# Patient Record
Sex: Female | Born: 1965 | Race: Black or African American | Hispanic: No | Smoking: Never smoker
Health system: Southern US, Community
[De-identification: ages and names within clinical notes are randomized; demographics above are authoritative.]

## PROBLEM LIST (undated history)

## (undated) DIAGNOSIS — I639 Cerebral infarction, unspecified: Secondary | ICD-10-CM

## (undated) DIAGNOSIS — M5136 Other intervertebral disc degeneration, lumbar region: Secondary | ICD-10-CM

## (undated) DIAGNOSIS — I341 Nonrheumatic mitral (valve) prolapse: Secondary | ICD-10-CM

## (undated) DIAGNOSIS — Q211 Atrial septal defect: Secondary | ICD-10-CM

## (undated) DIAGNOSIS — R51 Headache: Secondary | ICD-10-CM

## (undated) DIAGNOSIS — R42 Dizziness and giddiness: Secondary | ICD-10-CM

## (undated) DIAGNOSIS — K458 Other specified abdominal hernia without obstruction or gangrene: Secondary | ICD-10-CM

## (undated) DIAGNOSIS — M5030 Other cervical disc degeneration, unspecified cervical region: Secondary | ICD-10-CM

## (undated) DIAGNOSIS — M722 Plantar fascial fibromatosis: Secondary | ICD-10-CM

## (undated) DIAGNOSIS — R519 Headache, unspecified: Secondary | ICD-10-CM

## (undated) DIAGNOSIS — I219 Acute myocardial infarction, unspecified: Secondary | ICD-10-CM

## (undated) DIAGNOSIS — D573 Sickle-cell trait: Secondary | ICD-10-CM

## (undated) DIAGNOSIS — T753XXA Motion sickness, initial encounter: Secondary | ICD-10-CM

## (undated) DIAGNOSIS — R0789 Other chest pain: Secondary | ICD-10-CM

## (undated) DIAGNOSIS — K562 Volvulus: Secondary | ICD-10-CM

## (undated) DIAGNOSIS — M1990 Unspecified osteoarthritis, unspecified site: Secondary | ICD-10-CM

## (undated) DIAGNOSIS — R109 Unspecified abdominal pain: Secondary | ICD-10-CM

## (undated) DIAGNOSIS — D649 Anemia, unspecified: Secondary | ICD-10-CM

## (undated) DIAGNOSIS — N83209 Unspecified ovarian cyst, unspecified side: Secondary | ICD-10-CM

## (undated) HISTORY — DX: Other cervical disc degeneration, unspecified cervical region: M50.30

## (undated) HISTORY — DX: Dizziness and giddiness: R42

## (undated) HISTORY — DX: Volvulus: K56.2

## (undated) HISTORY — DX: Other chest pain: R07.89

## (undated) HISTORY — DX: Other intervertebral disc degeneration, lumbar region: M51.36

## (undated) HISTORY — PX: OTHER SURGICAL HISTORY: SHX169

## (undated) HISTORY — DX: Unspecified ovarian cyst, unspecified side: N83.209

## (undated) HISTORY — DX: Atrial septal defect: Q21.1

## (undated) HISTORY — PX: CARDIAC CATHETERIZATION: SHX172

## (undated) HISTORY — DX: Other specified abdominal hernia without obstruction or gangrene: K45.8

## (undated) HISTORY — DX: Unspecified abdominal pain: R10.9

---

## 2011-06-17 ENCOUNTER — Emergency Department: Payer: Self-pay | Admitting: Emergency Medicine

## 2011-06-17 LAB — PREGNANCY, URINE: Pregnancy Test, Urine: NEGATIVE m[IU]/mL

## 2011-11-18 ENCOUNTER — Emergency Department: Payer: Self-pay | Admitting: Emergency Medicine

## 2012-06-08 HISTORY — PX: ENDOMETRIAL ABLATION: SHX621

## 2012-09-28 ENCOUNTER — Emergency Department: Payer: Self-pay | Admitting: Emergency Medicine

## 2012-11-23 ENCOUNTER — Ambulatory Visit: Payer: Self-pay | Admitting: Family Medicine

## 2013-02-28 ENCOUNTER — Ambulatory Visit: Payer: Self-pay | Admitting: Hematology and Oncology

## 2013-02-28 LAB — CBC CANCER CENTER
Basophil %: 0.5 %
Eosinophil #: 0.1 x10 3/mm (ref 0.0–0.7)
Eosinophil %: 2.3 %
Lymphocyte %: 31.5 %
MCV: 71 fL — ABNORMAL LOW (ref 80–100)
Monocyte #: 0.3 x10 3/mm (ref 0.2–0.9)
Monocyte %: 8.4 %
Neutrophil #: 2.2 x10 3/mm (ref 1.4–6.5)
Platelet: 245 x10 3/mm (ref 150–440)
RDW: 19.2 % — ABNORMAL HIGH (ref 11.5–14.5)
WBC: 3.9 x10 3/mm (ref 3.6–11.0)

## 2013-02-28 LAB — IRON AND TIBC
Iron Bind.Cap.(Total): 448 ug/dL (ref 250–450)
Iron Saturation: 6 %
Unbound Iron-Bind.Cap.: 423 ug/dL

## 2013-02-28 LAB — BASIC METABOLIC PANEL
Anion Gap: 2 — ABNORMAL LOW (ref 7–16)
Calcium, Total: 8.6 mg/dL (ref 8.5–10.1)
Co2: 29 mmol/L (ref 21–32)
EGFR (African American): >60
EGFR (Non-African Amer.): >60
Glucose: 82 mg/dL (ref 65–99)
Potassium: 4 mmol/L (ref 3.5–5.1)
Sodium: 137 mmol/L (ref 136–145)

## 2013-03-08 ENCOUNTER — Ambulatory Visit: Payer: Self-pay | Admitting: Hematology and Oncology

## 2013-03-14 LAB — CBC CANCER CENTER
Basophil #: 0 x10 3/mm (ref 0.0–0.1)
Eosinophil #: 0.1 x10 3/mm (ref 0.0–0.7)
Eosinophil %: 1.1 %
HCT: 30.4 % — ABNORMAL LOW (ref 35.0–47.0)
HGB: 9.9 g/dL — ABNORMAL LOW (ref 12.0–16.0)
Lymphocyte #: 1.3 x10 3/mm (ref 1.0–3.6)
MCH: 23.3 pg — ABNORMAL LOW (ref 26.0–34.0)
MCHC: 32.4 g/dL (ref 32.0–36.0)
MCV: 72 fL — ABNORMAL LOW (ref 80–100)
Monocyte #: 0.4 x10 3/mm (ref 0.2–0.9)
Monocyte %: 8.5 %
Neutrophil #: 2.8 x10 3/mm (ref 1.4–6.5)
Platelet: 215 x10 3/mm (ref 150–440)
RBC: 4.22 10*6/uL (ref 3.80–5.20)
RDW: 18.6 % — ABNORMAL HIGH (ref 11.5–14.5)
WBC: 4.6 x10 3/mm (ref 3.6–11.0)

## 2013-04-08 ENCOUNTER — Ambulatory Visit: Payer: Self-pay | Admitting: Hematology and Oncology

## 2013-04-12 LAB — CBC CANCER CENTER
Basophil #: 0 x10 3/mm (ref 0.0–0.1)
Basophil %: 0.4 %
Eosinophil #: 0 x10 3/mm (ref 0.0–0.7)
Eosinophil %: 1 %
HGB: 11.4 g/dL — ABNORMAL LOW (ref 12.0–16.0)
Lymphocyte #: 1 x10 3/mm (ref 1.0–3.6)
MCH: 23.7 pg — ABNORMAL LOW (ref 26.0–34.0)
MCHC: 31.6 g/dL — ABNORMAL LOW (ref 32.0–36.0)
MCV: 75 fL — ABNORMAL LOW (ref 80–100)
Monocyte %: 7.5 %
Neutrophil #: 2.9 x10 3/mm (ref 1.4–6.5)
Neutrophil %: 68.9 %
Platelet: 166 x10 3/mm (ref 150–440)
RDW: 20.1 % — ABNORMAL HIGH (ref 11.5–14.5)
WBC: 4.3 x10 3/mm (ref 3.6–11.0)

## 2013-04-12 LAB — IRON AND TIBC
Iron Saturation: 37 %
Iron: 106 ug/dL (ref 50–170)

## 2013-04-12 LAB — FERRITIN: Ferritin (ARMC): 194 ng/mL (ref 8–388)

## 2013-05-08 ENCOUNTER — Ambulatory Visit: Payer: Self-pay | Admitting: Hematology and Oncology

## 2013-05-10 ENCOUNTER — Ambulatory Visit: Payer: Self-pay | Admitting: Hematology and Oncology

## 2013-05-10 LAB — CBC CANCER CENTER
Eosinophil %: 1 %
HGB: 12.5 g/dL (ref 12.0–16.0)
Lymphocyte %: 26.8 %
MCH: 24.3 pg — ABNORMAL LOW (ref 26.0–34.0)
MCHC: 31.8 g/dL — ABNORMAL LOW (ref 32.0–36.0)
Monocyte %: 8.8 %
Neutrophil #: 2.9 x10 3/mm (ref 1.4–6.5)
Neutrophil %: 62.9 %
Platelet: 210 x10 3/mm (ref 150–440)
RBC: 5.15 10*6/uL (ref 3.80–5.20)
RDW: 18.1 % — ABNORMAL HIGH (ref 11.5–14.5)
WBC: 4.7 x10 3/mm (ref 3.6–11.0)

## 2013-06-01 ENCOUNTER — Emergency Department: Payer: Self-pay | Admitting: Internal Medicine

## 2013-06-01 LAB — COMPREHENSIVE METABOLIC PANEL
Albumin: 3.5 g/dL (ref 3.4–5.0)
Alkaline Phosphatase: 84 U/L
Calcium, Total: 8.6 mg/dL (ref 8.5–10.1)
Chloride: 105 mmol/L (ref 98–107)
Co2: 30 mmol/L (ref 21–32)
EGFR (African American): >60
Glucose: 94 mg/dL (ref 65–99)
Osmolality: 273 (ref 275–301)
SGOT(AST): 24 U/L (ref 15–37)
SGPT (ALT): 23 U/L (ref 12–78)
Total Protein: 7.5 g/dL (ref 6.4–8.2)

## 2013-06-01 LAB — URINALYSIS, COMPLETE
Bacteria: NONE SEEN
Bilirubin,UR: NEGATIVE
Glucose,UR: NEGATIVE mg/dL (ref 0–75)
Ph: 6 (ref 4.5–8.0)
RBC,UR: 3 /HPF (ref 0–5)
Squamous Epithelial: 7
WBC UR: 32 /HPF (ref 0–5)

## 2013-06-01 LAB — CBC
HCT: 38.6 % (ref 35.0–47.0)
HGB: 12.4 g/dL (ref 12.0–16.0)
MCH: 24.3 pg — ABNORMAL LOW (ref 26.0–34.0)
Platelet: 228 10*3/uL (ref 150–440)
RBC: 5.11 10*6/uL (ref 3.80–5.20)
RDW: 16.3 % — ABNORMAL HIGH (ref 11.5–14.5)

## 2013-06-01 LAB — PROTIME-INR: INR: 0.9

## 2013-06-08 ENCOUNTER — Ambulatory Visit: Payer: Self-pay | Admitting: Hematology and Oncology

## 2013-06-21 ENCOUNTER — Emergency Department: Payer: Self-pay | Admitting: Internal Medicine

## 2013-06-21 LAB — LIPASE, BLOOD: Lipase: 1201 U/L — ABNORMAL HIGH (ref 73–393)

## 2013-06-21 LAB — CBC WITH DIFFERENTIAL/PLATELET
Basophil #: 0 10*3/uL (ref 0.0–0.1)
Basophil %: 0.2 %
Eosinophil #: 0 10*3/uL (ref 0.0–0.7)
Eosinophil %: 0.1 %
HCT: 41.1 % (ref 35.0–47.0)
HGB: 13.2 g/dL (ref 12.0–16.0)
Lymphocyte #: 0.6 10*3/uL — ABNORMAL LOW (ref 1.0–3.6)
Lymphocyte %: 3.9 %
MCH: 24.3 pg — ABNORMAL LOW (ref 26.0–34.0)
MCHC: 32.1 g/dL (ref 32.0–36.0)
MCV: 76 fL — ABNORMAL LOW (ref 80–100)
MONOS PCT: 5.1 %
Monocyte #: 0.8 x10 3/mm (ref 0.2–0.9)
NEUTROS ABS: 14.1 10*3/uL — AB (ref 1.4–6.5)
NEUTROS PCT: 90.7 %
PLATELETS: 175 10*3/uL (ref 150–440)
RBC: 5.43 10*6/uL — AB (ref 3.80–5.20)
RDW: 14.9 % — AB (ref 11.5–14.5)
WBC: 15.5 10*3/uL — ABNORMAL HIGH (ref 3.6–11.0)

## 2013-06-21 LAB — URINALYSIS, COMPLETE
BACTERIA: NONE SEEN
BILIRUBIN, UR: NEGATIVE
Blood: NEGATIVE
GLUCOSE, UR: NEGATIVE mg/dL (ref 0–75)
Ketone: NEGATIVE
LEUKOCYTE ESTERASE: NEGATIVE
NITRITE: NEGATIVE
PH: 5 (ref 4.5–8.0)
Protein: NEGATIVE
Specific Gravity: 1.021 (ref 1.003–1.030)
Squamous Epithelial: <1
WBC UR: 1 /HPF (ref 0–5)

## 2013-06-21 LAB — COMPREHENSIVE METABOLIC PANEL
ANION GAP: 6 — AB (ref 7–16)
AST: 32 U/L (ref 15–37)
Albumin: 3.8 g/dL (ref 3.4–5.0)
Alkaline Phosphatase: 84 U/L
BILIRUBIN TOTAL: 0.3 mg/dL (ref 0.2–1.0)
BUN: 17 mg/dL (ref 7–18)
CALCIUM: 9 mg/dL (ref 8.5–10.1)
CO2: 22 mmol/L (ref 21–32)
Chloride: 107 mmol/L (ref 98–107)
Creatinine: 0.7 mg/dL (ref 0.60–1.30)
EGFR (African American): >60
Glucose: 132 mg/dL — ABNORMAL HIGH (ref 65–99)
Osmolality: 274 (ref 275–301)
Potassium: 4.3 mmol/L (ref 3.5–5.1)
SGPT (ALT): 21 U/L (ref 12–78)
Sodium: 135 mmol/L — ABNORMAL LOW (ref 136–145)
Total Protein: 7.9 g/dL (ref 6.4–8.2)

## 2013-06-21 LAB — RAPID INFLUENZA A&B ANTIGENS

## 2013-08-19 ENCOUNTER — Emergency Department: Payer: Self-pay | Admitting: Emergency Medicine

## 2014-01-01 ENCOUNTER — Emergency Department: Payer: Self-pay | Admitting: Emergency Medicine

## 2014-01-01 LAB — CBC
HCT: 38.2 % (ref 35.0–47.0)
HGB: 12.1 g/dL (ref 12.0–16.0)
MCH: 24 pg — ABNORMAL LOW (ref 26.0–34.0)
MCHC: 31.6 g/dL — ABNORMAL LOW (ref 32.0–36.0)
MCV: 76 fL — ABNORMAL LOW (ref 80–100)
PLATELETS: 198 10*3/uL (ref 150–440)
RBC: 5.04 10*6/uL (ref 3.80–5.20)
RDW: 14.7 % — ABNORMAL HIGH (ref 11.5–14.5)
WBC: 4 10*3/uL (ref 3.6–11.0)

## 2014-01-01 LAB — COMPREHENSIVE METABOLIC PANEL
ANION GAP: 5 — AB (ref 7–16)
Albumin: 3.4 g/dL (ref 3.4–5.0)
Alkaline Phosphatase: 70 U/L
BUN: 9 mg/dL (ref 7–18)
Bilirubin,Total: 0.3 mg/dL (ref 0.2–1.0)
CALCIUM: 8.5 mg/dL (ref 8.5–10.1)
CO2: 28 mmol/L (ref 21–32)
Chloride: 105 mmol/L (ref 98–107)
Creatinine: 0.82 mg/dL (ref 0.60–1.30)
EGFR (African American): >60
EGFR (Non-African Amer.): >60
GLUCOSE: 96 mg/dL (ref 65–99)
OSMOLALITY: 274 (ref 275–301)
POTASSIUM: 3.7 mmol/L (ref 3.5–5.1)
SGOT(AST): 22 U/L (ref 15–37)
SGPT (ALT): 23 U/L
Sodium: 138 mmol/L (ref 136–145)
Total Protein: 7.3 g/dL (ref 6.4–8.2)

## 2014-01-01 LAB — URINALYSIS, COMPLETE
BILIRUBIN, UR: NEGATIVE
Bacteria: NONE SEEN
Blood: NEGATIVE
Glucose,UR: NEGATIVE mg/dL (ref 0–75)
Ketone: NEGATIVE
Nitrite: NEGATIVE
PH: 5 (ref 4.5–8.0)
Protein: NEGATIVE
Specific Gravity: 1.017 (ref 1.003–1.030)
Squamous Epithelial: 6
WBC UR: 5 /HPF (ref 0–5)

## 2014-01-01 LAB — HCG, QUANTITATIVE, PREGNANCY: Beta Hcg, Quant.: <1 m[IU]/mL — ABNORMAL LOW

## 2014-08-11 ENCOUNTER — Emergency Department: Payer: Self-pay | Admitting: Emergency Medicine

## 2014-08-29 ENCOUNTER — Emergency Department: Payer: Self-pay | Admitting: Emergency Medicine

## 2015-04-11 ENCOUNTER — Ambulatory Visit: Payer: Self-pay | Admitting: Podiatry

## 2016-01-28 DIAGNOSIS — Q2111 Secundum atrial septal defect: Secondary | ICD-10-CM

## 2016-01-28 DIAGNOSIS — Q211 Atrial septal defect: Secondary | ICD-10-CM

## 2016-01-28 DIAGNOSIS — R0789 Other chest pain: Secondary | ICD-10-CM

## 2016-01-28 HISTORY — DX: Secundum atrial septal defect: Q21.11

## 2016-01-28 HISTORY — DX: Atrial septal defect: Q21.1

## 2016-01-28 HISTORY — DX: Other chest pain: R07.89

## 2016-06-26 ENCOUNTER — Encounter: Payer: Self-pay | Admitting: Emergency Medicine

## 2016-06-26 DIAGNOSIS — R112 Nausea with vomiting, unspecified: Secondary | ICD-10-CM | POA: Diagnosis present

## 2016-06-26 DIAGNOSIS — K859 Acute pancreatitis without necrosis or infection, unspecified: Secondary | ICD-10-CM | POA: Diagnosis not present

## 2016-06-26 LAB — CBC
HCT: 39.2 % (ref 35.0–47.0)
HEMOGLOBIN: 12.5 g/dL (ref 12.0–16.0)
MCH: 23.6 pg — AB (ref 26.0–34.0)
MCHC: 31.9 g/dL — AB (ref 32.0–36.0)
MCV: 74 fL — ABNORMAL LOW (ref 80.0–100.0)
Platelets: 236 10*3/uL (ref 150–440)
RBC: 5.3 MIL/uL — ABNORMAL HIGH (ref 3.80–5.20)
RDW: 15.3 % — ABNORMAL HIGH (ref 11.5–14.5)
WBC: 11.4 10*3/uL — ABNORMAL HIGH (ref 3.6–11.0)

## 2016-06-26 LAB — COMPREHENSIVE METABOLIC PANEL
ALBUMIN: 4.1 g/dL (ref 3.5–5.0)
ALK PHOS: 87 U/L (ref 38–126)
ALT: 16 U/L (ref 14–54)
ANION GAP: 6 (ref 5–15)
AST: 19 U/L (ref 15–41)
BUN: 15 mg/dL (ref 6–20)
CALCIUM: 9 mg/dL (ref 8.9–10.3)
CHLORIDE: 108 mmol/L (ref 101–111)
CO2: 24 mmol/L (ref 22–32)
Creatinine, Ser: 0.6 mg/dL (ref 0.44–1.00)
GFR calc Af Amer: >60 mL/min (ref >60)
GFR calc non Af Amer: >60 mL/min (ref >60)
GLUCOSE: 133 mg/dL — AB (ref 65–99)
Potassium: 3.8 mmol/L (ref 3.5–5.1)
SODIUM: 138 mmol/L (ref 135–145)
Total Bilirubin: 0.4 mg/dL (ref 0.3–1.2)
Total Protein: 7.7 g/dL (ref 6.5–8.1)

## 2016-06-26 LAB — URINALYSIS, COMPLETE (UACMP) WITH MICROSCOPIC
Bilirubin Urine: NEGATIVE
GLUCOSE, UA: NEGATIVE mg/dL
KETONES UR: NEGATIVE mg/dL
Leukocytes, UA: NEGATIVE
Nitrite: NEGATIVE
PROTEIN: NEGATIVE mg/dL
Specific Gravity, Urine: 1.029 (ref 1.005–1.030)
pH: 5 (ref 5.0–8.0)

## 2016-06-26 LAB — LIPASE, BLOOD: Lipase: 132 U/L — ABNORMAL HIGH (ref 11–51)

## 2016-06-26 MED ORDER — ONDANSETRON 4 MG PO TBDP
4.0000 mg | ORAL_TABLET | Freq: Once | ORAL | Status: AC | PRN
  Administered 2016-06-26: 4 mg via ORAL

## 2016-06-26 MED ORDER — ONDANSETRON 4 MG PO TBDP
ORAL_TABLET | ORAL | Status: AC
  Filled 2016-06-26: qty 1

## 2016-06-26 NOTE — ED Triage Notes (Signed)
Patient reports eating dinner around 18:00. Patient states that about 15 minutes after eating she developed generalized abdominal pain, vomiting and diarrhea.

## 2016-06-27 ENCOUNTER — Emergency Department
Admission: EM | Admit: 2016-06-27 | Discharge: 2016-06-27 | Disposition: A | Discharge status: Home / Self Care | Payer: Medicaid Other | Attending: Emergency Medicine | Admitting: Emergency Medicine

## 2016-06-27 ENCOUNTER — Emergency Department: Payer: Medicaid Other

## 2016-06-27 DIAGNOSIS — R112 Nausea with vomiting, unspecified: Secondary | ICD-10-CM

## 2016-06-27 DIAGNOSIS — K859 Acute pancreatitis without necrosis or infection, unspecified: Secondary | ICD-10-CM

## 2016-06-27 DIAGNOSIS — R1013 Epigastric pain: Secondary | ICD-10-CM

## 2016-06-27 DIAGNOSIS — R197 Diarrhea, unspecified: Secondary | ICD-10-CM

## 2016-06-27 HISTORY — DX: Nonrheumatic mitral (valve) prolapse: I34.1

## 2016-06-27 HISTORY — DX: Unspecified osteoarthritis, unspecified site: M19.90

## 2016-06-27 HISTORY — DX: Anemia, unspecified: D64.9

## 2016-06-27 HISTORY — DX: Sickle-cell trait: D57.3

## 2016-06-27 MED ORDER — ONDANSETRON HCL 4 MG/2ML IJ SOLN
4.0000 mg | Freq: Once | INTRAMUSCULAR | Status: AC
  Administered 2016-06-27: 4 mg via INTRAVENOUS
  Filled 2016-06-27: qty 2

## 2016-06-27 MED ORDER — ONDANSETRON 4 MG PO TBDP: 4.0000 mg | ORAL_TABLET | Freq: Three times a day (TID) | ORAL | 0 refills | Status: DC | PRN

## 2016-06-27 MED ORDER — MORPHINE SULFATE (PF) 4 MG/ML IV SOLN
4.0000 mg | Freq: Once | INTRAVENOUS | Status: AC
  Administered 2016-06-27: 4 mg via INTRAVENOUS
  Filled 2016-06-27: qty 1

## 2016-06-27 MED ORDER — SODIUM CHLORIDE 0.9 % IV BOLUS (SEPSIS)
1000.0000 mL | Freq: Once | INTRAVENOUS | Status: AC
  Administered 2016-06-27: 1000 mL via INTRAVENOUS

## 2016-06-27 MED ORDER — OXYCODONE-ACETAMINOPHEN 5-325 MG PO TABS: 1.0000 | ORAL_TABLET | ORAL | 0 refills | Status: DC | PRN

## 2016-06-27 NOTE — ED Notes (Signed)
Patient states she has a hx of 2 cardiac stents and a stroke, EKG obtained and MD notified.

## 2016-06-27 NOTE — ED Notes (Signed)
ED Provider at bedside. 

## 2016-06-27 NOTE — Discharge Instructions (Signed)
1. You may take pain and nausea medicines as needed (Percocet/Zofran #20). 2. Clear liquids 12 hours, then bland diet 5 days, then slowly advance diet as tolerated. Avoid fatty, greasy, spicy foods and alcohol. 3. Return to the ER for worsening symptoms, persistent vomiting, difficulty breathing or other concerns.

## 2016-06-27 NOTE — ED Notes (Signed)
Patient transported to Ultrasound 

## 2016-06-27 NOTE — ED Provider Notes (Signed)
[facility] Emergency Department Provider Note   ____________________________________________   First MD Initiated Contact with Patient 06/27/16 0221     (approximate)  I have reviewed the triage vital signs and the nursing notes.   HISTORY  Chief Complaint Abdominal Pain; Emesis; and Diarrhea    HPI Pam Snyder is a 51 y.o. female who presents to the ED from home with a chief complaint of abdominal pain, nausea/vomiting/diarrhea. Patient reports eating [employer] at approximately 6 PM; about 15 minutes after eating she developed generalized abdominal pain, vomiting and diarrhea.Denies associated fever, chills, chest pain, shortness of breath, dysuria. Denies recent travel or trauma. Nothing makes her symptoms better or worse.   Past Medical History:  Diagnosis Date  . Anemia   . Arthritis   . Mitral valve prolapse   . Sickle cell trait ([place])     There are no active problems to display for this patient.   Past Surgical History:  Procedure Laterality Date  . fibroid cyst      Prior to Admission medications   Not on File    Allergies Patient has no known allergies.  No family history on file.  Social History Social History  Substance Use Topics  . Smoking status: Never Smoker  . Smokeless tobacco: Never Used  . Alcohol use Not on file    Review of Systems  Constitutional: No fever/chills. Eyes: No visual changes. ENT: No sore throat. Cardiovascular: Denies chest pain. Respiratory: Denies shortness of breath. Gastrointestinal: Positive for abdominal pain, nausea, vomiting and diarrhea.  No constipation. Genitourinary: Negative for dysuria. Musculoskeletal: Negative for back pain. Skin: Negative for rash. Neurological: Negative for headaches, focal weakness or numbness.  10-point ROS otherwise negative.  ____________________________________________   PHYSICAL EXAM:  VITAL SIGNS: ED Triage Vitals  Enc Vitals  Group     BP 06/26/16 2318 122/62     Pulse Rate 06/26/16 2318 97     Resp 06/26/16 2318 18     Temp 06/26/16 2318 98.2 F (36.8 C)     Temp Source 06/26/16 2318 Oral     SpO2 06/26/16 2318 98 %     Weight 06/26/16 2318 182 lb (82.6 kg)     Height 06/26/16 2318 5\' 8"  (1.727 m)     Head Circumference --      Peak Flow --      Pain Score 06/26/16 2319 8     Pain Loc --      Pain Edu? --      Excl. in [place]? --     Constitutional: Asleep, awakened for exam. Alert and oriented. Well appearing and in no acute distress. Eyes: Conjunctivae are normal. PERRL. EOMI. Head: Atraumatic. Nose: No congestion/rhinnorhea. Mouth/Throat: Mucous membranes are moist.  Oropharynx non-erythematous. Neck: No stridor.   Cardiovascular: Normal rate, regular rhythm. Grossly normal heart sounds.  Good peripheral circulation. Respiratory: Normal respiratory effort.  No retractions. Lungs CTAB. Gastrointestinal: Soft and mildly tender to palpation epigastrium without rebound or guarding. No distention. No abdominal bruits. No CVA tenderness. Musculoskeletal: No lower extremity tenderness nor edema.  No joint effusions. Neurologic:  Normal speech and language. No gross focal neurologic deficits are appreciated. No gait instability. Skin:  Skin is warm, dry and intact. No rash noted. Psychiatric: Mood and affect are normal. Speech and behavior are normal.  ____________________________________________   LABS (all labs ordered are listed, but only abnormal results are displayed)  Labs Reviewed  LIPASE, BLOOD - Abnormal; Notable for the following:  Result Value   Lipase 132 (*)    All other components within normal limits  COMPREHENSIVE METABOLIC PANEL - Abnormal; Notable for the following:    Glucose, Bld 133 (*)    All other components within normal limits  CBC - Abnormal; Notable for the following:    WBC 11.4 (*)    RBC 5.30 (*)    MCV 74.0 (*)    MCH 23.6 (*)    MCHC 31.9 (*)    RDW 15.3 (*)     All other components within normal limits  URINALYSIS, COMPLETE (UACMP) WITH MICROSCOPIC - Abnormal; Notable for the following:    Color, Urine YELLOW (*)    APPearance CLEAR (*)    Hgb urine dipstick SMALL (*)    Bacteria, UA RARE (*)    Squamous Epithelial / LPF 0-5 (*)    All other components within normal limits   ____________________________________________  EKG  ED ECG REPORT I, Jeffey Janssen J, the attending physician, personally viewed and interpreted this ECG.   Date: 06/27/2016  EKG Time: 0159  Rate: 73  Rhythm: normal EKG, normal sinus rhythm  Axis: Normal  Intervals:none  ST&T Change: T-wave inversion inferior laterally No significant change from 01/01/2014 ____________________________________________  RADIOLOGY  Ultrasound interpreted per Dr. Francoise Ceo: Negative right upper quadrant abdominal ultrasound ____________________________________________   PROCEDURES  Procedure(s) performed: None  Procedures  Critical Care performed: No  ____________________________________________   INITIAL IMPRESSION / ASSESSMENT AND PLAN / ED COURSE  Pertinent labs & imaging results that were available during my care of the patient were reviewed by me and considered in my medical decision making (see chart for details).  51 year old female who presents with epigastric pain, vomiting and diarrhea after eating [employer]. Prior history of pancreatitis. Laboratory urinalysis results remarkable for elevated lipase with normal LFTs. Will administer IV fluids, analgesia and proceed with ultrasound to evaluate for cholecystitis.  Clinical Course as of Jun 27 422  Sat Jun 27, 2016  [id number] Patient asleep. Awakened to update her of negative ultrasound results. She has tolerated  PO without emesis. No diarrhea while in the emergency department. PRN Percocet and Zofran as needed for pancreatitis, and encourage patient to follow-up with her PCP early next week. Strict return precautions  given. Patient verbalizes understanding and agrees with plan of care.  [JS]    Clinical Course User Index [JS] Paulette Blanch, MD     ____________________________________________   FINAL CLINICAL IMPRESSION(S) / ED DIAGNOSES  Final diagnoses:  Nausea vomiting and diarrhea  Epigastric pain  Acute pancreatitis, unspecified complication status, unspecified pancreatitis type      NEW MEDICATIONS STARTED DURING THIS VISIT:  New Prescriptions   No medications on file     Note:  This document was prepared using Dragon voice recognition software and may include unintentional dictation errors.    Paulette Blanch, MD 06/27/16 [phone]

## 2016-08-01 ENCOUNTER — Encounter: Payer: Self-pay | Admitting: Emergency Medicine

## 2016-08-01 ENCOUNTER — Emergency Department: Payer: Medicaid Other

## 2016-08-01 ENCOUNTER — Emergency Department
Admission: EM | Admit: 2016-08-01 | Discharge: 2016-08-01 | Disposition: A | Discharge status: Home / Self Care | Payer: Medicaid Other | Attending: Emergency Medicine | Admitting: Emergency Medicine

## 2016-08-01 DIAGNOSIS — H9201 Otalgia, right ear: Secondary | ICD-10-CM | POA: Diagnosis present

## 2016-08-01 DIAGNOSIS — H6501 Acute serous otitis media, right ear: Secondary | ICD-10-CM | POA: Insufficient documentation

## 2016-08-01 DIAGNOSIS — R51 Headache: Secondary | ICD-10-CM | POA: Diagnosis not present

## 2016-08-01 DIAGNOSIS — R791 Abnormal coagulation profile: Secondary | ICD-10-CM | POA: Diagnosis not present

## 2016-08-01 HISTORY — DX: Cerebral infarction, unspecified: I63.9

## 2016-08-01 LAB — COMPREHENSIVE METABOLIC PANEL
ALK PHOS: 85 U/L (ref 38–126)
ALT: 15 U/L (ref 14–54)
ANION GAP: 7 (ref 5–15)
AST: 18 U/L (ref 15–41)
Albumin: 4.1 g/dL (ref 3.5–5.0)
BUN: 11 mg/dL (ref 6–20)
CALCIUM: 8.8 mg/dL — AB (ref 8.9–10.3)
CO2: 26 mmol/L (ref 22–32)
Chloride: 105 mmol/L (ref 101–111)
Creatinine, Ser: 0.84 mg/dL (ref 0.44–1.00)
Glucose, Bld: 125 mg/dL — ABNORMAL HIGH (ref 65–99)
Potassium: 3.7 mmol/L (ref 3.5–5.1)
Sodium: 138 mmol/L (ref 135–145)
Total Bilirubin: 0.2 mg/dL — ABNORMAL LOW (ref 0.3–1.2)
Total Protein: 7.6 g/dL (ref 6.5–8.1)

## 2016-08-01 LAB — DIFFERENTIAL
Basophils Absolute: 0 10*3/uL (ref 0–0.1)
Basophils Relative: 0 %
EOS PCT: 2 %
Eosinophils Absolute: 0.1 10*3/uL (ref 0–0.7)
LYMPHS ABS: 1.4 10*3/uL (ref 1.0–3.6)
LYMPHS PCT: 35 %
MONO ABS: 0.4 10*3/uL (ref 0.2–0.9)
MONOS PCT: 9 %
Neutro Abs: 2.1 10*3/uL (ref 1.4–6.5)
Neutrophils Relative %: 54 %

## 2016-08-01 LAB — TROPONIN I: Troponin I: <0.03 ng/mL (ref <0.03)

## 2016-08-01 LAB — CBC
HCT: 37.6 % (ref 35.0–47.0)
HEMOGLOBIN: 12.1 g/dL (ref 12.0–16.0)
MCH: 23.9 pg — AB (ref 26.0–34.0)
MCHC: 32.2 g/dL (ref 32.0–36.0)
MCV: 74.1 fL — AB (ref 80.0–100.0)
PLATELETS: 240 10*3/uL (ref 150–440)
RBC: 5.07 MIL/uL (ref 3.80–5.20)
RDW: 15.5 % — ABNORMAL HIGH (ref 11.5–14.5)
WBC: 3.9 10*3/uL (ref 3.6–11.0)

## 2016-08-01 LAB — PROTIME-INR
INR: 0.93
PROTHROMBIN TIME: 12.5 s (ref 11.4–15.2)

## 2016-08-01 LAB — APTT: aPTT: 28 seconds (ref 24–36)

## 2016-08-01 LAB — GLUCOSE, CAPILLARY: Glucose-Capillary: 117 mg/dL — ABNORMAL HIGH (ref 65–99)

## 2016-08-01 MED ORDER — LIDOCAINE HCL (PF) 1 % IJ SOLN
5.0000 mL | Freq: Once | INTRAMUSCULAR | Status: AC
  Administered 2016-08-01: 5 mL

## 2016-08-01 MED ORDER — AMOXICILLIN 500 MG PO CAPS
1000.0000 mg | ORAL_CAPSULE | Freq: Once | ORAL | Status: AC
  Administered 2016-08-01: 1000 mg via ORAL
  Filled 2016-08-01: qty 2

## 2016-08-01 MED ORDER — AMOXICILLIN 500 MG PO TABS: 1000.0000 mg | ORAL_TABLET | Freq: Two times a day (BID) | ORAL | 0 refills | Status: AC

## 2016-08-01 MED ORDER — IBUPROFEN 600 MG PO TABS
600.0000 mg | ORAL_TABLET | Freq: Once | ORAL | Status: AC
  Administered 2016-08-01: 600 mg via ORAL
  Filled 2016-08-01: qty 1

## 2016-08-01 MED ORDER — LIDOCAINE HCL (PF) 1 % IJ SOLN
INTRAMUSCULAR | Status: AC
  Filled 2016-08-01: qty 5

## 2016-08-01 MED ORDER — LIDOCAINE HCL (PF) 4 % IJ SOLN
4.0000 mL | Freq: Once | INTRAMUSCULAR | Status: DC
  Filled 2016-08-01: qty 5

## 2016-08-01 MED ORDER — ACETAMINOPHEN 500 MG PO TABS
1000.0000 mg | ORAL_TABLET | Freq: Once | ORAL | Status: AC
  Administered 2016-08-01: 1000 mg via ORAL
  Filled 2016-08-01: qty 2

## 2016-08-01 NOTE — ED Triage Notes (Signed)
Pt states she has had a headache on the right side of her head for 3 days, states it is worsening. Lips numb as well that started around 2pm. Pt has hx of migraines, but states it doesn't feel like one. Pt also had CVA in 2003 when pregnant. No deficits noted. +light sensitivity, no nausea. Hx of mitral valve prolapse.

## 2016-08-01 NOTE — Discharge Instructions (Signed)
Please take all of your antibiotics as prescribed. Return to the emergency department sooner for any new or worsening symptoms such as worsening pain, fevers, chills, or for any other concerns.  Otherwise follow-up with your primary care physician as needed.

## 2016-08-01 NOTE — ED Notes (Signed)
D/w Dr. Jimmye Norman patient's complaint. With her hx of CVA and presenting sxs, run Stroke order sets, but do not call code stroke since symptoms began several days ago.

## 2016-08-01 NOTE — ED Provider Notes (Signed)
[facility] Emergency Department Provider Note  ____________________________________________   First MD Initiated Contact with Patient 08/01/16 1755     (approximate)  I have reviewed the triage vital signs and the nursing notes.   HISTORY  Chief Complaint Headache and Numbness (lips)    HPI Pam Snyder is a 51 y.o. female who comes to the emergency department with 3 days of aching in her right ear and gradual onset not maximal onset right temporal headache unlike any headache she's ever had before. She does have a history of migraines but this feels different. 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 She denies discharge in her ear.   Past Medical History:  Diagnosis Date  . Anemia   . Arthritis   . Mitral valve prolapse   . Sickle cell trait ([place])   . Stroke [facility])     There are no active problems to display for this patient.   Past Surgical History:  Procedure Laterality Date  . CARDIAC CATHETERIZATION     x3 last in 2014  . fibroid cyst      Prior to Admission medications   Medication Sig Start Date End Date Taking? Authorizing Provider  amoxicillin (AMOXIL) 500 MG tablet Take 2 tablets (1,000 mg total) by mouth 2 (two) times daily. 08/01/16 08/08/16  Darel Hong, MD  ondansetron (ZOFRAN ODT) 4 MG disintegrating tablet Take 1 tablet (4 mg total) by mouth every 8 (eight) hours as needed for nausea or vomiting. Patient not taking: Reported on 08/01/2016 06/27/16   Paulette Blanch, MD  oxyCODONE-acetaminophen (ROXICET) 5-325 MG tablet Take 1 tablet by mouth every 4 (four) hours as needed for severe pain. Patient not taking: Reported on 08/01/2016 06/27/16   Paulette Blanch, MD    Allergies Patient has no known allergies.  History reviewed. No pertinent family history.  Social History Social History  Substance Use Topics  . Smoking status: Never  Smoker  . Smokeless tobacco: Never Used  . Alcohol use No    Review of Systems Constitutional: No fever/chills Eyes: No visual changes. ENT: Positive for otalgia Cardiovascular: Denies chest pain. Respiratory: Denies shortness of breath. Gastrointestinal: No abdominal pain.  No nausea, no vomiting.  No diarrhea.  No constipation. Genitourinary: Negative for dysuria. Musculoskeletal: Negative for back pain. Skin: Negative for rash. Neurological: Positive for headache, negative for focal weakness or numbness.  10-point ROS otherwise negative.  ____________________________________________   PHYSICAL EXAM:  VITAL SIGNS: ED Triage Vitals  Enc Vitals Group     BP 08/01/16 1541 (!) 145/81     Pulse Rate 08/01/16 1541 74     Resp 08/01/16 1541 18     Temp 08/01/16 1541 98.1 F (36.7 C)     Temp Source 08/01/16 1541 Oral     SpO2 08/01/16 1541 94 %     Weight 08/01/16 1542 198 lb (89.8 kg)     Height 08/01/16 1542 5\' 8"  (1.727 m)     Head Circumference --      Peak Flow --      Pain Score 08/01/16 1542 10     Pain Loc --      Pain Edu? --      Excl. in [place]? --     Constitutional: Alert and oriented 4 appropriate cooperative speaks in full clear sentences Eyes: Conjunctivae are normal. PERRL. EOMI. Head: Atraumatic. Nose:  No congestion/rhinnorhea. Mouth/Throat: Right tympanic membrane erythematous and bulging left tympanic membrane normal no mastoid tenderness no meningismus Neck: No stridor.   Cardiovascular: Normal rate, regular rhythm.  Good peripheral circulation. Respiratory: Normal respiratory effort.  No retractions. Lungs CTAB. Gastrointestinal: Soft and nontender. No distention. No abdominal bruits. No CVA tenderness. Musculoskeletal: No lower extremity tenderness nor edema.  No joint effusions. Neurologic: Cranial nerves II through XII intact no pronator drift 5 out of 5 grips biceps triceps plantar flexion dorsiflexion and really steady gait 2+ DTRs no ankle  clonus sensation intact to light touch throughout pupils midrange and briskly bilaterally equal Skin:  Skin is warm, dry and intact. No rash noted. Psychiatric: Mood and affect are normal. Speech and behavior are normal.  ____________________________________________   LABS (all labs ordered are listed, but only abnormal results are displayed)  Labs Reviewed  CBC - Abnormal; Notable for the following:       Result Value   MCV 74.1 (*)    MCH 23.9 (*)    RDW 15.5 (*)    All other components within normal limits  COMPREHENSIVE METABOLIC PANEL - Abnormal; Notable for the following:    Glucose, Bld 125 (*)    Calcium 8.8 (*)    Total Bilirubin 0.2 (*)    All other components within normal limits  GLUCOSE, CAPILLARY - Abnormal; Notable for the following:    Glucose-Capillary 117 (*)    All other components within normal limits  PROTIME-INR  APTT  DIFFERENTIAL  TROPONIN I  CBG MONITORING, ED   ____________________________________________  EKG   ____________________________________________  RADIOLOGY  CT scan with no acute disease ____________________________________________   PROCEDURES  Procedure(s) performed: no  Procedures  Critical Care performed: no  ____________________________________________   INITIAL IMPRESSION / ASSESSMENT AND PLAN / ED COURSE  Pertinent labs & imaging results that were available during my care of the patient were reviewed by me and considered in my medical decision making (see chart for details).  On arrival the patient is very well-appearing and neurologically intact. Her symptoms are not consistent with stroke or TIA but are consistent with acute otitis media and she has an acutely bulging and erythematous right eardrum. I will treat her symptomatically and reevaluate.     ----------------------------------------- 7:19 PM on 08/01/2016 -----------------------------------------  After intra-auricular lidocaine the patient's pain  is resolved. I will discharge her home with a short course of amoxicillin. ____________________________________________   FINAL CLINICAL IMPRESSION(S) / ED DIAGNOSES  Final diagnoses:  Right acute serous otitis media, recurrence not specified      NEW MEDICATIONS STARTED DURING THIS VISIT:  Discharge Medication List as of 08/01/2016  7:18 PM    START taking these medications   Details  amoxicillin (AMOXIL) 500 MG tablet Take 2 tablets (1,000 mg total) by mouth 2 (two) times daily., Starting Sat 08/01/2016, Until Sat 08/08/2016, Print         Note:  This document was prepared using Dragon voice recognition software and may include unintentional dictation errors.     Darel Hong, MD 08/02/16 1447

## 2016-09-16 ENCOUNTER — Emergency Department
Admission: EM | Admit: 2016-09-16 | Discharge: 2016-09-16 | Disposition: A | Discharge status: Home / Self Care | Payer: Medicaid Other | Attending: Emergency Medicine | Admitting: Emergency Medicine

## 2016-09-16 DIAGNOSIS — M069 Rheumatoid arthritis, unspecified: Secondary | ICD-10-CM | POA: Diagnosis not present

## 2016-09-16 DIAGNOSIS — M25551 Pain in right hip: Secondary | ICD-10-CM | POA: Diagnosis present

## 2016-09-16 DIAGNOSIS — M7061 Trochanteric bursitis, right hip: Secondary | ICD-10-CM | POA: Diagnosis not present

## 2016-09-16 DIAGNOSIS — Y939 Activity, unspecified: Secondary | ICD-10-CM | POA: Insufficient documentation

## 2016-09-16 MED ORDER — PREDNISONE 10 MG PO TABS: 10.0000 mg | ORAL_TABLET | Freq: Every day | ORAL | 0 refills | Status: DC

## 2016-09-16 MED ORDER — LIDOCAINE 5 % EX PTCH: 1.0000 | MEDICATED_PATCH | Freq: Two times a day (BID) | CUTANEOUS | 0 refills | Status: DC

## 2016-09-16 MED ORDER — METHOCARBAMOL 500 MG PO TABS: 500.0000 mg | ORAL_TABLET | Freq: Four times a day (QID) | ORAL | 0 refills | Status: DC

## 2016-09-16 MED ORDER — METHYLPREDNISOLONE SODIUM SUCC 125 MG IJ SOLR
125.0000 mg | Freq: Once | INTRAMUSCULAR | Status: AC
  Administered 2016-09-16: 125 mg via INTRAMUSCULAR
  Filled 2016-09-16: qty 2

## 2016-09-16 MED ORDER — ORPHENADRINE CITRATE 30 MG/ML IJ SOLN
60.0000 mg | Freq: Once | INTRAMUSCULAR | Status: AC
  Administered 2016-09-16: 60 mg via INTRAMUSCULAR
  Filled 2016-09-16: qty 2

## 2016-09-16 NOTE — ED Notes (Signed)
Pt. Verbalizes understanding of d/c instructions, prescriptions, and follow-up. VS stable and pain stable per pt report.  Pt. In NAD at time of d/c and denies further concerns regarding this visit. Pt. Stable at the time of departure from the unit, departing unit by the safest and most appropriate manner per that pt condition and limitations. Pt advised to return to the ED at any time for emergent concerns, or for new/worsening symptoms.

## 2016-09-16 NOTE — ED Provider Notes (Signed)
[facility] Emergency Department Provider Note  ____________________________________________  Time seen: Approximately 9:39 PM  I have reviewed the triage vital signs and the nursing notes.   HISTORY  Chief Complaint Hip Pain and Back Pain    HPI Pam Snyder is a 51 y.o. female who presents emergency department complaining of back and right hip pain. Patient states that she has a history of rheumatoid arthritis. She states that typically with her knees give her issues. Patient reports that today she awoke with stiff back and pain in the right hip area. She reports that while it was much worse today she has had some intermittent pain to her hip region. Patient has a appointment with her PCP in 1 week was unable to control the pain with Tylenol at home. Patient reports that she is to be on methotrexate for her rheumatoid arthritis was taken off due to severe anemia and heart condition. Patient typically does not take NSAIDs for her complaints. No trauma to the region. No bowel or bladder symptoms, saddle anesthesia, paresthesias. No complaints at this time.   Past Medical History:  Diagnosis Date  . Anemia   . Arthritis   . Mitral valve prolapse   . Sickle cell trait ([place])   . Stroke [facility])     There are no active problems to display for this patient.   Past Surgical History:  Procedure Laterality Date  . CARDIAC CATHETERIZATION     x3 last in 2014  . fibroid cyst      Prior to Admission medications   Medication Sig Start Date End Date Taking? Authorizing Provider  lidocaine (LIDODERM) 5 % Place 1 patch onto the skin every 12 (twelve) hours. Remove & Discard patch within 12 hours or as directed by MD 09/16/16   Charline Bills Britney Captain, PA-C  methocarbamol (ROBAXIN) 500 MG tablet Take 1 tablet (500 mg total) by mouth 4 (four) times daily. 09/16/16   Charline Bills Arielle Eber, PA-C  ondansetron (ZOFRAN ODT) 4 MG disintegrating tablet Take 1 tablet (4 mg total)  by mouth every 8 (eight) hours as needed for nausea or vomiting. Patient not taking: Reported on 08/01/2016 06/27/16   Paulette Blanch, MD  oxyCODONE-acetaminophen (ROXICET) 5-325 MG tablet Take 1 tablet by mouth every 4 (four) hours as needed for severe pain. Patient not taking: Reported on 08/01/2016 06/27/16   Paulette Blanch, MD  predniSONE (DELTASONE) 10 MG tablet Take 1 tablet (10 mg total) by mouth daily. 09/16/16   Charline Bills Lallie Strahm, PA-C    Allergies Patient has no known allergies.  No family history on file.  Social History Social History  Substance Use Topics  . Smoking status: Never Smoker  . Smokeless tobacco: Never Used  . Alcohol use No     Review of Systems  Constitutional: No fever/chills Cardiovascular: no chest pain. Respiratory: no cough. No SOB. Gastrointestinal: No abdominal pain.  No nausea, no vomiting.  No diarrhea.  No constipation. Genitourinary: Negative for dysuria. No hematuria Musculoskeletal: Positive for lower back pain and right hip pain Skin: Negative for rash, abrasions, lacerations, ecchymosis. Neurological: Negative for headaches, focal weakness or numbness. 10-point ROS otherwise negative.  ____________________________________________   PHYSICAL EXAM:  VITAL SIGNS: ED Triage Vitals [09/16/16 2048]  Enc Vitals Group     BP      Pulse      Resp      Temp      Temp src      SpO2  Weight 198 lb (89.8 kg)     Height 5\' 8"  (1.727 m)     Head Circumference      Peak Flow      Pain Score      Pain Loc      Pain Edu?      Excl. in [place]?      Constitutional: Alert and oriented. Well appearing and in no acute distress. Eyes: Conjunctivae are normal. PERRL. EOMI. Head: Atraumatic. Neck: No stridor.    Cardiovascular: Normal rate, regular rhythm. Normal S1 and S2.  Good peripheral circulation. Respiratory: Normal respiratory effort without tachypnea or retractions. Lungs CTAB. Good air entry to the bases with no decreased or absent  breath sounds. Gastrointestinal: Bowel sounds 4 quadrants. Soft and nontender to palpation. No guarding or rigidity. No palpable masses. No distention. No CVA tenderness. Musculoskeletal: Full range of motion to all extremities. No gross deformities appreciated.No deformity to spine upon inspection. No tenderness midline. Patient is mildly tender to palpation SI joint. Patient is also tender to palpation over the greater trochanter of the hip. No operable abnormality to the hip. No visible deformity or edema to the hip. Dorsalis pedis pulse intact bilateral lower extremities. Sensation intact and equal bilateral lower extremities. Neurologic:  Normal speech and language. No gross focal neurologic deficits are appreciated.  Skin:  Skin is warm, dry and intact. No rash noted. Psychiatric: Mood and affect are normal. Speech and behavior are normal. Patient exhibits appropriate insight and judgement.   ____________________________________________   LABS (all labs ordered are listed, but only abnormal results are displayed)  Labs Reviewed - No data to display ____________________________________________  EKG   ____________________________________________  RADIOLOGY   No results found.  ____________________________________________    PROCEDURES  Procedure(s) performed:    Procedures    Medications  methylPREDNISolone sodium succinate (SOLU-MEDROL) 125 mg/2 mL injection 125 mg (not administered)  orphenadrine (NORFLEX) injection 60 mg (not administered)     ____________________________________________   INITIAL IMPRESSION / ASSESSMENT AND PLAN / ED COURSE  Pertinent labs & imaging results that were available during my care of the patient were reviewed by me and considered in my medical decision making (see chart for details).  Review of the [place] CSRS was performed in accordance of the [place] prior to dispensing any controlled drugs.     Patient's diagnosis is consistent  with Rheumatoid arthritis flare was trochanteric bursitis of the right hip. Patient will be treated with injectable steroids and muscle relaxer emergency department. Patient will be discharged home with prescriptions for prednisone taper with muscle relaxer and Lidoderm patch. Patient is to follow up with primary care as needed or otherwise directed. Patient is given ED precautions to return to the ED for any worsening or new symptoms.     ____________________________________________  FINAL CLINICAL IMPRESSION(S) / ED DIAGNOSES  Final diagnoses:  Rheumatoid arthritis flare (HCC)  Trochanteric bursitis of right hip      NEW MEDICATIONS STARTED DURING THIS VISIT:  New Prescriptions   LIDOCAINE (LIDODERM) 5 %    Place 1 patch onto the skin every 12 (twelve) hours. Remove & Discard patch within 12 hours or as directed by MD   METHOCARBAMOL (ROBAXIN) 500 MG TABLET    Take 1 tablet (500 mg total) by mouth 4 (four) times daily.   PREDNISONE (DELTASONE) 10 MG TABLET    Take 1 tablet (10 mg total) by mouth daily.        This chart was dictated using voice recognition  software/Dragon. Despite best efforts to proofread, errors can occur which can change the meaning. Any change was purely unintentional.    Darletta Moll, PA-C 09/16/16 2149    Rudene Re, MD 09/21/16 1740

## 2016-09-16 NOTE — ED Triage Notes (Signed)
Pt with very slow, shuffle gait to triage, refused wheelchair due to worsening pain when sitting. Pt reports she has rheumatoid arthritis, woke this AM with worsening stiffness and pain in the right hip and lower back area. Pt has appointment with PCP nect week but here to ED for unrelieved pain control at home. Pt takes tylenol for pain, last taken at 1900.

## 2016-09-16 NOTE — ED Notes (Signed)
See triage note. Pt states cannot bear pain until next month appointment at PCP for cortisone injection and imaging. Pt states OTC tylenol w/o relief.

## 2016-10-26 ENCOUNTER — Emergency Department: Payer: Medicaid Other

## 2016-10-26 DIAGNOSIS — D259 Leiomyoma of uterus, unspecified: Secondary | ICD-10-CM | POA: Diagnosis not present

## 2016-10-26 DIAGNOSIS — R102 Pelvic and perineal pain: Secondary | ICD-10-CM | POA: Diagnosis present

## 2016-10-26 LAB — CBC WITH DIFFERENTIAL/PLATELET
BASOS ABS: 0 10*3/uL (ref 0–0.1)
Basophils Relative: 0 %
EOS ABS: 0.1 10*3/uL (ref 0–0.7)
Eosinophils Relative: 2 %
HCT: 34.8 % — ABNORMAL LOW (ref 35.0–47.0)
HEMOGLOBIN: 11.4 g/dL — AB (ref 12.0–16.0)
LYMPHS ABS: 1.6 10*3/uL (ref 1.0–3.6)
LYMPHS PCT: 32 %
MCH: 23.8 pg — ABNORMAL LOW (ref 26.0–34.0)
MCHC: 32.9 g/dL (ref 32.0–36.0)
MCV: 72.3 fL — ABNORMAL LOW (ref 80.0–100.0)
Monocytes Absolute: 0.5 10*3/uL (ref 0.2–0.9)
Monocytes Relative: 10 %
NEUTROS PCT: 56 %
Neutro Abs: 2.8 10*3/uL (ref 1.4–6.5)
Platelets: 227 10*3/uL (ref 150–440)
RBC: 4.8 MIL/uL (ref 3.80–5.20)
RDW: 15.1 % — ABNORMAL HIGH (ref 11.5–14.5)
WBC: 4.9 10*3/uL (ref 3.6–11.0)

## 2016-10-26 LAB — URINALYSIS, ROUTINE W REFLEX MICROSCOPIC
BACTERIA UA: NONE SEEN
BILIRUBIN URINE: NEGATIVE
Glucose, UA: NEGATIVE mg/dL
Ketones, ur: NEGATIVE mg/dL
Leukocytes, UA: NEGATIVE
NITRITE: NEGATIVE
PH: 5 (ref 5.0–8.0)
Protein, ur: NEGATIVE mg/dL
SPECIFIC GRAVITY, URINE: 1.025 (ref 1.005–1.030)
SQUAMOUS EPITHELIAL / LPF: NONE SEEN
WBC UA: NONE SEEN WBC/hpf (ref 0–5)

## 2016-10-26 LAB — COMPREHENSIVE METABOLIC PANEL
ALK PHOS: 82 U/L (ref 38–126)
ALT: 14 U/L (ref 14–54)
AST: 18 U/L (ref 15–41)
Albumin: 3.9 g/dL (ref 3.5–5.0)
Anion gap: 7 (ref 5–15)
BUN: 10 mg/dL (ref 6–20)
CALCIUM: 9 mg/dL (ref 8.9–10.3)
CO2: 27 mmol/L (ref 22–32)
CREATININE: 0.76 mg/dL (ref 0.44–1.00)
Chloride: 106 mmol/L (ref 101–111)
GFR calc non Af Amer: >60 mL/min (ref >60)
GLUCOSE: 97 mg/dL (ref 65–99)
Potassium: 3.6 mmol/L (ref 3.5–5.1)
SODIUM: 140 mmol/L (ref 135–145)
Total Bilirubin: 0.2 mg/dL — ABNORMAL LOW (ref 0.3–1.2)
Total Protein: 7.2 g/dL (ref 6.5–8.1)

## 2016-10-26 NOTE — ED Triage Notes (Signed)
Pt presents to ED c/o pelvic pain r/t fibroid cysts. Pain radiates to lower abdomen and back.

## 2016-10-27 ENCOUNTER — Emergency Department
Admission: EM | Admit: 2016-10-27 | Discharge: 2016-10-27 | Disposition: A | Discharge status: Home / Self Care | Payer: Medicaid Other | Attending: Emergency Medicine | Admitting: Emergency Medicine

## 2016-10-27 DIAGNOSIS — R102 Pelvic and perineal pain: Secondary | ICD-10-CM

## 2016-10-27 DIAGNOSIS — D259 Leiomyoma of uterus, unspecified: Secondary | ICD-10-CM

## 2016-10-27 LAB — PREGNANCY, URINE: Preg Test, Ur: NEGATIVE

## 2016-10-27 MED ORDER — IBUPROFEN 800 MG PO TABS
800.0000 mg | ORAL_TABLET | Freq: Once | ORAL | Status: AC
  Administered 2016-10-27: 800 mg via ORAL
  Filled 2016-10-27: qty 1

## 2016-10-27 MED ORDER — IBUPROFEN 800 MG PO TABS: 800.0000 mg | ORAL_TABLET | Freq: Three times a day (TID) | ORAL | 0 refills | Status: DC | PRN

## 2016-10-27 MED ORDER — HYDROCODONE-ACETAMINOPHEN 5-325 MG PO TABS
1.0000 | ORAL_TABLET | Freq: Once | ORAL | Status: AC
  Administered 2016-10-27: 1 via ORAL
  Filled 2016-10-27: qty 1

## 2016-10-27 MED ORDER — HYDROCODONE-ACETAMINOPHEN 5-325 MG PO TABS: 1.0000 | ORAL_TABLET | Freq: Four times a day (QID) | ORAL | 0 refills | Status: DC | PRN

## 2016-10-27 NOTE — ED Provider Notes (Signed)
[facility] Emergency Department Provider Note   ____________________________________________   First MD Initiated Contact with Patient 10/27/16 0103     (approximate)  I have reviewed the triage vital signs and the nursing notes.   HISTORY  Chief Complaint Pelvic Pain (radiates to lower abdomen and back) and Fibroids    HPI Pam Snyder is a 51 y.o. female who presents to the ED from home with a chief complaint of pelvic pain. Patient has a history of uterine fibroids, told to have a hysterectomy years ago, scheduled surgery twice but did not show up secondary to fear. Last scheduled surgery was 2 years ago. Started her period 2 days ago, heavy, and notes pelvic pain x 1 day. Denies associated fever, chills, chest pain, shortness of breath, abdominal pain, nausea, vomiting, dysuria, or diarrhea. Denies recent travel or trauma. Nothing makes her symptoms better or worse.  Past Medical History:  Diagnosis Date  . Anemia   . Arthritis   . Mitral valve prolapse   . Sickle cell trait ([place])   . Stroke [facility])     There are no active problems to display for this patient.   Past Surgical History:  Procedure Laterality Date  . CARDIAC CATHETERIZATION     x3 last in 2014  . fibroid cyst      Prior to Admission medications   Medication Sig Start Date End Date Taking? Authorizing Provider  HYDROcodone-acetaminophen (NORCO) 5-325 MG tablet Take 1 tablet by mouth every 6 (six) hours as needed for moderate pain. 10/27/16   Paulette Blanch, MD  ibuprofen (ADVIL,MOTRIN) 800 MG tablet Take 1 tablet (800 mg total) by mouth every 8 (eight) hours as needed for moderate pain. 10/27/16   Paulette Blanch, MD  lidocaine (LIDODERM) 5 % Place 1 patch onto the skin every 12 (twelve) hours. Remove & Discard patch within 12 hours or as directed by MD 09/16/16   Cuthriell, Charline Bills, PA-C  methocarbamol (ROBAXIN) 500 MG tablet Take 1 tablet (500 mg total) by mouth 4 (four) times  daily. 09/16/16   Cuthriell, Charline Bills, PA-C  ondansetron (ZOFRAN ODT) 4 MG disintegrating tablet Take 1 tablet (4 mg total) by mouth every 8 (eight) hours as needed for nausea or vomiting. Patient not taking: Reported on 08/01/2016 06/27/16   Paulette Blanch, MD  oxyCODONE-acetaminophen (ROXICET) 5-325 MG tablet Take 1 tablet by mouth every 4 (four) hours as needed for severe pain. Patient not taking: Reported on 08/01/2016 06/27/16   Paulette Blanch, MD  predniSONE (DELTASONE) 10 MG tablet Take 1 tablet (10 mg total) by mouth daily. 09/16/16   Cuthriell, Charline Bills, PA-C    Allergies Patient has no known allergies.  No family history on file.  Social History Social History  Substance Use Topics  . Smoking status: Never Smoker  . Smokeless tobacco: Never Used  . Alcohol use No    Review of Systems  Constitutional: No fever/chills. Eyes: No visual changes. ENT: No sore throat. Cardiovascular: Denies chest pain. Respiratory: Denies shortness of breath. Gastrointestinal: Positive for pelvic pain. No abdominal pain.  No nausea, no vomiting.  No diarrhea.  No constipation. Genitourinary: Negative for dysuria. Musculoskeletal: Negative for back pain. Skin: Negative for rash. Neurological: Negative for headaches, focal weakness or numbness.   ____________________________________________   PHYSICAL EXAM:  VITAL SIGNS: ED Triage Vitals [10/26/16 2124]  Enc Vitals Group     BP (!) 154/78     Pulse Rate 64  Resp 20     Temp 98.2 F (36.8 C)     Temp Source Oral     SpO2 100 %     Weight 210 lb (95.3 kg)     Height 5' 8.5" (1.74 m)     Head Circumference      Peak Flow      Pain Score 10     Pain Loc      Pain Edu?      Excl. in [place]?     Constitutional: Alert and oriented. Well appearing and in no acute distress. Eyes: Conjunctivae are normal. PERRL. EOMI. Head: Atraumatic. Nose: No congestion/rhinnorhea. Mouth/Throat: Mucous membranes are moist.  Oropharynx  non-erythematous. Neck: No stridor. Cardiovascular: Normal rate, regular rhythm. Grossly normal heart sounds.  Good peripheral circulation. Respiratory: Normal respiratory effort.  No retractions. Lungs CTAB. Gastrointestinal: Soft and mildly tender to palpation low midline pelvis without rebound or guarding. No distention. No abdominal bruits. No CVA tenderness. Musculoskeletal: No lower extremity tenderness nor edema.  No joint effusions. Neurologic:  Normal speech and language. No gross focal neurologic deficits are appreciated. No gait instability. Skin:  Skin is warm, dry and intact. No rash noted. Psychiatric: Mood and affect are normal. Speech and behavior are normal.  ____________________________________________   LABS (all labs ordered are listed, but only abnormal results are displayed)  Labs Reviewed  CBC WITH DIFFERENTIAL/PLATELET - Abnormal; Notable for the following:       Result Value   Hemoglobin 11.4 (*)    HCT 34.8 (*)    MCV 72.3 (*)    MCH 23.8 (*)    RDW 15.1 (*)    All other components within normal limits  COMPREHENSIVE METABOLIC PANEL - Abnormal; Notable for the following:    Total Bilirubin 0.2 (*)    All other components within normal limits  URINALYSIS, ROUTINE W REFLEX MICROSCOPIC - Abnormal; Notable for the following:    Color, Urine YELLOW (*)    APPearance CLEAR (*)    Hgb urine dipstick MODERATE (*)    All other components within normal limits  PREGNANCY, URINE   ____________________________________________  EKG  None ____________________________________________  RADIOLOGY  Pelvic US interpreted per Dr. Jeannine Boga: 1. Enlarged fibroid uterus as above.  2. No other acute abnormality within the pelvis.  3. Nonvisualization of the ovaries. No adnexal mass   ____________________________________________   PROCEDURES  Procedure(s) performed:   Pelvic exam: External exam WNL without rashes, lesions or vesicles. Speculum exam vaginal  bleeding. Cervical os closed. Bimanual exam WNL.  Procedures  Critical Care performed: No  ____________________________________________   INITIAL IMPRESSION / ASSESSMENT AND PLAN / ED COURSE  Pertinent labs & imaging results that were available during my care of the patient were reviewed by me and considered in my medical decision making (see chart for details).  51 year old female who present with pelvic discomfort secondary to uterine fibroids. Laboratory and urinalysis remarkable for mild anemia. Will discharge home with prescriptions for Motrin and Norco; referred to GYN for follow-up. Strict return precautions given. Patient verbalizes understanding and agrees with plan of care.      ____________________________________________   FINAL CLINICAL IMPRESSION(S) / ED DIAGNOSES  Final diagnoses:  Pelvic pain in female  Uterine leiomyoma, unspecified location      NEW MEDICATIONS STARTED DURING THIS VISIT:  New Prescriptions   HYDROCODONE-ACETAMINOPHEN (NORCO) 5-325 MG TABLET    Take 1 tablet by mouth every 6 (six) hours as needed for moderate pain.  IBUPROFEN (ADVIL,MOTRIN) 800 MG TABLET    Take 1 tablet (800 mg total) by mouth every 8 (eight) hours as needed for moderate pain.     Note:  This document was prepared using Dragon voice recognition software and may include unintentional dictation errors.    Paulette Blanch, MD 10/27/16 [phone]

## 2016-10-27 NOTE — Discharge Instructions (Signed)
1.Take pain medicines as needed (Motrin/Norco #15). 2. Return to the ER for worsening symptoms, heavy bleeding, persistent vomiting, difficult breathing or other concerns.

## 2016-11-16 ENCOUNTER — Encounter: Payer: Self-pay | Admitting: Obstetrics and Gynecology

## 2016-11-17 ENCOUNTER — Ambulatory Visit: Payer: Self-pay | Admitting: Obstetrics and Gynecology

## 2017-01-03 ENCOUNTER — Encounter: Payer: Self-pay | Admitting: Emergency Medicine

## 2017-01-03 ENCOUNTER — Emergency Department: Payer: Medicaid Other

## 2017-01-03 ENCOUNTER — Inpatient Hospital Stay
Admission: EM | Admit: 2017-01-03 | Discharge: 2017-01-06 | DRG: 337 | Disposition: A | Discharge status: Home / Self Care | Payer: Medicaid Other | Attending: Surgery | Admitting: Surgery

## 2017-01-03 DIAGNOSIS — Z832 Family history of diseases of the blood and blood-forming organs and certain disorders involving the immune mechanism: Secondary | ICD-10-CM

## 2017-01-03 DIAGNOSIS — Z79899 Other long term (current) drug therapy: Secondary | ICD-10-CM

## 2017-01-03 DIAGNOSIS — K429 Umbilical hernia without obstruction or gangrene: Secondary | ICD-10-CM | POA: Diagnosis present

## 2017-01-03 DIAGNOSIS — D571 Sickle-cell disease without crisis: Secondary | ICD-10-CM | POA: Diagnosis present

## 2017-01-03 DIAGNOSIS — R109 Unspecified abdominal pain: Secondary | ICD-10-CM

## 2017-01-03 DIAGNOSIS — K562 Volvulus: Principal | ICD-10-CM | POA: Diagnosis present

## 2017-01-03 DIAGNOSIS — Z8673 Personal history of transient ischemic attack (TIA), and cerebral infarction without residual deficits: Secondary | ICD-10-CM

## 2017-01-03 DIAGNOSIS — Z7952 Long term (current) use of systemic steroids: Secondary | ICD-10-CM

## 2017-01-03 LAB — URINALYSIS, COMPLETE (UACMP) WITH MICROSCOPIC
BACTERIA UA: NONE SEEN
BILIRUBIN URINE: NEGATIVE
Glucose, UA: NEGATIVE mg/dL
Hgb urine dipstick: NEGATIVE
Ketones, ur: NEGATIVE mg/dL
LEUKOCYTES UA: NEGATIVE
Nitrite: NEGATIVE
PH: 5 (ref 5.0–8.0)
PROTEIN: NEGATIVE mg/dL
RBC / HPF: NONE SEEN RBC/hpf (ref 0–5)
Specific Gravity, Urine: 1.02 (ref 1.005–1.030)

## 2017-01-03 LAB — COMPREHENSIVE METABOLIC PANEL
ALBUMIN: 4 g/dL (ref 3.5–5.0)
ALK PHOS: 79 U/L (ref 38–126)
ALT: 16 U/L (ref 14–54)
ANION GAP: 4 — AB (ref 5–15)
AST: 20 U/L (ref 15–41)
BILIRUBIN TOTAL: 0.5 mg/dL (ref 0.3–1.2)
BUN: 10 mg/dL (ref 6–20)
CO2: 29 mmol/L (ref 22–32)
Calcium: 9.2 mg/dL (ref 8.9–10.3)
Chloride: 106 mmol/L (ref 101–111)
Creatinine, Ser: 0.87 mg/dL (ref 0.44–1.00)
GFR calc Af Amer: >60 mL/min (ref >60)
GFR calc non Af Amer: >60 mL/min (ref >60)
GLUCOSE: 117 mg/dL — AB (ref 65–99)
Potassium: 3.7 mmol/L (ref 3.5–5.1)
SODIUM: 139 mmol/L (ref 135–145)
Total Protein: 7.4 g/dL (ref 6.5–8.1)

## 2017-01-03 LAB — CBC
HEMATOCRIT: 37.9 % (ref 35.0–47.0)
HEMOGLOBIN: 12.4 g/dL (ref 12.0–16.0)
MCH: 23.8 pg — AB (ref 26.0–34.0)
MCHC: 32.8 g/dL (ref 32.0–36.0)
MCV: 72.7 fL — ABNORMAL LOW (ref 80.0–100.0)
Platelets: 225 10*3/uL (ref 150–440)
RBC: 5.21 MIL/uL — AB (ref 3.80–5.20)
RDW: 15 % — ABNORMAL HIGH (ref 11.5–14.5)
WBC: 4.8 10*3/uL (ref 3.6–11.0)

## 2017-01-03 LAB — PREGNANCY, URINE: PREG TEST UR: NEGATIVE

## 2017-01-03 LAB — LIPASE, BLOOD: Lipase: 43 U/L (ref 11–51)

## 2017-01-03 MED ORDER — IOPAMIDOL (ISOVUE-300) INJECTION 61%
30.0000 mL | Freq: Once | INTRAVENOUS | Status: AC
  Administered 2017-01-03: 30 mL via ORAL

## 2017-01-03 MED ORDER — MORPHINE SULFATE (PF) 4 MG/ML IV SOLN
4.0000 mg | Freq: Once | INTRAVENOUS | Status: AC
  Administered 2017-01-04: 4 mg via INTRAVENOUS
  Filled 2017-01-03: qty 1

## 2017-01-03 MED ORDER — IOPAMIDOL (ISOVUE-300) INJECTION 61%
100.0000 mL | Freq: Once | INTRAVENOUS | Status: AC | PRN
  Administered 2017-01-03: 100 mL via INTRAVENOUS

## 2017-01-03 NOTE — ED Triage Notes (Signed)
Pt to ED with c/o of epigastric pain that started this morning. Pt denies N/V/D.

## 2017-01-03 NOTE — ED Notes (Addendum)
Patient c/o intermittent abdominal pain and nausea for several months, however reports pain was initially lower left abdomen.   Patient currently c/o left upper abdominal pain beginning last night, starting below the left breast radiating down to pelvis. Patient rates pain as 10 out of 10 and describes pain as pounding.   Patient c/o abdominal distension/pressure/bloat.  Pt does believe she could be pregnant due to fibroids and age, however has not had a period in 2 months.    Patient denies diarrhea and emesis.

## 2017-01-03 NOTE — ED Notes (Signed)
Patient returned from CT

## 2017-01-03 NOTE — ED Provider Notes (Signed)
[facility] Emergency Department Provider Note   ____________________________________________   I have reviewed the triage vital signs and the nursing notes.   HISTORY  Chief Complaint Abdominal Pain   History limited by: Not Limited   HPI Pam Snyder is a 51 y.o. female who presents to the emergency department today because of concerns for abdominal pain. The patient states it is located primarily in the left upper quadrant. It is associated with distention. It started last night. It woke the patient from sleep. It has been constant. Patient has had a small amount of nausea without any vomiting. She had a normal bowel movement earlier today. She did not notice any blood in it. No fevers. She denies similar pain in the past.   Past Medical History:  Diagnosis Date  . Anemia   . Arthritis   . Mitral valve prolapse   . Ovarian cyst   . Sickle cell trait ([place])   . Stroke [facility])     There are no active problems to display for this patient.   Past Surgical History:  Procedure Laterality Date  . CARDIAC CATHETERIZATION     x3 last in 2014  . ENDOMETRIAL ABLATION  2014   [facility]  . fibroid cyst      Prior to Admission medications   Medication Sig Start Date End Date Taking? Authorizing Provider  HYDROcodone-acetaminophen (NORCO) 5-325 MG tablet Take 1 tablet by mouth every 6 (six) hours as needed for moderate pain. 10/27/16   Paulette Blanch, MD  ibuprofen (ADVIL,MOTRIN) 800 MG tablet Take 1 tablet (800 mg total) by mouth every 8 (eight) hours as needed for moderate pain. 10/27/16   Paulette Blanch, MD  lidocaine (LIDODERM) 5 % Place 1 patch onto the skin every 12 (twelve) hours. Remove & Discard patch within 12 hours or as directed by MD 09/16/16   Cuthriell, Charline Bills, PA-C  methocarbamol (ROBAXIN) 500 MG tablet Take 1 tablet (500 mg total) by mouth 4 (four) times daily. 09/16/16   Cuthriell, Charline Bills, PA-C  ondansetron (ZOFRAN ODT) 4 MG disintegrating  tablet Take 1 tablet (4 mg total) by mouth every 8 (eight) hours as needed for nausea or vomiting. Patient not taking: Reported on 08/01/2016 06/27/16   Paulette Blanch, MD  oxyCODONE-acetaminophen (ROXICET) 5-325 MG tablet Take 1 tablet by mouth every 4 (four) hours as needed for severe pain. Patient not taking: Reported on 08/01/2016 06/27/16   Paulette Blanch, MD  predniSONE (DELTASONE) 10 MG tablet Take 1 tablet (10 mg total) by mouth daily. 09/16/16   Cuthriell, Charline Bills, PA-C    Allergies Patient has no known allergies.  Family History  Problem Relation Age of Onset  . Lung cancer Mother   . Leukemia Father   . Sickle cell anemia Brother   . Colon cancer Maternal Grandmother   . Breast cancer Maternal Aunt   . Sickle cell anemia Daughter     Social History Social History  Substance Use Topics  . Smoking status: Never Smoker  . Smokeless tobacco: Never Used  . Alcohol use No    Review of Systems Constitutional: No fever/chills Eyes: No visual changes. ENT: No sore throat. Cardiovascular: Denies chest pain. Respiratory: Denies shortness of breath. Gastrointestinal: Positive for abdominal pain and nausea.  Genitourinary: Negative for dysuria. Musculoskeletal: Negative for back pain. Skin: Negative for rash. Neurological: Negative for headaches, focal weakness or numbness.  ____________________________________________   PHYSICAL EXAM:  VITAL SIGNS: ED Triage Vitals  Enc  Vitals Group     BP 01/03/17 1806 134/81     Pulse Rate 01/03/17 1806 80     Resp 01/03/17 1806 16     Temp 01/03/17 1806 99 F (37.2 C)     Temp Source 01/03/17 1806 Oral     SpO2 01/03/17 1806 100 %     Weight 01/03/17 1807 215 lb (97.5 kg)     Height 01/03/17 1807 5\' 9"  (1.753 m)     Head Circumference --      Peak Flow --      Pain Score 01/03/17 1806 10   Constitutional: Alert and oriented. Well appearing and in no distress. Eyes: Conjunctivae are normal.  ENT   Head: Normocephalic and  atraumatic.   Nose: No congestion/rhinnorhea.   Mouth/Throat: Mucous membranes are moist.   Neck: No stridor. Hematological/Lymphatic/Immunilogical: No cervical lymphadenopathy. Cardiovascular: Normal rate, regular rhythm.  No murmurs, rubs, or gallops.  Respiratory: Normal respiratory effort without tachypnea nor retractions. Breath sounds are clear and equal bilaterally. No wheezes/rales/rhonchi. Gastrointestinal: Soft. Somewhat distended in the upper abdomen. Tympanitic. Tender to palpation.  Genitourinary: Deferred Musculoskeletal: Normal range of motion in all extremities. No lower extremity edema. Neurologic:  Normal speech and language. No gross focal neurologic deficits are appreciated.  Skin:  Skin is warm, dry and intact. No rash noted. Psychiatric: Mood and affect are normal. Speech and behavior are normal. Patient exhibits appropriate insight and judgment.  ____________________________________________    LABS (pertinent positives/negatives)  Labs Reviewed  COMPREHENSIVE METABOLIC PANEL - Abnormal; Notable for the following:       Result Value   Glucose, Bld 117 (*)    Anion gap 4 (*)    All other components within normal limits  CBC - Abnormal; Notable for the following:    RBC 5.21 (*)    MCV 72.7 (*)    MCH 23.8 (*)    RDW 15.0 (*)    All other components within normal limits  URINALYSIS, COMPLETE (UACMP) WITH MICROSCOPIC - Abnormal; Notable for the following:    Color, Urine YELLOW (*)    APPearance CLEAR (*)    Squamous Epithelial / LPF 0-5 (*)    All other components within normal limits  LIPASE, BLOOD  PREGNANCY, URINE     ____________________________________________   EKG  None  ____________________________________________    RADIOLOGY  CT abd/pel IMPRESSION:  1. Redundant sigmoid colon with extension of a segment of sigmoid  through an apparent mesentery defect. No evidence of twisting or  obstruction. Moderate colonic stool  burden. Normal appendix.  2. Mildly enlarged and myomatous uterus.   I, Nance Pear, personally discussed these images and results by phone with the on-call radiologist and used this discussion as part of my medical decision making.    ___________________________________________   PROCEDURES  Procedures  ____________________________________________   INITIAL IMPRESSION / ASSESSMENT AND PLAN / ED COURSE  Pertinent labs & imaging results that were available during my care of the patient were reviewed by me and considered in my medical decision making (see chart for details).  Patient presented to the emergency department today because of concerns for abdominal pain. On exam her abdomen was distended and somewhat tympanitic. Because of this a CT scan was ordered. This did show a slightly distended part of the sigmoid colon that was apparently coming to her mesenteric defect. No signs of obstruction. Patient has been passing gas. Given this finding surgery was called and will evaluate the patient.  ____________________________________________  FINAL CLINICAL IMPRESSION(S) / ED DIAGNOSES  Final diagnoses:  Abdominal pain, unspecified abdominal location     Note: This dictation was prepared with Dragon dictation. Any transcriptional errors that result from this process are unintentional     Nance Pear, MD 01/04/17 0005

## 2017-01-03 NOTE — ED Notes (Signed)
ED Provider at bedside. 

## 2017-01-03 NOTE — ED Notes (Signed)
Patient transported to CT 

## 2017-01-04 ENCOUNTER — Encounter: Payer: Self-pay | Admitting: Anesthesiology

## 2017-01-04 ENCOUNTER — Emergency Department: Payer: Medicaid Other | Admitting: Anesthesiology

## 2017-01-04 ENCOUNTER — Encounter
Admission: EM | Disposition: A | Discharge status: Home / Self Care | Payer: Self-pay | Source: Home / Self Care | Attending: Surgery

## 2017-01-04 DIAGNOSIS — K562 Volvulus: Secondary | ICD-10-CM

## 2017-01-04 DIAGNOSIS — Z8673 Personal history of transient ischemic attack (TIA), and cerebral infarction without residual deficits: Secondary | ICD-10-CM | POA: Diagnosis not present

## 2017-01-04 DIAGNOSIS — Z832 Family history of diseases of the blood and blood-forming organs and certain disorders involving the immune mechanism: Secondary | ICD-10-CM | POA: Diagnosis not present

## 2017-01-04 DIAGNOSIS — R109 Unspecified abdominal pain: Secondary | ICD-10-CM

## 2017-01-04 DIAGNOSIS — K458 Other specified abdominal hernia without obstruction or gangrene: Secondary | ICD-10-CM | POA: Insufficient documentation

## 2017-01-04 DIAGNOSIS — D571 Sickle-cell disease without crisis: Secondary | ICD-10-CM | POA: Diagnosis present

## 2017-01-04 DIAGNOSIS — Z7952 Long term (current) use of systemic steroids: Secondary | ICD-10-CM | POA: Diagnosis not present

## 2017-01-04 DIAGNOSIS — K429 Umbilical hernia without obstruction or gangrene: Secondary | ICD-10-CM | POA: Diagnosis present

## 2017-01-04 DIAGNOSIS — Z79899 Other long term (current) drug therapy: Secondary | ICD-10-CM | POA: Diagnosis not present

## 2017-01-04 HISTORY — PX: LYSIS OF ADHESION: SHX5961

## 2017-01-04 HISTORY — DX: Volvulus: K56.2

## 2017-01-04 HISTORY — PX: LAPAROTOMY: SHX154

## 2017-01-04 HISTORY — PX: EPIGASTRIC HERNIA REPAIR: SHX404

## 2017-01-04 LAB — CBC
HEMATOCRIT: 38.4 % (ref 35.0–47.0)
Hemoglobin: 12.5 g/dL (ref 12.0–16.0)
MCH: 23.6 pg — ABNORMAL LOW (ref 26.0–34.0)
MCHC: 32.4 g/dL (ref 32.0–36.0)
MCV: 72.8 fL — AB (ref 80.0–100.0)
Platelets: 200 10*3/uL (ref 150–440)
RBC: 5.28 MIL/uL — AB (ref 3.80–5.20)
RDW: 15.3 % — ABNORMAL HIGH (ref 11.5–14.5)
WBC: 8.2 10*3/uL (ref 3.6–11.0)

## 2017-01-04 LAB — BASIC METABOLIC PANEL
ANION GAP: 6 (ref 5–15)
BUN: 9 mg/dL (ref 6–20)
CO2: 29 mmol/L (ref 22–32)
Calcium: 8.4 mg/dL — ABNORMAL LOW (ref 8.9–10.3)
Chloride: 105 mmol/L (ref 101–111)
Creatinine, Ser: 0.79 mg/dL (ref 0.44–1.00)
GFR calc Af Amer: >60 mL/min (ref >60)
GFR calc non Af Amer: >60 mL/min (ref >60)
GLUCOSE: 168 mg/dL — AB (ref 65–99)
POTASSIUM: 3.3 mmol/L — AB (ref 3.5–5.1)
Sodium: 140 mmol/L (ref 135–145)

## 2017-01-04 LAB — PHOSPHORUS: Phosphorus: 4.1 mg/dL (ref 2.5–4.6)

## 2017-01-04 LAB — MAGNESIUM: Magnesium: 1.7 mg/dL (ref 1.7–2.4)

## 2017-01-04 SURGERY — LAPAROTOMY, EXPLORATORY
Anesthesia: General | Site: Abdomen | Wound class: Clean Contaminated

## 2017-01-04 MED ORDER — MAGNESIUM SULFATE 2 GM/50ML IV SOLN
2.0000 g | Freq: Once | INTRAVENOUS | Status: AC
  Administered 2017-01-04: 2 g via INTRAVENOUS
  Filled 2017-01-04: qty 50

## 2017-01-04 MED ORDER — HYDROMORPHONE HCL 1 MG/ML IJ SOLN
INTRAMUSCULAR | Status: AC
  Administered 2017-01-04: 0.5 mg via INTRAVENOUS
  Filled 2017-01-04: qty 1

## 2017-01-04 MED ORDER — FENTANYL CITRATE (PF) 100 MCG/2ML IJ SOLN
INTRAMUSCULAR | Status: AC
  Administered 2017-01-04: 50 ug via INTRAVENOUS
  Filled 2017-01-04: qty 2

## 2017-01-04 MED ORDER — FENTANYL CITRATE (PF) 100 MCG/2ML IJ SOLN
INTRAMUSCULAR | Status: DC | PRN
  Administered 2017-01-04: 100 ug via INTRAVENOUS

## 2017-01-04 MED ORDER — SODIUM CHLORIDE 0.9 % IV SOLN
INTRAVENOUS | Status: DC | PRN
  Administered 2017-01-04: 60 mL

## 2017-01-04 MED ORDER — ACETAMINOPHEN 10 MG/ML IV SOLN
INTRAVENOUS | Status: AC
  Filled 2017-01-04: qty 100

## 2017-01-04 MED ORDER — MORPHINE SULFATE (PF) 4 MG/ML IV SOLN
2.0000 mg | INTRAVENOUS | Status: DC | PRN
  Administered 2017-01-04: 2 mg via INTRAVENOUS
  Filled 2017-01-04: qty 1

## 2017-01-04 MED ORDER — BUPIVACAINE HCL (PF) 0.5 % IJ SOLN
INTRAMUSCULAR | Status: AC
  Filled 2017-01-04: qty 30

## 2017-01-04 MED ORDER — MIDAZOLAM HCL 2 MG/2ML IJ SOLN
INTRAMUSCULAR | Status: AC
  Filled 2017-01-04: qty 2

## 2017-01-04 MED ORDER — KETOROLAC TROMETHAMINE 30 MG/ML IJ SOLN
30.0000 mg | Freq: Four times a day (QID) | INTRAMUSCULAR | Status: DC
  Administered 2017-01-04 – 2017-01-06 (×8): 30 mg via INTRAVENOUS
  Filled 2017-01-04 (×9): qty 1

## 2017-01-04 MED ORDER — PROCHLORPERAZINE MALEATE 10 MG PO TABS
10.0000 mg | ORAL_TABLET | Freq: Four times a day (QID) | ORAL | Status: DC | PRN
  Filled 2017-01-04 (×2): qty 1

## 2017-01-04 MED ORDER — SODIUM CHLORIDE 0.9 % IJ SOLN
INTRAMUSCULAR | Status: AC
  Filled 2017-01-04: qty 50

## 2017-01-04 MED ORDER — SODIUM CHLORIDE 0.9 % IJ SOLN: INTRAMUSCULAR | Status: DC | PRN

## 2017-01-04 MED ORDER — OXYCODONE HCL 5 MG PO TABS: 5.0000 mg | ORAL_TABLET | Freq: Once | ORAL | Status: DC | PRN

## 2017-01-04 MED ORDER — FENTANYL CITRATE (PF) 100 MCG/2ML IJ SOLN
25.0000 ug | INTRAMUSCULAR | Status: DC | PRN
  Administered 2017-01-04 (×2): 50 ug via INTRAVENOUS

## 2017-01-04 MED ORDER — ONDANSETRON HCL 4 MG/2ML IJ SOLN
INTRAMUSCULAR | Status: DC | PRN
  Administered 2017-01-04: 4 mg via INTRAVENOUS

## 2017-01-04 MED ORDER — SUCCINYLCHOLINE CHLORIDE 20 MG/ML IJ SOLN
INTRAMUSCULAR | Status: DC | PRN
  Administered 2017-01-04: 100 mg via INTRAVENOUS

## 2017-01-04 MED ORDER — SUGAMMADEX SODIUM 200 MG/2ML IV SOLN
INTRAVENOUS | Status: DC | PRN
  Administered 2017-01-04: 200 mg via INTRAVENOUS

## 2017-01-04 MED ORDER — ROCURONIUM BROMIDE 100 MG/10ML IV SOLN
INTRAVENOUS | Status: DC | PRN
  Administered 2017-01-04: 40 mg via INTRAVENOUS

## 2017-01-04 MED ORDER — DEXMEDETOMIDINE HCL 200 MCG/2ML IV SOLN
INTRAVENOUS | Status: DC | PRN
  Administered 2017-01-04 (×2): 8 ug via INTRAVENOUS

## 2017-01-04 MED ORDER — PROCHLORPERAZINE EDISYLATE 5 MG/ML IJ SOLN
5.0000 mg | Freq: Four times a day (QID) | INTRAMUSCULAR | Status: DC | PRN
  Filled 2017-01-04: qty 2

## 2017-01-04 MED ORDER — KETOROLAC TROMETHAMINE 30 MG/ML IJ SOLN
INTRAMUSCULAR | Status: DC | PRN
  Administered 2017-01-04: 30 mg via INTRAVENOUS

## 2017-01-04 MED ORDER — ACETAMINOPHEN 10 MG/ML IV SOLN
INTRAVENOUS | Status: DC | PRN
  Administered 2017-01-04: 1000 mg via INTRAVENOUS

## 2017-01-04 MED ORDER — PROMETHAZINE HCL 25 MG/ML IJ SOLN: 6.2500 mg | INTRAMUSCULAR | Status: DC | PRN

## 2017-01-04 MED ORDER — ONDANSETRON 4 MG PO TBDP: 4.0000 mg | ORAL_TABLET | Freq: Four times a day (QID) | ORAL | Status: DC | PRN

## 2017-01-04 MED ORDER — SODIUM CHLORIDE 0.9 % IV BOLUS (SEPSIS)
1000.0000 mL | Freq: Once | INTRAVENOUS | Status: AC
  Administered 2017-01-04: 1000 mL via INTRAVENOUS

## 2017-01-04 MED ORDER — EPHEDRINE SULFATE 50 MG/ML IJ SOLN
INTRAMUSCULAR | Status: DC | PRN
  Administered 2017-01-04: 20 mg via INTRAVENOUS

## 2017-01-04 MED ORDER — HYDROMORPHONE HCL 1 MG/ML IJ SOLN
0.5000 mg | INTRAMUSCULAR | Status: DC | PRN
  Administered 2017-01-04 – 2017-01-05 (×2): 0.5 mg via INTRAVENOUS
  Filled 2017-01-04 (×2): qty 0.5

## 2017-01-04 MED ORDER — ACETAMINOPHEN 500 MG PO TABS
1000.0000 mg | ORAL_TABLET | Freq: Four times a day (QID) | ORAL | Status: DC
  Administered 2017-01-04 – 2017-01-06 (×4): 1000 mg via ORAL
  Filled 2017-01-04 (×5): qty 2

## 2017-01-04 MED ORDER — POTASSIUM CHLORIDE CRYS ER 20 MEQ PO TBCR
40.0000 meq | EXTENDED_RELEASE_TABLET | Freq: Once | ORAL | Status: AC
  Administered 2017-01-04: 40 meq via ORAL
  Filled 2017-01-04: qty 2

## 2017-01-04 MED ORDER — ENOXAPARIN SODIUM 40 MG/0.4ML ~~LOC~~ SOLN
40.0000 mg | SUBCUTANEOUS | Status: DC
  Administered 2017-01-04 – 2017-01-05 (×2): 40 mg via SUBCUTANEOUS
  Filled 2017-01-04 (×3): qty 0.4

## 2017-01-04 MED ORDER — BUPIVACAINE HCL (PF) 0.5 % IJ SOLN
INTRAMUSCULAR | Status: DC | PRN
  Administered 2017-01-04: 30 mL

## 2017-01-04 MED ORDER — BUPIVACAINE LIPOSOME 1.3 % IJ SUSP
INTRAMUSCULAR | Status: AC
  Filled 2017-01-04: qty 20

## 2017-01-04 MED ORDER — LIDOCAINE HCL (CARDIAC) 20 MG/ML IV SOLN
INTRAVENOUS | Status: DC | PRN
  Administered 2017-01-04: 80 mg via INTRAVENOUS

## 2017-01-04 MED ORDER — FENTANYL CITRATE (PF) 100 MCG/2ML IJ SOLN
INTRAMUSCULAR | Status: AC
  Filled 2017-01-04: qty 2

## 2017-01-04 MED ORDER — PIPERACILLIN-TAZOBACTAM 3.375 G IVPB 30 MIN
3.3750 g | Freq: Once | INTRAVENOUS | Status: AC
  Administered 2017-01-04: 3.375 g via INTRAVENOUS

## 2017-01-04 MED ORDER — HYDRALAZINE HCL 20 MG/ML IJ SOLN: 10.0000 mg | INTRAMUSCULAR | Status: DC | PRN

## 2017-01-04 MED ORDER — ONDANSETRON HCL 4 MG/2ML IJ SOLN
4.0000 mg | Freq: Four times a day (QID) | INTRAMUSCULAR | Status: DC | PRN
  Administered 2017-01-04 (×2): 4 mg via INTRAVENOUS
  Filled 2017-01-04 (×2): qty 2

## 2017-01-04 MED ORDER — SODIUM CHLORIDE 0.9 % IV SOLN
3.0000 g | Freq: Four times a day (QID) | INTRAVENOUS | Status: AC
  Administered 2017-01-04 (×3): 3 g via INTRAVENOUS
  Filled 2017-01-04 (×3): qty 3

## 2017-01-04 MED ORDER — PANTOPRAZOLE SODIUM 40 MG IV SOLR
40.0000 mg | Freq: Two times a day (BID) | INTRAVENOUS | Status: DC
  Administered 2017-01-04 – 2017-01-06 (×5): 40 mg via INTRAVENOUS
  Filled 2017-01-04 (×5): qty 40

## 2017-01-04 MED ORDER — OXYCODONE HCL 5 MG/5ML PO SOLN: 5.0000 mg | Freq: Once | ORAL | Status: DC | PRN

## 2017-01-04 MED ORDER — PROPOFOL 10 MG/ML IV BOLUS
INTRAVENOUS | Status: DC | PRN
  Administered 2017-01-04: 160 mg via INTRAVENOUS

## 2017-01-04 MED ORDER — LACTATED RINGERS IV SOLN
INTRAVENOUS | Status: DC | PRN
  Administered 2017-01-04: 02:00:00 via INTRAVENOUS

## 2017-01-04 MED ORDER — HYDROMORPHONE HCL 1 MG/ML IJ SOLN
0.2500 mg | INTRAMUSCULAR | Status: DC | PRN
  Administered 2017-01-04 (×2): 0.5 mg via INTRAVENOUS

## 2017-01-04 MED ORDER — OXYCODONE HCL 5 MG PO TABS
5.0000 mg | ORAL_TABLET | ORAL | Status: DC | PRN
  Administered 2017-01-06: 5 mg via ORAL
  Filled 2017-01-04: qty 1

## 2017-01-04 MED ORDER — LACTATED RINGERS IV SOLN
INTRAVENOUS | Status: DC
  Administered 2017-01-04 – 2017-01-05 (×4): via INTRAVENOUS

## 2017-01-04 SURGICAL SUPPLY — 45 items
APPLIER CLIP 11 MED OPEN (CLIP)
APPLIER CLIP 13 LRG OPEN (CLIP)
BLADE CLIPPER SURG (BLADE) ×5 IMPLANT
BLADE SURG 15 STRL LF DISP TIS (BLADE) ×3 IMPLANT
BLADE SURG 15 STRL SS (BLADE) ×2
CANISTER SUCT 3000ML PPV (MISCELLANEOUS) ×5 IMPLANT
CHLORAPREP W/TINT 26ML (MISCELLANEOUS) ×5 IMPLANT
CLIP APPLIE 11 MED OPEN (CLIP) IMPLANT
CLIP APPLIE 13 LRG OPEN (CLIP) IMPLANT
DRAPE LAPAROTOMY 100X77 ABD (DRAPES) ×5 IMPLANT
DRAPE TABLE BACK 80X90 (DRAPES) IMPLANT
DRSG OPSITE POSTOP 4X10 (GAUZE/BANDAGES/DRESSINGS) ×5 IMPLANT
DRSG TEGADERM 2-3/8X2-3/4 SM (GAUZE/BANDAGES/DRESSINGS) IMPLANT
DRSG TELFA 3X8 NADH (GAUZE/BANDAGES/DRESSINGS) IMPLANT
ELECT BLADE 6.5 EXT (BLADE) ×5 IMPLANT
ELECT REM PT RETURN 9FT ADLT (ELECTROSURGICAL) ×5
ELECTRODE REM PT RTRN 9FT ADLT (ELECTROSURGICAL) ×3 IMPLANT
GAUZE SPONGE 4X4 12PLY STRL (GAUZE/BANDAGES/DRESSINGS) IMPLANT
GLOVE BIO SURGEON STRL SZ7 (GLOVE) ×30 IMPLANT
GOWN STRL REUS W/ TWL LRG LVL3 (GOWN DISPOSABLE) ×9 IMPLANT
GOWN STRL REUS W/TWL LRG LVL3 (GOWN DISPOSABLE) ×6
HANDLE SUCTION POOLE (INSTRUMENTS) IMPLANT
HANDLE YANKAUER SUCT BULB TIP (MISCELLANEOUS) ×5 IMPLANT
LIGASURE IMPACT 36 18CM CVD LR (INSTRUMENTS) IMPLANT
NEEDLE HYPO 22GX1.5 SAFETY (NEEDLE) ×10 IMPLANT
NEEDLE HYPO 25X1 1.5 SAFETY (NEEDLE) ×5 IMPLANT
PACK BASIN MAJOR ARMC (MISCELLANEOUS) ×5 IMPLANT
RELOAD PROXIMATE 75MM BLUE (ENDOMECHANICALS) IMPLANT
SPONGE LAP 18X18 5 PK (GAUZE/BANDAGES/DRESSINGS) ×5 IMPLANT
SPONGE LAP 18X36 2PK (MISCELLANEOUS) ×5 IMPLANT
STAPLER PROXIMATE 75MM BLUE (STAPLE) IMPLANT
STAPLER SKIN PROX 35W (STAPLE) IMPLANT
SUCTION POOLE HANDLE (INSTRUMENTS)
SUT PDS AB 1 TP1 96 (SUTURE) ×15 IMPLANT
SUT SILK 2 0 (SUTURE)
SUT SILK 2 0 SH CR/8 (SUTURE) ×5 IMPLANT
SUT SILK 2 0SH CR/8 30 (SUTURE) ×5 IMPLANT
SUT SILK 2-0 18XBRD TIE 12 (SUTURE) IMPLANT
SUT VIC AB 0 CT1 36 (SUTURE) ×5 IMPLANT
SUT VIC AB 2-0 SH 27 (SUTURE)
SUT VIC AB 2-0 SH 27XBRD (SUTURE) IMPLANT
SYR 30ML LL (SYRINGE) ×10 IMPLANT
SYR 3ML LL SCALE MARK (SYRINGE) ×5 IMPLANT
TAPE MICROFOAM 4IN (TAPE) IMPLANT
TRAY FOLEY W/METER SILVER 16FR (SET/KITS/TRAYS/PACK) ×5 IMPLANT

## 2017-01-04 NOTE — H&P (Signed)
Patient ID: Athziri Freundlich, female   DOB: 1965/11/02, 51 y.o.   MRN: [id number]  History of Present Illness Ilean Spradlin is a 51 y.o. female severe with an acute onset of abdominal pain that started last night. Patient reports the pain is severe, colicky type with no specific alleviating nor aggravating factors. Pain is mainly to the left side of her abdomen. Some nausea but no emesis. CT scan personal review there is evidence of an internal hernia involving the sigmoid colon that is redundant. There is no evidence of perforation there is no evidence of free air. She does have sickle cell anemia and a history of stroke and a mitral valve prolapse. Her hemoglobin is 12.4 with a white count of 4.8. Creatinine is normal. She had a milder episode about a year ago that resolved on his own. No previous abdominal operations  Past Medical History Past Medical History:  Diagnosis Date  . Anemia   . Arthritis   . Mitral valve prolapse   . Ovarian cyst   . Sickle cell trait ([place])   . Stroke [facility])        Past Surgical History:  Procedure Laterality Date  . CARDIAC CATHETERIZATION     x3 last in 2014  . ENDOMETRIAL ABLATION  2014   [facility]  . fibroid cyst      No Known Allergies  Current Facility-Administered Medications  Medication Dose Route Frequency Provider Last Rate Last Dose  . piperacillin-tazobactam (ZOSYN) IVPB 3.375 g  3.375 g Intravenous Once Nance Pear, MD 100 mL/hr at 01/04/17 0104 3.375 g at 01/04/17 0104   Current Outpatient Prescriptions  Medication Sig Dispense Refill  . HYDROcodone-acetaminophen (NORCO) 5-325 MG tablet Take 1 tablet by mouth every 6 (six) hours as needed for moderate pain. 15 tablet 0  . ibuprofen (ADVIL,MOTRIN) 800 MG tablet Take 1 tablet (800 mg total) by mouth every 8 (eight) hours as needed for moderate pain. 15 tablet 0  . lidocaine (LIDODERM) 5 % Place 1 patch onto the skin every 12 (twelve) hours. Remove & Discard patch within 12 hours or as  directed by MD 10 patch 0  . methocarbamol (ROBAXIN) 500 MG tablet Take 1 tablet (500 mg total) by mouth 4 (four) times daily. 16 tablet 0  . ondansetron (ZOFRAN ODT) 4 MG disintegrating tablet Take 1 tablet (4 mg total) by mouth every 8 (eight) hours as needed for nausea or vomiting. (Patient not taking: Reported on 08/01/2016) 20 tablet 0  . oxyCODONE-acetaminophen (ROXICET) 5-325 MG tablet Take 1 tablet by mouth every 4 (four) hours as needed for severe pain. (Patient not taking: Reported on 08/01/2016) 20 tablet 0  . predniSONE (DELTASONE) 10 MG tablet Take 1 tablet (10 mg total) by mouth daily. 42 tablet 0    Family History Family History  Problem Relation Age of Onset  . Lung cancer Mother   . Leukemia Father   . Sickle cell anemia Brother   . Colon cancer Maternal Grandmother   . Breast cancer Maternal Aunt   . Sickle cell anemia Daughter       Social History Social History  Substance Use Topics  . Smoking status: Never Smoker  . Smokeless tobacco: Never Used  . Alcohol use No       ROS Full ROS of systems performed and is otherwise negative there than what is stated in the HPI  Physical Exam Blood pressure (!) 162/86, pulse 67, temperature 99 F (37.2 C), temperature source Oral, resp. rate 18, height  5\' 9"  (1.753 m), weight 97.5 kg (215 lb), SpO2 99 %.  CONSTITUTIONAL: She is in pain EYES: Pupils equal, round, and reactive to light, Sclera non-icteric. EARS, NOSE, MOUTH AND THROAT: The oropharynx is clear. Oral mucosa is pink and moist. Hearing is intact to voice.  NECK: Trachea is midline, and there is no jugular venous distension. Thyroid is without palpable abnormalities. LYMPH NODES:  Lymph nodes in the neck are not enlarged. RESPIRATORY:  Lungs are clear, and breath sounds are equal bilaterally. Normal respiratory effort without pathologic use of accessory muscles. CARDIOVASCULAR: Heart is regular without murmurs, gallops, or rubs. GI: The abdomen is  soft,  Tender to palpation w rebound tenderness. Mainly on the left flank and left LQ. No scars GU: deferred MUSCULOSKELETAL:  Normal muscle strength and tone in all four extremities.    SKIN: Skin turgor is normal. There are no pathologic skin lesions.  NEUROLOGIC:  Motor and sensation is grossly normal.  Cranial nerves are grossly intact. PSYCH:  Alert and oriented to person, place and time. Affect is normal.  Data Reviewed   I have personally reviewed the patient's imaging and medical records.    Assessment/Plan Internal hernia involving the sigmoid colon causing acute abdomen. Given these findings I recommended culture laparotomy with repair of internal hernia and possible colostomy depending on operative findings. Discussed with patient in detail about her disease process and about the proposed procedure. Risks, benefits and possible complications including but not limited to: Bleeding, infection, colostomy creation, re-interventions, anastomotic leak and injury to internal organs were explained to the patient detail. We will also start balls of crystalloids and broad-spectrum IV antibiotics and posterior for laparotomy tonight  Diego pabon, MD [place] 01/04/2017, 1:05 AM

## 2017-01-04 NOTE — Progress Notes (Signed)
01/04/2017  Subjective: Patient is s/p exploratory laparotomy overnight for acute abdomen.  Was noted to have some adhesions from redundant sigmoid colon.  No bowel resection was done.  Today she reports having pain at her incision.  No nausea or flatus yet.  Vital signs: Temp:  [94.4 F (34.7 C)-99 F (37.2 C)] 97.5 F (36.4 C) (07/30 0849) Pulse Rate:  [59-80] 65 (07/30 0849) Resp:  [12-23] 14 (07/30 0849) BP: (134-182)/(69-106) 140/69 (07/30 0849) SpO2:  [92 %-100 %] 95 % (07/30 0849) Weight:  [97.5 kg (215 lb)] 97.5 kg (215 lb) (07/29 1807)   Intake/Output: 07/29 0701 - 07/30 0700 In: 1200 [I.V.:1200] Out: 725 [Urine:700; Blood:25]    Physical Exam: Constitutional: No acute distress Abdomen:  Soft, nondistended, appropriately tender to palpation.  Midline incision clean, dry, intact with staples and honeycomb dressing.  Labs:   Recent Labs  01/03/17 1816 01/04/17 0412  WBC 4.8 8.2  HGB 12.4 12.5  HCT 37.9 38.4  PLT 225 200    Recent Labs  01/03/17 1816 01/04/17 0412  NA 139 140  K 3.7 3.3*  CL 106 105  CO2 29 29  GLUCOSE 117* 168*  BUN 10 9  CREATININE 0.87 0.79  CALCIUM 9.2 8.4*   No results for input(s): LABPROT, INR in the last 72 hours.  Imaging: Ct Abdomen Pelvis W Contrast  Result Date: 01/03/2017 CLINICAL DATA:  51 year old female with left-sided abdominal pain under distention. EXAM: CT ABDOMEN AND PELVIS WITH CONTRAST TECHNIQUE: Multidetector CT imaging of the abdomen and pelvis was performed using the standard protocol following bolus administration of intravenous contrast. CONTRAST:  123mL ISOVUE-300 IOPAMIDOL (ISOVUE-300) INJECTION 61% COMPARISON:  Abdominal CT dated 06/21/2013 and pelvic ultrasound dated 10/26/2016 FINDINGS: Lower chest: The visualized lung bases are clear. No intra-abdominal free air or free fluid. Hepatobiliary: No focal liver abnormality is seen. No gallstones, gallbladder wall thickening, or biliary dilatation. Pancreas:  Unremarkable. No pancreatic ductal dilatation or surrounding inflammatory changes. Spleen: Normal in size without focal abnormality. Adrenals/Urinary Tract: Adrenal glands are unremarkable. Kidneys are normal, without renal calculi, focal lesion, or hydronephrosis. Bladder is unremarkable. Stomach/Bowel: There is slight redundancy of the sigmoid colon. There is extension of a segment of sigmoid colon through an apparent defect in the mesentery. There is however no evidence of twisting or obstruction. This segment of the sigmoid colon contains air. There is moderate amount of stool within the colon proximal to this segment of the sigmoid colon. Stool is also noted within the rectosigmoid. No bowel obstruction or active inflammation. Normal appendix. Vascular/Lymphatic: No significant vascular findings are present. No enlarged abdominal or pelvic lymph nodes. Reproductive: The uterus is mildly enlarged. Multiple uterine fibroids noted. The ovaries are grossly unremarkable as visualized. Other: Small fat containing umbilical hernia. Musculoskeletal: No acute or significant osseous findings. IMPRESSION: 1. Redundant sigmoid colon with extension of a segment of sigmoid through an apparent mesentery defect. No evidence of twisting or obstruction. Moderate colonic stool burden. Normal appendix. 2. Mildly enlarged and myomatous uterus. Electronically Signed   By: Anner Crete M.D.   On: 01/03/2017 23:25    Assessment/Plan: 51 yo female s/p exploratory laparotomy  --will d/c NG tube and foley catheter today --continue IV fluids --Start clear liquid diet --OOB, pain control.   Melvyn Neth, [place]

## 2017-01-04 NOTE — ED Provider Notes (Signed)
-----------------------------------------   1:20 AM on 01/04/2017 -----------------------------------------   Blood pressure (!) 150/98, pulse 68, temperature 99 F (37.2 C), temperature source Oral, resp. rate (!) 23, height 5\' 9"  (1.753 m), weight 97.5 kg (215 lb), SpO2 96 %.  Assuming care from Dr. Archie Balboa.  In short, Pam Snyder is a 51 y.o. female with a chief complaint of Abdominal Pain .  Refer to the original H&P for additional details.  The current plan of care is to follow up the recommendations by surgery.     The patient was seen by surgery and the decision was made take the patient to the operating room. The patient will be admitted to the surgical service and will go to the operating room.   Loney Hering, MD 01/04/17 [phone]

## 2017-01-04 NOTE — Op Note (Addendum)
PROCEDURES: 1. Laparotomy with Lysis of adhesions 2. 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 with endotracheal tube  Findings: Redundant sigmoid colon w partial volvulus with never mesenteric attachment. The attachment was adhered to the pelvic wall  Reducible UH Viable sigmoid colon, no masses No evidence of mesenteric defects   Estimated Blood Loss: 10cc                Specimens: none       Complications: none               Condition: stable  Procedure Details  The patient was seen again in the Holding Room. The benefits, complications, treatment options, and expected outcomes were discussed with the patient. The risks of bleeding, infection, recurrence of symptoms, failure to resolve symptoms,  bowel injury, any of which could require further surgery were reviewed with the patient.   The patient was taken to Operating Room, identified as Pam Snyder and the procedure verified.  A Time Out was held and the above information confirmed.  Prior to the induction of general anesthesia, antibiotic prophylaxis was administered. VTE prophylaxis was in place. General endotracheal anesthesia was then administered and tolerated well. After the induction, the abdomen was prepped with Chloraprep and draped in the sterile fashion. The patient was positioned in the supine position.  Minilaparotomy performed with a 10 blade knife and electrocautery was used to dissect through subcutaneous tissue. The fascia was incised and we encounter an umbilical hernia. The hernia sac was excised in the standard fashion with cautery.  Inspection of the abdomen revealed a redundant sigmoid colon with a partial [name] caused by narrowing of mesenteric  attachment  to the pelvic wall.  Using a combination of electrocautery and scissors were able to lyse these adhesions to free up that portion that was partially mobilizing. Upon doing that there was adequate mobilization of the sigmoid without any kinks or volvulized segments.  There was no evidence of any mesenteric defects.  The cavity was irrigated and the fascia was closed with a running #1 PDS in the standard fashion, the umbilical hernia was closed with the running PDS in the standard fashion. Marcaine Was Injected along the Incision. Subcutaneous Incision Was Irrigated and the Skin Was Closed with Staplers.   Needle and laparotomy count were correct and there were no immediate occasions  Caroleen Hamman, MD, FACS

## 2017-01-04 NOTE — Anesthesia Preprocedure Evaluation (Addendum)
Anesthesia Evaluation  Patient identified by MRN, date of birth, ID band Patient awake    Reviewed: Allergy & Precautions, H&P , NPO status , Patient's Chart, lab work & pertinent test results  History of Anesthesia Complications Negative for: history of anesthetic complications  Airway Mallampati: III  TM Distance: >3 FB Neck ROM: full    Dental  (+) Poor Dentition, Chipped, Missing   Pulmonary neg pulmonary ROS, neg shortness of breath,           Cardiovascular Exercise Tolerance: Good (-) angina(-) Past MI and (-) DOE negative cardio ROS       Neuro/Psych CVA negative neurological ROS  negative psych ROS   GI/Hepatic negative GI ROS, Neg liver ROS, neg GERD  ,  Endo/Other  negative endocrine ROS  Renal/GU      Musculoskeletal  (+) Arthritis ,   Abdominal   Peds  Hematology negative hematology ROS (+)   Anesthesia Other Findings Past Medical History: No date: Anemia No date: Arthritis No date: Mitral valve prolapse No date: Ovarian cyst No date: Sickle cell trait (HCC) No date: Stroke [facility])  Past Surgical History: No date: CARDIAC CATHETERIZATION     Comment:  x3 last in 2014 2014: ENDOMETRIAL ABLATION     Comment:  [facility] No date: fibroid cyst  BMI    Body Mass Index:  31.75 kg/m      Reproductive/Obstetrics negative OB ROS                             Anesthesia Physical Anesthesia Plan  ASA: III and emergent  Anesthesia Plan: General ETT, Rapid Sequence and Cricoid Pressure   Post-op Pain Management:    Induction: Intravenous  PONV Risk Score and Plan: 3 and Ondansetron, Dexamethasone and Midazolam  Airway Management Planned: Oral ETT  Additional Equipment:   Intra-op Plan:   Post-operative Plan: Extubation in OR  Informed Consent: I have reviewed the patients History and Physical, chart, labs and discussed the procedure including the risks, benefits and  alternatives for the proposed anesthesia with the patient or authorized representative who has indicated his/her understanding and acceptance.   Dental Advisory Given  Plan Discussed with: Anesthesiologist, CRNA and Surgeon  Anesthesia Plan Comments: (Patient informed that they are higher risk for complications from anesthesia during this procedure due to their medical history.  Patient voiced understanding.  Patient consented for risks of anesthesia including but not limited to:  - adverse reactions to medications - damage to teeth, lips or other oral mucosa - sore throat or hoarseness - Damage to heart, brain, lungs or loss of life  Patient voiced understanding.)       Anesthesia Quick Evaluation

## 2017-01-04 NOTE — Anesthesia Postprocedure Evaluation (Signed)
Anesthesia Post Note  Patient: [name]  Procedure(s) Performed: Procedure(s) (LRB): EXPLORATORY LAPAROTOMY (N/A) LYSIS OF ADHESION HERNIA REPAIR  Patient location during evaluation: PACU Anesthesia Type: General Level of consciousness: awake and alert Pain management: pain level controlled Vital Signs Assessment: post-procedure vital signs reviewed and stable Respiratory status: spontaneous breathing, nonlabored ventilation, respiratory function stable and patient connected to nasal cannula oxygen Cardiovascular status: blood pressure returned to baseline and stable Postop Assessment: no signs of nausea or vomiting Anesthetic complications: no     Last Vitals:  Vitals:   01/04/17 0435 01/04/17 0526  BP: (!) 146/82 (!) 144/77  Pulse: 62 60  Resp:  18  Temp: (!) 36 C (!) 36.4 C    Last Pain:  Vitals:   01/04/17 0618  TempSrc:   PainSc: Asleep                 Precious Haws Joi Leyva

## 2017-01-04 NOTE — Anesthesia Post-op Follow-up Note (Cosign Needed)
Anesthesia QCDR form completed.        

## 2017-01-04 NOTE — Anesthesia Procedure Notes (Signed)
Procedure Name: Intubation Date/Time: 01/04/2017 1:45 AM Performed by: YELYHTM, Aaleah Hirsch Pre-anesthesia Checklist: Timeout performed, Patient being monitored, Suction available, Patient identified and Emergency Drugs available Patient Re-evaluated:Patient Re-evaluated prior to induction Oxygen Delivery Method: Circle system utilized Preoxygenation: Pre-oxygenation with 100% oxygen Induction Type: IV induction and Rapid sequence Laryngoscope Size: Mac and 3 Grade View: Grade I Tube type: Oral Tube size: 7.0 mm Number of attempts: 1 Placement Confirmation: ETT inserted through vocal cords under direct vision,  positive ETCO2,  breath sounds checked- equal and bilateral and CO2 detector Secured at: 22 cm Tube secured with: Tape

## 2017-01-04 NOTE — Transfer of Care (Signed)
Immediate Anesthesia Transfer of Care Note  Patient: Pam Snyder  Procedure(s) Performed: Procedure(s): EXPLORATORY LAPAROTOMY (N/A) LYSIS OF ADHESION HERNIA REPAIR  Patient Location: PACU  Anesthesia Type:General  Level of Consciousness: awake and alert   Airway & Oxygen Therapy: Patient Spontanous Breathing and Patient connected to face mask oxygen  Post-op Assessment: Report given to RN  Post vital signs: Reviewed and stable  Last Vitals:  Vitals:   01/04/17 0102 01/04/17 0256  BP: (!) 150/98 (!) 160/100  Pulse: 68 80  Resp: (!) 23 20  Temp:  (!) 36.4 C    Last Pain:  Vitals:   01/04/17 0038  TempSrc:   PainSc: 7          Complications: No apparent anesthesia complications

## 2017-01-05 LAB — BASIC METABOLIC PANEL
Anion gap: 6 (ref 5–15)
BUN: 7 mg/dL (ref 6–20)
CHLORIDE: 107 mmol/L (ref 101–111)
CO2: 26 mmol/L (ref 22–32)
CREATININE: 0.71 mg/dL (ref 0.44–1.00)
Calcium: 8.6 mg/dL — ABNORMAL LOW (ref 8.9–10.3)
GFR calc Af Amer: >60 mL/min (ref >60)
GFR calc non Af Amer: >60 mL/min (ref >60)
Glucose, Bld: 111 mg/dL — ABNORMAL HIGH (ref 65–99)
POTASSIUM: 3.9 mmol/L (ref 3.5–5.1)
Sodium: 139 mmol/L (ref 135–145)

## 2017-01-05 LAB — CBC WITH DIFFERENTIAL/PLATELET
Basophils Absolute: 0 10*3/uL (ref 0–0.1)
Basophils Relative: 0 %
EOS ABS: 0 10*3/uL (ref 0–0.7)
Eosinophils Relative: 0 %
HEMATOCRIT: 33.4 % — AB (ref 35.0–47.0)
HEMOGLOBIN: 10.8 g/dL — AB (ref 12.0–16.0)
LYMPHS ABS: 0.9 10*3/uL — AB (ref 1.0–3.6)
LYMPHS PCT: 10 %
MCH: 23.3 pg — AB (ref 26.0–34.0)
MCHC: 32.3 g/dL (ref 32.0–36.0)
MCV: 72.1 fL — AB (ref 80.0–100.0)
MONOS PCT: 6 %
Monocytes Absolute: 0.5 10*3/uL (ref 0.2–0.9)
NEUTROS PCT: 84 %
Neutro Abs: 7.1 10*3/uL — ABNORMAL HIGH (ref 1.4–6.5)
Platelets: 205 10*3/uL (ref 150–440)
RBC: 4.63 MIL/uL (ref 3.80–5.20)
RDW: 14.7 % — ABNORMAL HIGH (ref 11.5–14.5)
WBC: 8.6 10*3/uL (ref 3.6–11.0)

## 2017-01-05 LAB — HIV ANTIBODY (ROUTINE TESTING W REFLEX): HIV Screen 4th Generation wRfx: NONREACTIVE

## 2017-01-05 LAB — MAGNESIUM: MAGNESIUM: 2.2 mg/dL (ref 1.7–2.4)

## 2017-01-05 NOTE — Progress Notes (Signed)
01/05/2017  Subjective: Patient is 1 Day Post-Op status post exploratory laparotomy with lysis of adhesions. She reports that her pain is doing better today compared to yesterday she was able to walk. She is tolerating clears but has had no flatus yet.  Vital signs: Temp:  [97.8 F (36.6 C)-98.4 F (36.9 C)] 98.4 F (36.9 C) (07/31 0447) Pulse Rate:  [73-83] 78 (07/31 0447) Resp:  [20] 20 (07/31 0447) BP: (138-150)/(64-75) 141/71 (07/31 0447) SpO2:  [95 %-99 %] 99 % (07/31 0447)   Intake/Output: No intake/output data recorded. Last BM Date: 01/03/17  Physical Exam: Constitutional: No acute distress Abdomen: Soft, nondistended, appropriately tender to palpation. Incision is clean dry and intact with staples and honeycomb dressing in place. no evidence of infection.  Labs:   Recent Labs  01/04/17 0412 01/05/17 0432  WBC 8.2 8.6  HGB 12.5 10.8*  HCT 38.4 33.4*  PLT 200 205    Recent Labs  01/04/17 0412 01/05/17 0432  NA 140 139  K 3.3* 3.9  CL 105 107  CO2 29 26  GLUCOSE 168* 111*  BUN 9 7  CREATININE 0.79 0.71  CALCIUM 8.4* 8.6*   No results for input(s): LABPROT, INR in the last 72 hours.  Imaging: No results found.  Assessment/Plan: 51 year old female status post exploratory laparotomy.  -We'll continue clear liquids today while awaiting for return of bowel function. -Decrease IV fluids and we'll discontinue one she's tolerating appropriate clear liquid intake. -Continue out of bed and ambulation   Melvyn Neth, [place]

## 2017-01-06 MED ORDER — OXYCODONE HCL 5 MG PO TABS: 5.0000 mg | ORAL_TABLET | ORAL | 0 refills | Status: DC | PRN

## 2017-01-06 NOTE — Progress Notes (Signed)
Chaplain rounding unit visited with pt. Pt was have a conversation with a charge nurse and pt's son was sleeping on the chair beside pt. [place] did not want to interrupt the conversation between pt and RN so will visit with pt to provide pastoral care to pt as needed.   01/06/17 1600  Clinical Encounter Type  Visited With Patient and family together;Health care provider  Visit Type Initial;Spiritual support  Referral From [place];Other (Comment)

## 2017-01-06 NOTE — Care Management (Signed)
Patient s/p laparotomy.  Patient list as self pay.  PCP [facility].  Pharmacy [facility].  RNCM following for discharge needs

## 2017-01-06 NOTE — Care Management (Signed)
Patient to discharge with script for pain medication no RNCM needs identified.  RNCM signing off

## 2017-01-06 NOTE — Discharge Summary (Signed)
Patient ID: Pam Snyder MRN: [id number] DOB/AGE: Aug 09, 1965 51 y.o.  Admit date: 01/03/2017 Discharge date: 01/06/2017   Discharge Diagnoses:  Active Problems:   Abdominal pain   Sigmoid volvulus (HCC)   Procedures:  Exploratory laparotomy, lysis of adhesions, repair of umbilical hernia  Hospital Course:  Patient was admitted on 7/29 with abdominal pain and CT scan findings suspicious for a possible internal hernia.  She was taken to the OR and had an exploratory laparotomy with lysis of adhesions.  Post-operatively, she recovered well and her diet was advanced slowly from clears to regular after return of bowel function.  Her pain was well controlled on oral medication.  She was ambulating, voiding without issues, tolerating a regular diet, and was deemed ready for discharge to home.  Consults: None  Disposition: 01-Home or Self Care  Discharge Instructions    Call MD for:  difficulty breathing, headache or visual disturbances    Complete by:  As directed    Call MD for:  persistant nausea and vomiting    Complete by:  As directed    Call MD for:  redness, tenderness, or signs of infection (pain, swelling, redness, odor or green/yellow discharge around incision site)    Complete by:  As directed    Call MD for:  severe uncontrolled pain    Complete by:  As directed    Call MD for:  temperature >100.4    Complete by:  As directed    Diet - low sodium heart healthy    Complete by:  As directed    Discharge instructions    Complete by:  As directed    1.  Patient may shower, but do not scrub wound heavily and dab dry only. 2.  Do not submerge wound in pool/tub. 3.  Do not remove staples.   Driving Restrictions    Complete by:  As directed    Do not drive while taking narcotics for pain control.   Increase activity slowly    Complete by:  As directed    Lifting restrictions    Complete by:  As directed    No heavy lifting or pushing of more than 10-15 lbs for 4 weeks.   No dressing needed    Complete by:  As directed      Allergies as of 01/06/2017   No Known Allergies     Medication List    STOP taking these medications   HYDROcodone-acetaminophen 5-325 MG tablet Commonly known as:  NORCO   ondansetron 4 MG disintegrating tablet Commonly known as:  ZOFRAN ODT   oxyCODONE-acetaminophen 5-325 MG tablet Commonly known as:  ROXICET     TAKE these medications   ibuprofen 800 MG tablet Commonly known as:  ADVIL,MOTRIN Take 1 tablet (800 mg total) by mouth every 8 (eight) hours as needed for moderate pain.   lidocaine 5 % Commonly known as:  LIDODERM Place 1 patch onto the skin every 12 (twelve) hours. Remove & Discard patch within 12 hours or as directed by MD   methocarbamol 500 MG tablet Commonly known as:  ROBAXIN Take 1 tablet (500 mg total) by mouth 4 (four) times daily.   oxyCODONE 5 MG immediate release tablet Commonly known as:  Oxy IR/ROXICODONE Take 1-2 tablets (5-10 mg total) by mouth every 4 (four) hours as needed for moderate pain.   predniSONE 10 MG tablet Commonly known as:  DELTASONE Take 1 tablet (10 mg total) by mouth daily.      Follow-up  Information    Pabon, [place] F, MD Follow up in 1 week(s).   Specialty:  General Surgery Why:  For staple removal. Contact information: [address] [place] [address] [phone]

## 2017-01-06 NOTE — Progress Notes (Signed)
01/06/2017  Subjective: Patient is 2 Days Post-Op status post expiratory laparotomy with lysis of adhesions. She had a bowel movement yesterday. Tolerated clears with no problems. Ambulating.  Vital signs: Temp:  [97.5 F (36.4 C)-98.1 F (36.7 C)] 97.5 F (36.4 C) (08/01 0452) Pulse Rate:  [68-108] 68 (08/01 0452) Resp:  [18-21] 18 (08/01 0452) BP: (138-149)/(71-81) 138/71 (08/01 0452) SpO2:  [74 %-100 %] 100 % (08/01 0452)   Intake/Output: 07/31 0701 - 08/01 0700 In: 3358 [P.O.:290; I.V.:4601] Out: 0  Last BM Date: 01/05/17  Physical Exam: Constitutional: No acute distress Abdomen: Soft, nondistended, appropriately tender to palpation. Midline incision is clean dry and intact. Dressing removed.  Labs:   Recent Labs  01/04/17 0412 01/05/17 0432  WBC 8.2 8.6  HGB 12.5 10.8*  HCT 38.4 33.4*  PLT 200 205    Recent Labs  01/04/17 0412 01/05/17 0432  NA 140 139  K 3.3* 3.9  CL 105 107  CO2 29 26  GLUCOSE 168* 111*  BUN 9 7  CREATININE 0.79 0.71  CALCIUM 8.4* 8.6*   No results for input(s): LABPROT, INR in the last 72 hours.  Imaging: No results found.  Assessment/Plan: 51 year old female status post expiratory laparotomy.  -We will advance to regular diet today. -Discontinue IV fluids. -Possibly discharged to home today if the patient tolerates diet well.   Melvyn Neth, [place]

## 2017-01-07 ENCOUNTER — Telehealth: Payer: Self-pay

## 2017-01-07 NOTE — Telephone Encounter (Signed)
error 

## 2017-01-12 ENCOUNTER — Other Ambulatory Visit: Payer: Self-pay

## 2017-01-12 DIAGNOSIS — M51369 Other intervertebral disc degeneration, lumbar region without mention of lumbar back pain or lower extremity pain: Secondary | ICD-10-CM

## 2017-01-12 DIAGNOSIS — M5030 Other cervical disc degeneration, unspecified cervical region: Secondary | ICD-10-CM | POA: Insufficient documentation

## 2017-01-12 DIAGNOSIS — M5136 Other intervertebral disc degeneration, lumbar region: Secondary | ICD-10-CM

## 2017-01-12 HISTORY — DX: Other cervical disc degeneration, unspecified cervical region: M50.30

## 2017-01-12 HISTORY — DX: Other intervertebral disc degeneration, lumbar region: M51.36

## 2017-01-12 HISTORY — DX: Other intervertebral disc degeneration, lumbar region without mention of lumbar back pain or lower extremity pain: M51.369

## 2017-01-14 ENCOUNTER — Ambulatory Visit (INDEPENDENT_AMBULATORY_CARE_PROVIDER_SITE_OTHER): Payer: Self-pay | Admitting: General Surgery

## 2017-01-14 ENCOUNTER — Encounter: Payer: Self-pay | Admitting: General Surgery

## 2017-01-14 VITALS — BP 124/80 | HR 96 | Temp 98.4°F | Ht 69.0 in | Wt 195.4 lb

## 2017-01-14 DIAGNOSIS — Z4889 Encounter for other specified surgical aftercare: Secondary | ICD-10-CM

## 2017-01-14 MED ORDER — TRAMADOL HCL 50 MG PO TABS: 50.0000 mg | ORAL_TABLET | Freq: Four times a day (QID) | ORAL | 0 refills | Status: DC | PRN

## 2017-01-14 NOTE — Patient Instructions (Signed)
We will see you back in office as scheduled below:  If you have any drainage, redness, fever/chills, or increased pain please give our office a call.

## 2017-01-14 NOTE — Progress Notes (Signed)
Outpatient Surgical Follow Up  01/14/2017  Pam Snyder is an 51 y.o. female.   Chief Complaint  Patient presents with  . Routine Post Op    post op: Laparotomy with Lysis of adhesions Repair of Umbilical hernia  7/85/88 Dr. Dahlia Byes    HPI: 51 year old female returns to clinic 10 days status post export her laparotomy with lysis of adhesions. [place] she is continuing to have abdominal pain but the pain medication she was prescribed at discharge was making her too dizzy and she stopped taking it. She is eating well and having bowel function. She denies any fevers, chills, chest pain, shortness of breath, diarrhea, constipation.  Past Medical History:  Diagnosis Date  . Abdominal pain   . Anemia   . Arthritis   . 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 Amplatzer closure in 2006  . Chest wall pain 01/28/2016  . DDD (degenerative disc disease), cervical 01/12/2017  . 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 Internal hernia   . Mitral valve prolapse   . Ovarian cyst   . Sickle cell trait ([place])   . Sigmoid volvulus ([place]) 01/04/2017  . Stroke [facility])     Past Surgical History:  Procedure Laterality Date  . CARDIAC CATHETERIZATION     x3 last in 2014  . ENDOMETRIAL ABLATION  2014   [facility]  . EPIGASTRIC HERNIA REPAIR  01/04/2017   Procedure: HERNIA REPAIR;  Surgeon: Jules Husbands, MD;  Location: [facility] ORS;  Service: General;;  . fibroid cyst    . LAPAROTOMY N/A 01/04/2017   Procedure: EXPLORATORY LAPAROTOMY;  Surgeon: Jules Husbands, MD;  Location: [facility] ORS;  Service: General;  Laterality: N/A;  . LYSIS OF ADHESION  01/04/2017   Procedure: LYSIS OF ADHESION;  Surgeon: Jules Husbands, MD;  Location: [facility] ORS;  Service: General;;    Family History  Problem Relation Age of Onset  . Lung cancer Mother   . Leukemia Father   . Sickle cell anemia Brother   . Colon cancer Maternal Grandmother   . Breast cancer Maternal Aunt   . Sickle cell anemia Daughter     Social History:   reports that she has never smoked. She has never used smokeless tobacco. She reports that she does not drink alcohol. Her drug history is not on file.  Allergies:  Allergies  Allergen Reactions  . No Known Allergies     Medications reviewed.    ROS A multipoint review of systems was completed, all pertinent positives and negatives are documented within the history of present illness and remainder are negative   BP 124/80   Pulse 96   Temp 98.4 F (36.9 C) (Oral)   Ht 5\' 9"  (1.753 m)   Wt 88.6 kg (195 lb 6.4 oz)   BMI 28.86 kg/m   Physical Exam Gen.: No acute distress chest: Clear to auscultation  heart: Regular rhythm Abdomen: Soft, nondistended, appropriately tender to palpation along the midline. Staples in place with hyperemia but no evidence of erythema or drainage.    No results found for this or any previous visit (from the past 48 hour(s)). No results found.  Assessment/Plan:  1. Aftercare following surgery 51 year old female status post export her laparotomy. Doing well. Staples removed today. Provided with a prescription for Ultram today. She'll follow-up in clinic in 1 week for additional wound check and follow-up.     Clayburn Pert, MD FACS General Surgeon  01/14/2017,2:31 PM

## 2017-01-15 ENCOUNTER — Telehealth: Payer: Self-pay

## 2017-01-15 NOTE — Telephone Encounter (Signed)
Patient's disability form was filled out. Please collect the $25 fee. Thank you.  The form will be left on your folder. Please fax once you have collected the fee.

## 2017-01-21 ENCOUNTER — Ambulatory Visit (INDEPENDENT_AMBULATORY_CARE_PROVIDER_SITE_OTHER): Payer: Self-pay | Admitting: Surgery

## 2017-01-21 ENCOUNTER — Encounter: Payer: Self-pay | Admitting: Surgery

## 2017-01-21 VITALS — BP 143/84 | HR 67 | Temp 98.0°F | Ht 69.0 in | Wt 196.6 lb

## 2017-01-21 DIAGNOSIS — Z09 Encounter for follow-up examination after completed treatment for conditions other than malignant neoplasm: Secondary | ICD-10-CM

## 2017-01-21 MED ORDER — GABAPENTIN 300 MG PO CAPS: 300.0000 mg | ORAL_CAPSULE | Freq: Three times a day (TID) | ORAL | 0 refills | Status: DC

## 2017-01-21 NOTE — Telephone Encounter (Signed)
Patient has paid her 25.00 fee.

## 2017-01-21 NOTE — Progress Notes (Signed)
S/p laparotomy and LOA Doing well Has some intermittent pain and constipation No emesis, no fevers  PE NAD Abd: soft, apropriate incisional tenderness, no peritonitis , wound healing well w/o infection  A/p Doing well Bowel regimen for constipation High fiber, miralax and increase water intake We will refer to GI for colonoscopy RTC 3 weeks gabapentin for adjuvant pain rx

## 2017-01-21 NOTE — Patient Instructions (Signed)
We will see you back in clinic in 3 weeks. You will most likely be able to return to work on 02/15/17. Please see your work notes given today.  We will send a referral to GI to have a Colonoscopy completed in 3 weeks.  Increase your fiber, increase your Miralax to 2 times daily, and increase your water intake to 72 ounces daily. You may obtain both of these medications at any drug store. Please read all information provided and if you have any questions prior to your next scheduled appointment, please call our office.   GENERAL POST-OPERATIVE PATIENT INSTRUCTIONS   WOUND CARE INSTRUCTIONS:  Keep a dry clean dressing on the wound if there is drainage. The initial bandage may be removed after 24 hours.  Once the wound has quit draining you may leave it open to air.  If clothing rubs against the wound or causes irritation and the wound is not draining you may cover it with a dry dressing during the daytime.  Try to keep the wound dry and avoid ointments on the wound unless directed to do so.  If the wound becomes bright red and painful or starts to drain infected material that is not clear, please contact your physician immediately.  If the wound is mildly pink and has a thick firm ridge underneath it, this is normal, and is referred to as a healing ridge.  This will resolve over the next 4-6 weeks.  BATHING: You may shower if you have been informed of this by your surgeon. However, Please do not submerge in a tub, hot tub, or pool until incisions are completely sealed or have been told by your surgeon that you may do so.  DIET:  You may eat any foods that you can tolerate.  It is a good idea to eat a high fiber diet and take in plenty of fluids to prevent constipation.  If you do become constipated you may want to take a mild laxative or take ducolax tablets on a daily basis until your bowel habits are regular.  Constipation can be very uncomfortable, along with straining, after recent  surgery.  ACTIVITY:  You are encouraged to cough and deep breath or use your incentive spirometer if you were given one, every 15-30 minutes when awake.  This will help prevent respiratory complications and low grade fevers post-operatively if you had a general anesthetic.  You may want to hug a pillow when coughing and sneezing to add additional support to the surgical area, if you had abdominal or chest surgery, which will decrease pain during these times.  You are encouraged to walk and engage in light activity for the next two weeks.  You should not lift more than 20 pounds, until 02/15/2017 as it could put you at increased risk for complications.  Twenty pounds is roughly equivalent to a plastic bag of groceries. At that time- Listen to your body when lifting, if you have pain when lifting, stop and then try again in a few days. Soreness after doing exercises or activities of daily living is normal as you get back in to your normal routine.  MEDICATIONS:  Try to take narcotic medications and anti-inflammatory medications, such as tylenol, ibuprofen, naprosyn, etc., with food.  This will minimize stomach upset from the medication.  Should you develop nausea and vomiting from the pain medication, or develop a rash, please discontinue the medication and contact your physician.  You should not drive, make important decisions, or operate machinery when  taking narcotic pain medication.  SUNBLOCK Use sun block to incision area over the next year if this area will be exposed to sun. This helps decrease scarring and will allow you avoid a permanent darkened area over your incision.  QUESTIONS:  Please feel free to call our office if you have any questions, and we will be glad to assist you. [phone]    For your constipation, you will want to start on Miralax 17 grams (1 capful) 1-2 times daily. This medication is available at any drug store and is over-the-counter. Your goal is to have a soft bowel  movement without straining and feel as though your bowels become emptied properly.  You will need to increase your water intake to approximately 72 ounces daily to allow this medication to work properly.  If you have any questions or concerns, please call our office.  Polyethylene Glycol powder What is this medicine? POLYETHYLENE GLYCOL 3350 (pol ee ETH i leen; GLYE col) powder is a laxative used to treat constipation. It increases the amount of water in the stool. Bowel movements become easier and more frequent. This medicine may be used for other purposes; ask your health care provider or pharmacist if you have questions. COMMON BRAND NAME(S): Sharlyn Bologna, GlycoLax, MiraLax, Smooth LAX, [employer] What should I tell my health care provider before I take this medicine? They need to know if you have any of these conditions: -a history of blockage of the stomach or intestine -current abdomen distension or pain -difficulty swallowing -diverticulitis, ulcerative colitis, or other chronic bowel disease -phenylketonuria -an unusual or allergic reaction to polyethylene glycol, other medicines, dyes, or preservatives -pregnant or trying to get pregnant -breast-feeding How should I use this medicine? Take this medicine by mouth. The bottle has a measuring cap that is marked with a line. Pour the powder into the cap up to the marked line (the dose is about 1 heaping tablespoon). Add the powder in the cap to a full glass (4 to 8 ounces or 120 to 240 ml) of water, juice, soda, coffee or tea. Mix the powder well. Drink the solution. Take exactly as directed. Do not take your medicine more often than directed. Talk to your pediatrician regarding the use of this medicine in children. Special care may be needed. Overdosage: If you think you have taken too much of this medicine contact a poison control center or emergency room at once. NOTE: This medicine is only for you. Do not share this medicine  with others. What if I miss a dose? If you miss a dose, take it as soon as you can. If it is almost time for your next dose, take only that dose. Do not take double or extra doses. What may interact with this medicine? Interactions are not expected. This list may not describe all possible interactions. Give your health care provider a list of all the medicines, herbs, non-prescription drugs, or dietary supplements you use. Also tell them if you smoke, drink alcohol, or use illegal drugs. Some items may interact with your medicine. What should I watch for while using this medicine? Do not use for more than 2 weeks without advice from your doctor or health care professional. It can take 2 to 4 days to have a bowel movement and to experience improvement in constipation. See your health care professional for any changes in bowel habits, including constipation, that are severe or last longer than three weeks. Always take this medicine with plenty of water. What  side effects may I notice from receiving this medicine? Side effects that you should report to your doctor or health care professional as soon as possible: -diarrhea -difficulty breathing -itching of the skin, hives, or skin rash -severe bloating, pain, or distension of the stomach -vomiting Side effects that usually do not require medical attention (report to your doctor or health care professional if they continue or are bothersome): -bloating or gas -lower abdominal discomfort or cramps -nausea This list may not describe all possible side effects. Call your doctor for medical advice about side effects. You may report side effects to FDA at 1-800-FDA-1088. Where should I keep my medicine? Keep out of the reach of children. Store between 15 and 30 degrees C (59 and 86 degrees F). Throw away any unused medicine after the expiration date. NOTE: This sheet is a summary. It may not cover all possible information. If you have questions about  this medicine, talk to your doctor, pharmacist, or health care provider.  2018 Elsevier/Gold Standard (2007-12-26 16:50:45)   Methylcellulose powder for suspension What is this medicine? METHYLCELLULOSE (meth ill SELL yoo lose) is a bulk-forming laxative. This medicine is used to treat constipation. This medicine may be used for other purposes; ask your health care provider or pharmacist if you have questions. COMMON BRAND NAME(S): Citrucel, Fiber Therapy, Benefiber What should I tell my health care provider before I take this medicine? They need to know if you have any of these conditions: -blockage of the intestines or bowel -change in bowel habits for more than 14 days -nausea or vomiting -phenylketonuria -stomach pain -trouble swallowing -an unusual or allergic reaction to methylcellulose, other medicines, foods, dyes, or preservatives -pregnant or trying or get pregnant -breast-feeding How should I use this medicine? Mix this medicine into a full glass of milk or water. Take by mouth. Follow the directions on the package labeling, or take as directed by your health care professional. Take your medicine at regular intervals. Do not take your medicine more often than directed. Talk to your pediatrician regarding the use of this medicine in children. While this drug may be prescribed for children as young as 40 years old for selected conditions, precautions do apply. Overdosage: If you think you have taken too much of this medicine contact a poison control center or emergency room at once. NOTE: This medicine is only for you. Do not share this medicine with others. What if I miss a dose? If you miss a dose, take it as soon as you can. If it is almost time for your next dose, take only that dose. Do not take double or extra doses. What may interact with this medicine? Interactions are not expected. This list may not describe all possible interactions. Give your health care provider a  list of all the medicines, herbs, non-prescription drugs, or dietary supplements you use. Also tell them if you smoke, drink alcohol, or use illegal drugs. Some items may interact with your medicine. What should I watch for while using this medicine? This medicine can take up to 3 days to work. Check with your doctor or health care professional if your symptoms do not start to get better or if they get worse. See your doctor if you have to treat your constipation for more than 1 week. Avoid taking other medicines within 2 hours of taking this medicine. Drink several glasses of water a day while you are taking this medicine. This will help to relieve constipation and prevent dehydration. What side  effects may I notice from receiving this medicine? Side effects that you should report to your doctor or health care professional as soon as possible: -allergic reactions like skin rash, itching or hives, swelling of the face, lips, or tongue -breathing problems -chest pain -nausea, vomiting -rectal bleeding -trouble swallowing Side effects that usually do not require medical attention (report to your doctor or health care professional if they continue or are bothersome): -diarrhea -headache -stomach cramps This list may not describe all possible side effects. Call your doctor for medical advice about side effects. You may report side effects to FDA at 1-800-FDA-1088. Where should I keep my medicine? Keep out of the reach of children. Store at room temperature between 15 and 30 degrees C (59 and 86 degrees F). Protect from moisture. Throw away any unused medicine after the expiration date. NOTE: This sheet is a summary. It may not cover all possible information. If you have questions about this medicine, talk to your doctor, pharmacist, or health care provider.  2018 Elsevier/Gold Standard (2007-12-14 14:06:46)

## 2017-01-22 NOTE — Telephone Encounter (Signed)
Called patient back and left her a voicemail letting her know that I needed a fax number for me to fax her disability form. Awaiting for her returned call and fax number.

## 2017-01-25 NOTE — Telephone Encounter (Signed)
Called patient again and left her a voicemail to call us back with a fax number to [place] to fax her disability form.

## 2017-01-26 NOTE — Telephone Encounter (Signed)
Called patient again and was not able to get in contact with the patient.   Patient's disability form is in the yellow folder waiting for a fax number.

## 2017-01-26 NOTE — Telephone Encounter (Signed)
Called patient again and left her a voicemail. Please fax her disability forms once patient calls [place] back with a fax number. Thank you.

## 2017-01-29 ENCOUNTER — Telehealth: Payer: Self-pay

## 2017-01-29 NOTE — Telephone Encounter (Signed)
Called patient to inform her that her Disability paperwork is at the office for pick-up or if she would prefer this to be faxed, she may let me know what the fax number is and I would be glad to fax it. No answer. Left voicemail stating this exact information. Paperwork will be kept in yellow disability folder until 02/04/17.

## 2017-02-01 NOTE — Telephone Encounter (Signed)
Left message for patient to call the office with a fax number to fax over.

## 2017-02-04 ENCOUNTER — Encounter: Payer: Self-pay | Admitting: Surgery

## 2017-02-11 ENCOUNTER — Telehealth: Payer: Self-pay

## 2017-02-11 ENCOUNTER — Ambulatory Visit: Payer: Medicaid Other | Admitting: Gastroenterology

## 2017-02-11 NOTE — Telephone Encounter (Signed)
Paperwork was left at the front desk and once patient faxes [place] the fax number that has been requested, then we could fax it.

## 2017-02-12 ENCOUNTER — Telehealth: Payer: Self-pay | Admitting: General Practice

## 2017-02-12 NOTE — Telephone Encounter (Signed)
Patients calling said she's tried everything, hasn't had a bowel movement in three days, she feels like she has to go and when she does she just sits for an hour and nothing happens,stomach is hurting, as if she is going to have a bowel movement, pain when she tries to go to the bathroom. Patient had surgery done on 01/04/17 with Dr. Dahlia Byes, she had a Laparotomy with Lysis of adhesions, Repair of Umbilical hernia Please call patient and advice.

## 2017-02-15 NOTE — Telephone Encounter (Signed)
Left message for patient to call office in regards to the message she left on 02/12/17.

## 2017-02-15 NOTE — Telephone Encounter (Signed)
Patient called and stated she had a small bowel movement over the weekend , how ever she still feels like she needs to go but nothing comes out.  She is taking Miralax twice daily and drinking 6 bottles a day.  She was instructed to try 2 fleets enemas and if she doesn't have good results to drink a bottle of Magnesium citrate. Be sure to drink at least 72 ounces of water.She was instructed to call office if no results.

## 2017-03-05 ENCOUNTER — Ambulatory Visit: Payer: Medicaid Other | Admitting: Gastroenterology

## 2017-03-17 ENCOUNTER — Encounter (INDEPENDENT_AMBULATORY_CARE_PROVIDER_SITE_OTHER): Payer: Self-pay

## 2017-03-17 ENCOUNTER — Encounter: Payer: Self-pay | Admitting: Gastroenterology

## 2017-03-17 ENCOUNTER — Other Ambulatory Visit: Payer: Self-pay

## 2017-03-17 ENCOUNTER — Ambulatory Visit (INDEPENDENT_AMBULATORY_CARE_PROVIDER_SITE_OTHER): Payer: Medicaid Other | Admitting: Gastroenterology

## 2017-03-17 VITALS — BP 147/74 | HR 72 | Temp 98.2°F | Ht 68.5 in | Wt 201.0 lb

## 2017-03-17 DIAGNOSIS — G8929 Other chronic pain: Secondary | ICD-10-CM | POA: Diagnosis not present

## 2017-03-17 DIAGNOSIS — R1031 Right lower quadrant pain: Secondary | ICD-10-CM | POA: Diagnosis not present

## 2017-03-17 MED ORDER — PEG 3350-KCL-NA BICARB-NACL 420 G PO SOLR: ORAL | 0 refills | Status: DC

## 2017-03-17 NOTE — Progress Notes (Signed)
Gastroenterology Consultation  Referring Provider:     Jules Husbands, MD Primary Care Physician:  Remi Haggard, FNP Primary Gastroenterologist:  Dr. Allen Norris     Reason for Consultation:     Lower abdominal pain        HPI:   Pam Snyder is a 51 y.o. y/o female referred for consultation & management of Lower abdominal pain by Dr. Lavena Bullion, Jordan Likes, FNP.  This patient comes in after having surgery for lysis of adhesions and repair of an internal hernia. The patient continues to have some pain in the right lower abdomen around her scar. The patient has been trying to lose some weight and has lost weight by cutting out sugary drinks. There is no report of any black stools or bloody stools. The patient states that she has had problems with constipation since she was a little girl and recalls being 51 years old and having problems moving her bowels. The patient has not had any recent change in bowel habits and states that she does not have any nausea vomiting. There is no report of any family history of colon cancer colon polyps. The pain around her incision and suprapelvic pain is described as a tingling and burning pain.  Past Medical History:  Diagnosis Date  . Abdominal pain   . Anemia   . Arthritis   . 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 Amplatzer closure in 2006  . Chest wall pain 01/28/2016  . DDD (degenerative disc disease), cervical 01/12/2017  . 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 Internal hernia   . Mitral valve prolapse   . Ovarian cyst   . Sickle cell trait ([place])   . 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 Stroke [facility])     Past Surgical History:  Procedure Laterality Date  . CARDIAC CATHETERIZATION     x3 last in 2014  . ENDOMETRIAL ABLATION  2014   [facility]  . EPIGASTRIC HERNIA REPAIR  01/04/2017   Procedure: HERNIA REPAIR;  Surgeon: Jules Husbands, MD;  Location: [facility] ORS;  Service: General;;  . fibroid cyst    . LAPAROTOMY N/A 01/04/2017   Procedure: EXPLORATORY LAPAROTOMY;  Surgeon: Jules Husbands, MD;  Location: [facility] ORS;  Service: General;  Laterality: N/A;  . LYSIS OF ADHESION  01/04/2017   Procedure: LYSIS OF ADHESION;  Surgeon: Jules Husbands, MD;  Location: [facility] ORS;  Service: General;;    Prior to Admission medications   Medication Sig Start Date End Date Taking? Authorizing Provider  albuterol (PROAIR HFA) 108 (90 Base) MCG/ACT inhaler Inhale 2 puffs into the lungs as needed.    [provider]  gabapentin (NEURONTIN) 300 MG capsule Take 1 capsule (300 mg total) by mouth 3 (three) times daily. Patient not taking: Reported on 03/17/2017 01/21/17   Pabon, Bea Graff F, MD  polyethylene glycol [facility] / GLYCOLAX) packet Take 17 g by mouth every other day.    [provider]  traMADol (ULTRAM) 50 MG tablet Take 1 tablet (50 mg total) by mouth every 6 (six) hours as needed. Patient not taking: Reported on 03/17/2017 01/14/17   Clayburn Pert, MD    Family History  Problem Relation Age of Onset  . Lung cancer Mother   . Leukemia Father   . Sickle cell anemia Brother   . Colon cancer Maternal Grandmother   . Breast cancer Maternal Aunt   . Sickle cell anemia Daughter      Social History  Substance Use Topics  . Smoking status: Never Smoker  . Smokeless tobacco: Never Used  . Alcohol use No    Allergies as of 03/17/2017 - Review Complete 03/17/2017  Allergen Reaction Noted  . No known allergies      Review of Systems:    All systems reviewed and negative except where noted in HPI.   Physical Exam:  BP (!) 147/74   Pulse 72   Temp 98.2 F (36.8 C) (Oral)   Ht 5' 8.5" (1.74 m)   Wt 201 lb (91.2 kg)   BMI 30.12 kg/m  No LMP recorded. Patient is not currently having periods (Reason: Irregular Periods). Psych:  Alert and cooperative. Normal mood and affect. General:   Alert,  Well-developed, well-nourished, pleasant and cooperative in NAD Head:  Normocephalic and atraumatic. Eyes:  Sclera  clear, no icterus.   Conjunctiva pink. Ears:  Normal auditory acuity. Nose:  No deformity, discharge, or lesions. Mouth:  No deformity or lesions,oropharynx pink & moist. Neck:  Supple; no masses or thyromegaly. Lungs:  Respirations even and unlabored.  Clear throughout to auscultation.   No wheezes, crackles, or rhonchi. No acute distress. Heart:  Regular rate and rhythm; no murmurs, clicks, rubs, or gallops. Abdomen:  Normal bowel sounds.  No bruits.  Soft, Positive tenderness to 1 finger palpation while flexing the abdominal wall muscles with raising the patient's leg 6 inches above the exam table and non-distended without masses, hepatosplenomegaly or hernias noted.  No guarding or rebound tenderness.  Negative Carnett sign.   Rectal:  Deferred.  Msk:  Symmetrical without gross deformities.  Good, equal movement & strength bilaterally. Pulses:  Normal pulses noted. Extremities:  No clubbing or edema.  No cyanosis. Neurologic:  Alert and oriented x3;  grossly normal neurologically. Skin:  Intact without significant lesions or rashes.  No jaundice. Lymph Nodes:  No significant cervical adenopathy. Psych:  Alert and cooperative. Normal mood and affect.  Imaging Studies: No results found.  Assessment and Plan:   [name] is a 51 y.o. y/o female lower abdominal pain due to muscle skeletal strain. The pain is reproducible with flexion of the abdominal wall muscles. The patient did have an internal hernia repair at the end of July with lysis of adhesions. The patient has been doing well but has been sent to me now for her abdominal pain and for colonoscopy. 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 The patient will be set up for a colonoscopy. I have discussed risks & benefits which include, but are not limited to, bleeding, infection, perforation & drug reaction.  The patient agrees with this plan & written consent will be obtained.     [name], MD.  Marval Regal   Note: This dictation was prepared with Dragon dictation along with smaller phrase technology. Any transcriptional errors that result from this process are unintentional.

## 2017-03-30 ENCOUNTER — Encounter: Payer: Self-pay | Admitting: Anesthesiology

## 2017-04-02 NOTE — Discharge Instructions (Signed)
General Anesthesia, Adult, Care After °These instructions provide you with information about caring for yourself after your procedure. Your health care provider may also give you more specific instructions. Your treatment has been planned according to current medical practices, but problems sometimes occur. Call your health care provider if you have any problems or questions after your procedure. °What can I expect after the procedure? °After the procedure, it is common to have: °· Vomiting. °· A sore throat. °· Mental slowness. ° °It is common to feel: °· Nauseous. °· Cold or shivery. °· Sleepy. °· Tired. °· Sore or achy, even in parts of your body where you did not have surgery. ° °Follow these instructions at home: °For at least 24 hours after the procedure: °· Do not: °? Participate in activities where you could fall or become injured. °? Drive. °? Use heavy machinery. °? Drink alcohol. °? Take sleeping pills or medicines that cause drowsiness. °? Make important decisions or sign legal documents. °? Take care of children on your own. °· Rest. °Eating and drinking °· If you vomit, drink water, juice, or soup when you can drink without vomiting. °· Drink enough fluid to keep your urine clear or pale yellow. °· Make sure you have little or no nausea before eating solid foods. °· Follow the diet recommended by your health care provider. °General instructions °· Have a responsible adult stay with you until you are awake and alert. °· Return to your normal activities as told by your health care provider. Ask your health care provider what activities are safe for you. °· Take over-the-counter and prescription medicines only as told by your health care provider. °· If you smoke, do not smoke without supervision. °· Keep all follow-up visits as told by your health care provider. This is important. °Contact a health care provider if: °· You continue to have nausea or vomiting at home, and medicines are not helpful. °· You  cannot drink fluids or start eating again. °· You cannot urinate after 8-12 hours. °· You develop a skin rash. °· You have fever. °· You have increasing redness at the site of your procedure. °Get help right away if: °· You have difficulty breathing. °· You have chest pain. °· You have unexpected bleeding. °· You feel that you are having a life-threatening or urgent problem. °This information is not intended to replace advice given to you by your health care provider. Make sure you discuss any questions you have with your health care provider. °Document Released: 08/31/2000 Document Revised: 10/28/2015 Document Reviewed: 05/09/2015 °Elsevier Interactive Patient Education © 2018 [employer] ° °

## 2017-04-05 ENCOUNTER — Ambulatory Visit: Admission: RE | Admit: 2017-04-05 | Payer: Medicaid Other | Source: Ambulatory Visit | Admitting: Gastroenterology

## 2017-04-05 HISTORY — DX: Motion sickness, initial encounter: T75.3XXA

## 2017-04-05 HISTORY — DX: Headache, unspecified: R51.9

## 2017-04-05 HISTORY — DX: Headache: R51

## 2017-04-05 SURGERY — COLONOSCOPY WITH PROPOFOL
Anesthesia: Choice

## 2017-07-01 IMAGING — CT CT HEAD W/O CM
3 series · 15 of 47 positions shown, 18 images · non-contrast
Comparison: None.

CLINICAL DATA: Pt with headache that has worsened over the last
week. Pt states she has also had some numbness to her lips today. Pt
with documented hx of stroke.

EXAM:
CT HEAD WITHOUT CONTRAST
TECHNIQUE: Contiguous axial images were obtained from the base of the skull
through the vertex without intravenous contrast.

[Series 2: head wo · axial · 0.42mm/px · z∈[-150,-25]mm · 9 of 30 slices shown, 12 images]
[im 3/30  brain]
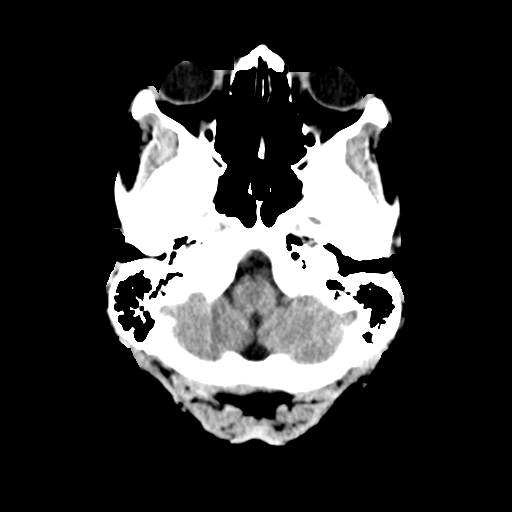
[im 3/30  bone]
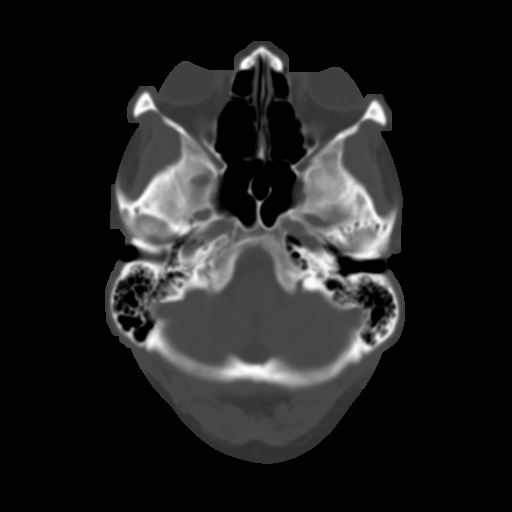
[im 6/30  brain]
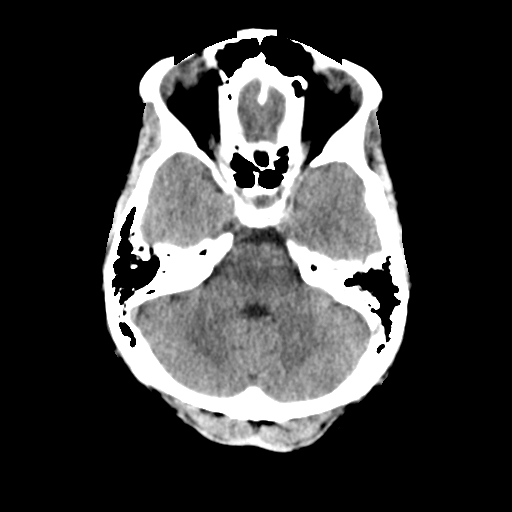
[im 9/30  brain]
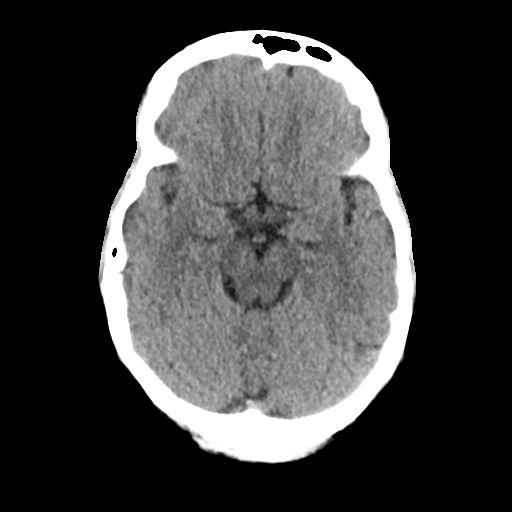
[im 12/30  brain]
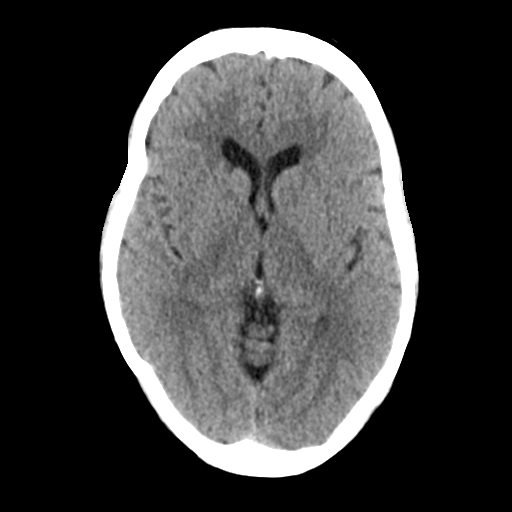
[im 16/30  brain]
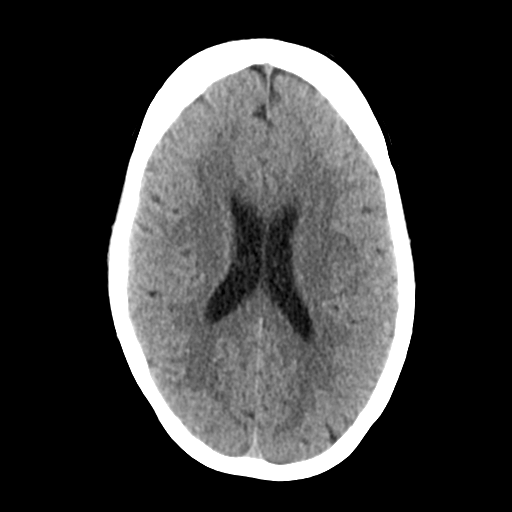
[im 16/30  bone]
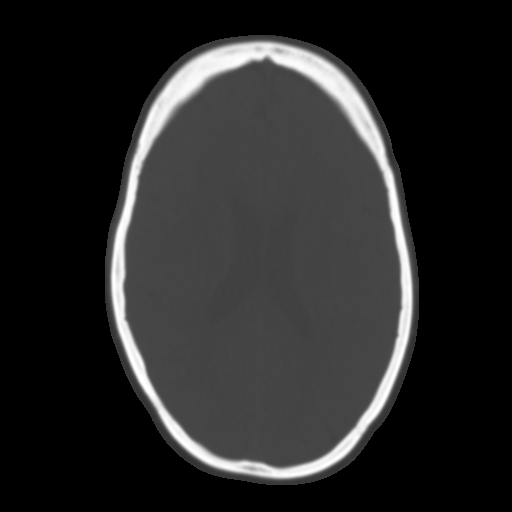
[im 19/30  brain]
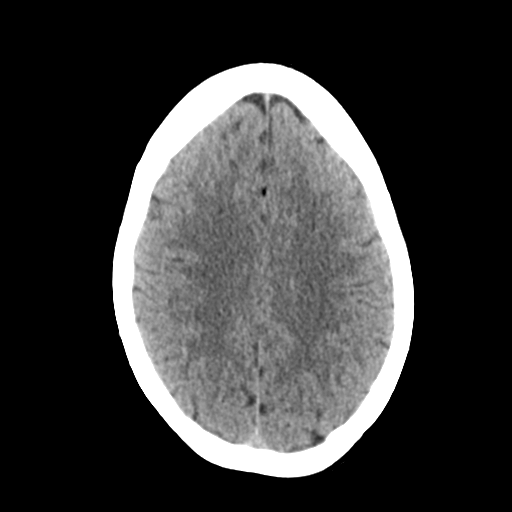
[im 22/30  brain]
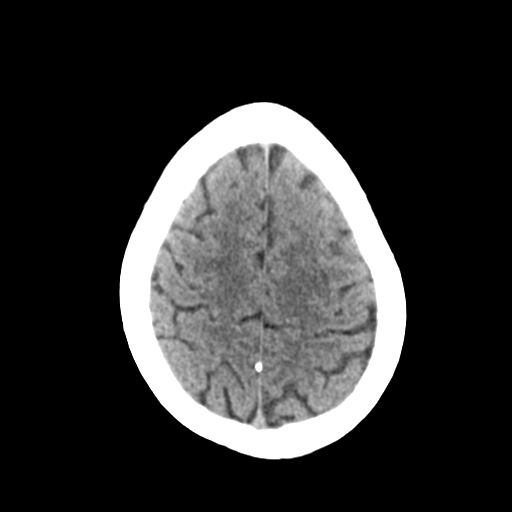
[im 25/30  brain]
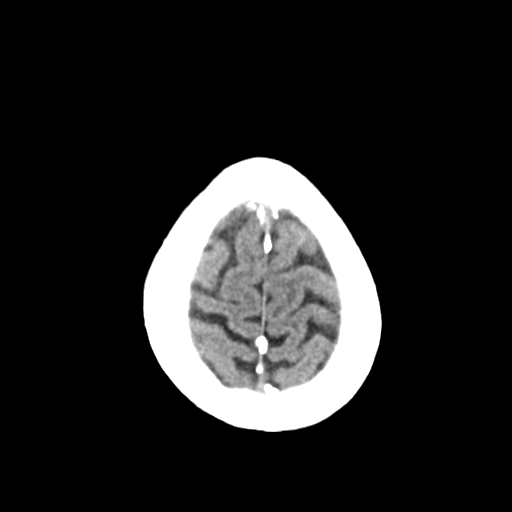
[im 28/30  brain]
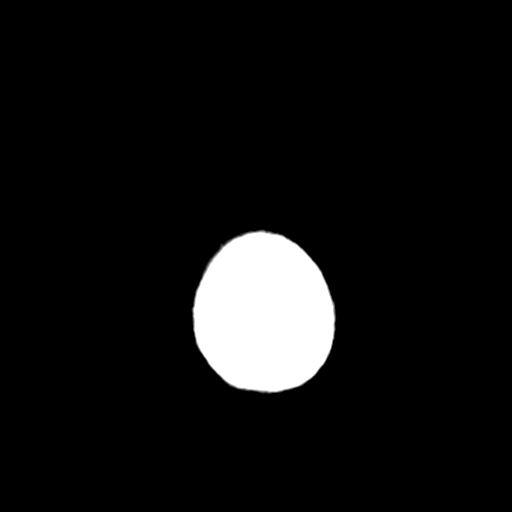
[im 28/30  bone]
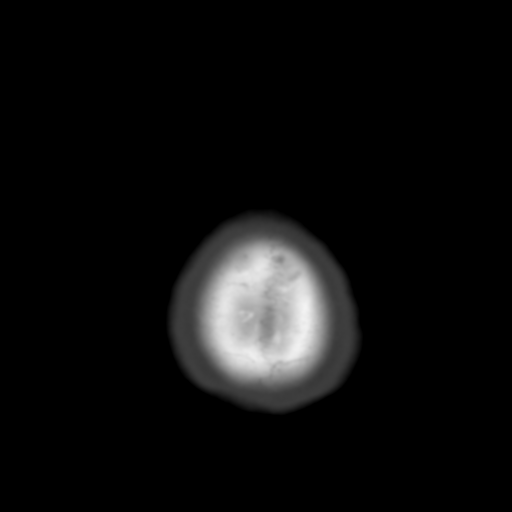

[Series 4: coronal soft tissue · coronal · 0.28mm/px · 3 of 69 slices shown]
[im 23/69  brain]
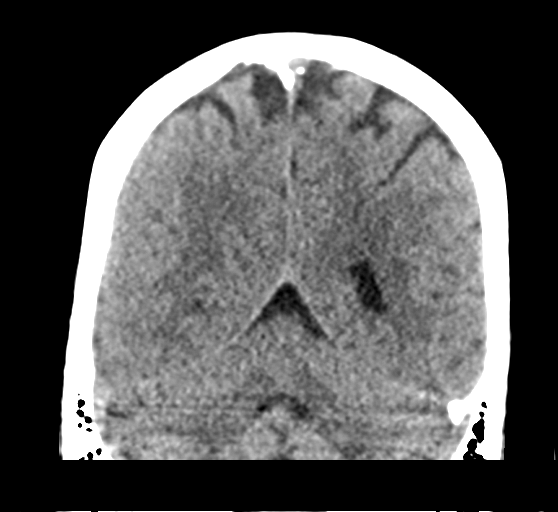
[im 31/69  brain]
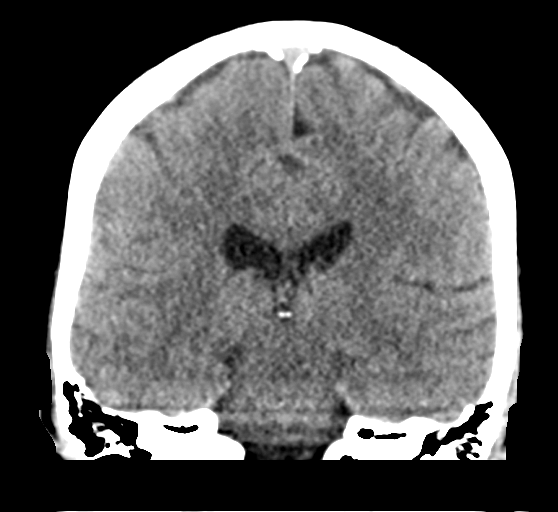
[im 38/69  brain]
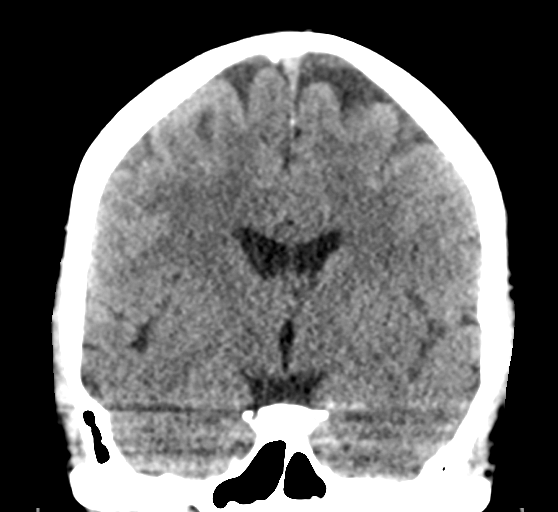

[Series 5: sagittal soft tissue · sagittal · 0.29mm/px · 3 of 50 slices shown]
[im 17/50  brain]
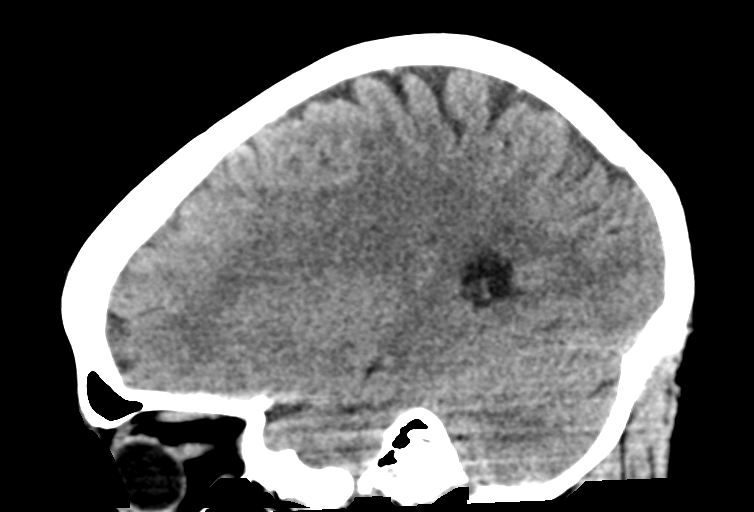
[im 25/50  brain]
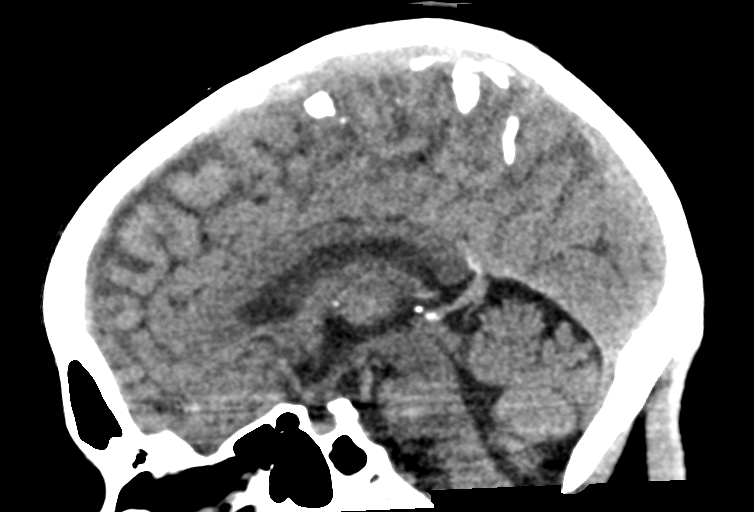
[im 33/50  brain]
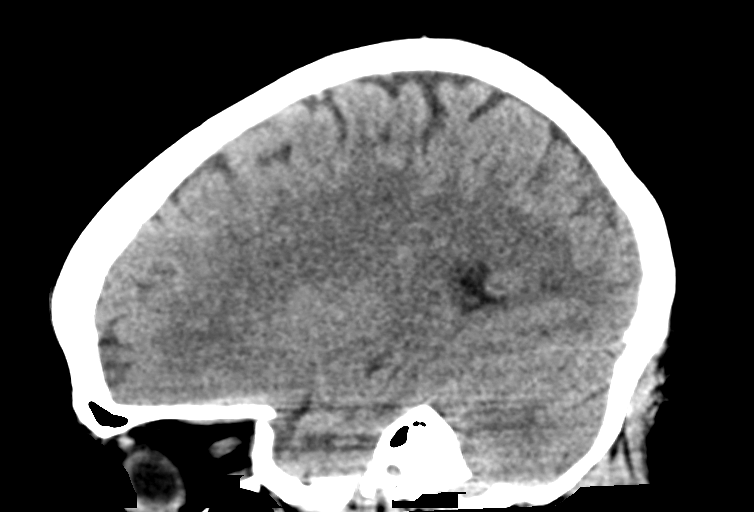

[15 of 47 positions shown; findings below may reference images not displayed]

FINDINGS: Brain: No evidence of acute infarction, hemorrhage, hydrocephalus,
extra-axial collection or mass lesion/mass effect.

There is a small old lacune infarct involving the right caudate
nucleus head.

Vascular: No hyperdense vessel or unexpected calcification.

Skull: Normal. Negative for fracture or focal lesion.

Sinuses/Orbits: Visualize globes and orbits are unremarkable.
Visualized sinuses and mastoid air cells are clear.

Other: None.
IMPRESSION: 1. No acute intracranial abnormalities.

## 2017-07-12 ENCOUNTER — Emergency Department: Payer: Medicaid Other

## 2017-07-12 ENCOUNTER — Emergency Department
Admission: EM | Admit: 2017-07-12 | Discharge: 2017-07-12 | Disposition: A | Discharge status: Home / Self Care | Payer: Medicaid Other | Attending: Emergency Medicine | Admitting: Emergency Medicine

## 2017-07-12 ENCOUNTER — Other Ambulatory Visit: Payer: Self-pay

## 2017-07-12 ENCOUNTER — Encounter: Payer: Self-pay | Admitting: Emergency Medicine

## 2017-07-12 DIAGNOSIS — R197 Diarrhea, unspecified: Secondary | ICD-10-CM

## 2017-07-12 DIAGNOSIS — X509XXA Other and unspecified overexertion or strenuous movements or postures, initial encounter: Secondary | ICD-10-CM | POA: Insufficient documentation

## 2017-07-12 DIAGNOSIS — T148XXA Other injury of unspecified body region, initial encounter: Secondary | ICD-10-CM

## 2017-07-12 DIAGNOSIS — S39012A Strain of muscle, fascia and tendon of lower back, initial encounter: Secondary | ICD-10-CM | POA: Insufficient documentation

## 2017-07-12 DIAGNOSIS — D573 Sickle-cell trait: Secondary | ICD-10-CM | POA: Insufficient documentation

## 2017-07-12 DIAGNOSIS — Y9389 Activity, other specified: Secondary | ICD-10-CM | POA: Insufficient documentation

## 2017-07-12 DIAGNOSIS — Y929 Unspecified place or not applicable: Secondary | ICD-10-CM | POA: Insufficient documentation

## 2017-07-12 DIAGNOSIS — Y998 Other external cause status: Secondary | ICD-10-CM | POA: Insufficient documentation

## 2017-07-12 DIAGNOSIS — Z79899 Other long term (current) drug therapy: Secondary | ICD-10-CM | POA: Insufficient documentation

## 2017-07-12 DIAGNOSIS — Z8673 Personal history of transient ischemic attack (TIA), and cerebral infarction without residual deficits: Secondary | ICD-10-CM | POA: Insufficient documentation

## 2017-07-12 LAB — BASIC METABOLIC PANEL
Anion gap: 6 (ref 5–15)
BUN: 11 mg/dL (ref 6–20)
CALCIUM: 8.6 mg/dL — AB (ref 8.9–10.3)
CO2: 24 mmol/L (ref 22–32)
CREATININE: 0.77 mg/dL (ref 0.44–1.00)
Chloride: 109 mmol/L (ref 101–111)
GFR calc Af Amer: >60 mL/min (ref >60)
GFR calc non Af Amer: >60 mL/min (ref >60)
GLUCOSE: 103 mg/dL — AB (ref 65–99)
Potassium: 3.8 mmol/L (ref 3.5–5.1)
Sodium: 139 mmol/L (ref 135–145)

## 2017-07-12 LAB — CBC
HCT: 36.5 % (ref 35.0–47.0)
HEMOGLOBIN: 11.7 g/dL — AB (ref 12.0–16.0)
MCH: 23.8 pg — AB (ref 26.0–34.0)
MCHC: 32.2 g/dL (ref 32.0–36.0)
MCV: 73.9 fL — ABNORMAL LOW (ref 80.0–100.0)
PLATELETS: 193 10*3/uL (ref 150–440)
RBC: 4.94 MIL/uL (ref 3.80–5.20)
RDW: 16.2 % — ABNORMAL HIGH (ref 11.5–14.5)
WBC: 4.3 10*3/uL (ref 3.6–11.0)

## 2017-07-12 LAB — TROPONIN I

## 2017-07-12 MED ORDER — IBUPROFEN 400 MG PO TABS
600.0000 mg | ORAL_TABLET | Freq: Once | ORAL | Status: AC
  Administered 2017-07-12: 600 mg via ORAL
  Filled 2017-07-12: qty 2

## 2017-07-12 MED ORDER — IBUPROFEN 600 MG PO TABS: 600.0000 mg | ORAL_TABLET | Freq: Three times a day (TID) | ORAL | 0 refills | Status: DC | PRN

## 2017-07-12 MED ORDER — CYCLOBENZAPRINE HCL 10 MG PO TABS
10.0000 mg | ORAL_TABLET | Freq: Once | ORAL | Status: AC
  Administered 2017-07-12: 10 mg via ORAL
  Filled 2017-07-12: qty 1

## 2017-07-12 MED ORDER — CYCLOBENZAPRINE HCL 10 MG PO TABS: 10.0000 mg | ORAL_TABLET | Freq: Three times a day (TID) | ORAL | 0 refills | Status: DC | PRN

## 2017-07-12 NOTE — ED Triage Notes (Signed)
Says had stomach viris this am--diarrhea.  Then started having nausea, and chest pain under left rib at 11am.

## 2017-07-12 NOTE — ED Provider Notes (Signed)
[facility] Emergency Department Provider Note  ____________________________________________   First MD Initiated Contact with Patient 07/12/17 1436     (approximate)  I have reviewed the triage vital signs and the nursing notes.   HISTORY  Chief Complaint Weakness and Chest Pain   HPI Pam Snyder is a 52 y.o. female self presents the emergency department with left low back and left lower chest pain that began suddenly when having forceful diarrhea this morning.  Patient reports cramping abdominal discomfort had sudden onset.  She then had multiple episodes of forceful diarrhea.  Some nausea but no vomiting.  The pain began immediately.  It seems to be worse with movement and deep inspiration.  Somewhat improved with rest.  She denies numbness or weakness.  The symptoms are nonexertional.  Past Medical History:  Diagnosis Date  . Abdominal pain   . Anemia   . Arthritis    hips, legs, arms  . 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 Amplatzer closure in 2006  . Car sickness   . Chest wall pain 01/28/2016  . DDD (degenerative disc disease), cervical 01/12/2017  . 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 Headache    Migraines  . Internal hernia   . Mitral valve prolapse   . Ovarian cyst   . Sickle cell trait ([place])   . 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 Stroke [facility])    15 years ago, no deficits    Patient Active Problem List   Diagnosis Date Noted  . DDD (degenerative disc disease), cervical 01/12/2017  . 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 Internal hernia   . Abdominal pain   . 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 Chest wall pain 01/28/2016    Past Surgical History:  Procedure Laterality Date  . CARDIAC CATHETERIZATION     x3 last in 2014, device implanted to help MVP  . ENDOMETRIAL ABLATION  2014   [facility]  . EPIGASTRIC HERNIA REPAIR  01/04/2017   Procedure: HERNIA REPAIR;   Surgeon: Jules Husbands, MD;  Location: [facility] ORS;  Service: General;;  . fibroid cyst    . LAPAROTOMY N/A 01/04/2017   Procedure: EXPLORATORY LAPAROTOMY;  Surgeon: Jules Husbands, MD;  Location: [facility] ORS;  Service: General;  Laterality: N/A;  . LYSIS OF ADHESION  01/04/2017   Procedure: LYSIS OF ADHESION;  Surgeon: Jules Husbands, MD;  Location: [facility] ORS;  Service: General;;    Prior to Admission medications   Medication Sig Start Date End Date Taking? Authorizing Provider  albuterol (PROAIR HFA) 108 (90 Base) MCG/ACT inhaler Inhale 2 puffs into the lungs as needed.    [provider]  cyclobenzaprine (FLEXERIL) 10 MG tablet Take 1 tablet (10 mg total) by mouth 3 (three) times daily as needed for muscle spasms. 07/12/17   Darel Hong, MD  gabapentin (NEURONTIN) 300 MG capsule Take 1 capsule (300 mg total) by mouth 3 (three) times daily. Patient not taking: Reported on 03/17/2017 01/21/17   Caroleen Hamman F, MD  ibuprofen (ADVIL,MOTRIN) 600 MG tablet Take 1 tablet (600 mg total) by mouth every 8 (eight) hours as needed. 07/12/17   Darel Hong, MD  polyethylene glycol (MIRALAX / GLYCOLAX) packet Take 17 g by mouth every other day.    [provider]  polyethylene glycol-electrolytes (NULYTELY/GOLYTELY) 420 g solution Drink one 8 oz glass every 20 mins until stools are clear 03/17/17   [name], MD  traMADol (ULTRAM) 50 MG tablet Take 1 tablet (50 mg total) by mouth every 6 (six) hours as needed. Patient not taking: Reported on 03/17/2017 01/14/17   Clayburn Pert, MD    Allergies No known allergies  Family History  Problem Relation Age of Onset  . Lung cancer Mother   . Leukemia Father   . Sickle cell anemia Brother   . Colon cancer Maternal Grandmother   . Breast cancer Maternal Aunt   . Sickle cell anemia Daughter     Social History Social History   Tobacco Use  . Smoking status: Never Smoker  . Smokeless tobacco: Never Used  Substance Use Topics  .  Alcohol use: No  . Drug use: No    Review of Systems Constitutional: No fever/chills Eyes: No visual changes. ENT: No sore throat. Cardiovascular: Positive for chest pain. Respiratory: Denies shortness of breath. Gastrointestinal: No abdominal pain.  Positive for nausea, no vomiting.  Positive for diarrhea.  No constipation. Genitourinary: Negative for dysuria. Musculoskeletal: Positive for back pain. Skin: Negative for rash. Neurological: Negative for headaches, focal weakness or numbness.   ____________________________________________   PHYSICAL EXAM:  VITAL SIGNS: ED Triage Vitals  Enc Vitals Group     BP 07/12/17 1231 133/61     Pulse Rate 07/12/17 1231 75     Resp 07/12/17 1231 16     Temp 07/12/17 1231 98.8 F (37.1 C)     Temp Source 07/12/17 1231 Oral     SpO2 07/12/17 1231 99 %     Weight 07/12/17 1233 201 lb (91.2 kg)     Height 07/12/17 1233 5\' 8"  (1.727 m)     Head Circumference --      Peak Flow --      Pain Score 07/12/17 1232 9     Pain Loc --      Pain Edu? --      Excl. in [place]? --     Constitutional: Alert and oriented x4 appears somewhat uncomfortable speaks in full clear sentences Eyes: PERRL EOMI. Head: Atraumatic. Nose: No congestion/rhinnorhea. Mouth/Throat: No trismus Neck: No stridor.   Cardiovascular: Normal rate, regular rhythm. Grossly normal heart sounds.  Good peripheral circulation. Respiratory: Normal respiratory effort.  No retractions. Lungs CTAB and moving good air Gastrointestinal: Soft nondistended nontender no rebound or guarding no peritonitis no McBurney's tenderness negative Rovsing's no costovertebral tenderness Musculoskeletal: Somewhat tender left lumbar back paraspinal Neurologic:  Normal speech and language. No gross focal neurologic deficits are appreciated. Skin:  Skin is warm, dry and intact. No rash noted. Psychiatric: Mood and affect are normal. Speech and behavior are  normal.    ____________________________________________   DIFFERENTIAL includes but not limited to  Muscle strain, muscle spasm, kidney stone, pyelonephritis, pulmonary embolism ____________________________________________   LABS (all labs ordered are listed, but only abnormal results are displayed)  Labs Reviewed  BASIC METABOLIC PANEL - Abnormal; Notable for the following components:      Result Value   Glucose, Bld 103 (*)    Calcium 8.6 (*)    All other components within normal limits  CBC - Abnormal; Notable for the following components:   Hemoglobin 11.7 (*)    MCV 73.9 (*)    MCH 23.8 (*)    RDW 16.2 (*)    All other components within normal limits  TROPONIN I    Lab work reviewed by me with no evidence of acute disease __________________________________________  EKG  ED ECG REPORT I, Darel Hong, the attending  physician, personally viewed and interpreted this ECG.  Date: 07/12/2017 EKG Time:  Rate: 78 Rhythm: normal sinus rhythm QRS Axis: normal Intervals: normal ST/T Wave abnormalities: T wave inversion inferiorly and laterally consistent with previous EKG done 08/01/16 Narrative Interpretation: no evidence of acute ischemia  ____________________________________________  RADIOLOGY  Chest x-ray reviewed by me with no acute disease ____________________________________________   PROCEDURES  Procedure(s) performed: no  Procedures  Critical Care performed: no  Observation: no ____________________________________________   INITIAL IMPRESSION / ASSESSMENT AND PLAN / ED COURSE  Pertinent labs & imaging results that were available during my care of the patient were reviewed by me and considered in my medical decision making (see chart for details).       ----------------------------------------- 2:50 PM on 07/12/2017 -----------------------------------------  Fortunately the patient's symptoms are improved.  She is focally tender of her  left low back.  Her symptoms began after having forceful diarrhea.  He feels improved after muscle relaxants and nonsteroidals.  I will discharge home with a short course.  Strict return precautions have been given and the patient verbalizes understanding and agree with the plan. ____________________________________________   FINAL CLINICAL IMPRESSION(S) / ED DIAGNOSES  Final diagnoses:  Diarrhea, unspecified type  Muscle strain      NEW MEDICATIONS STARTED DURING THIS VISIT:  Discharge Medication List as of 07/12/2017  2:50 PM    START taking these medications   Details  cyclobenzaprine (FLEXERIL) 10 MG tablet Take 1 tablet (10 mg total) by mouth 3 (three) times daily as needed for muscle spasms., Starting Mon 07/12/2017, Print    ibuprofen (ADVIL,MOTRIN) 600 MG tablet Take 1 tablet (600 mg total) by mouth every 8 (eight) hours as needed., Starting Mon 07/12/2017, Print         Note:  This document was prepared using Dragon voice recognition software and may include unintentional dictation errors.     Darel Hong, MD 07/14/17 1229

## 2017-07-12 NOTE — Discharge Instructions (Signed)
Please take your pain medication as needed for severe symptoms and follow-up with your primary care physician as needed.  Return to the emergency department for any concerns.  It was a pleasure to take care of you today, and thank you for coming to our emergency department.  If you have any questions or concerns before leaving please ask the nurse to grab me and I'm more than happy to go through your aftercare instructions again.  If you were prescribed any opioid pain medication today such as Norco, Vicodin, Percocet, morphine, hydrocodone, or oxycodone please make sure you do not drive when you are taking this medication as it can alter your ability to drive safely.  If you have any concerns once you are home that you are not improving or are in fact getting worse before you can make it to your follow-up appointment, please do not hesitate to call 911 and come back for further evaluation.  Darel Hong, MD  Results for orders placed or performed during the hospital encounter of 70/78/67  Basic metabolic panel  Result Value Ref Range   Sodium 139 135 - 145 mmol/L   Potassium 3.8 3.5 - 5.1 mmol/L   Chloride 109 101 - 111 mmol/L   CO2 24 22 - 32 mmol/L   Glucose, Bld 103 (H) 65 - 99 mg/dL   BUN 11 6 - 20 mg/dL   Creatinine, Ser 0.77 0.44 - 1.00 mg/dL   Calcium 8.6 (L) 8.9 - 10.3 mg/dL   GFR calc non Af Amer >60 >60 mL/min   GFR calc Af Amer >60 >60 mL/min   Anion gap 6 5 - 15  CBC  Result Value Ref Range   WBC 4.3 3.6 - 11.0 K/uL   RBC 4.94 3.80 - 5.20 MIL/uL   Hemoglobin 11.7 (L) 12.0 - 16.0 g/dL   HCT 36.5 35.0 - 47.0 %   MCV 73.9 (L) 80.0 - 100.0 fL   MCH 23.8 (L) 26.0 - 34.0 pg   MCHC 32.2 32.0 - 36.0 g/dL   RDW 16.2 (H) 11.5 - 14.5 %   Platelets 193 150 - 440 K/uL  Troponin I  Result Value Ref Range   Troponin I <0.03 <0.03 ng/mL   Dg Chest 2 View  Result Date: 07/12/2017 CLINICAL DATA:  Diarrhea earlier this morning with subsequent onset of nausea and chest pain. EXAM:  CHEST  2 VIEW COMPARISON:  Chest x-ray of August 11, 2014. FINDINGS: The lungs are well-expanded and clear. A ASD closure device is visible on the lateral view. The heart and pulmonary vascularity are normal. The mediastinum is normal in width. There is no pleural effusion. The bony thorax is unremarkable. IMPRESSION: There is no active cardiopulmonary disease. Electronically Signed   By: David  Martinique M.D.   On: 07/12/2017 13:12

## 2017-07-12 NOTE — ED Notes (Signed)
T-98.6, P-84 Radial, R-20, BP - 136/84.  Patient complaining of left arm pain and mid axillary area, states pain is the same as when triaged.  Rates it a 9/10.  Alert and oriented.  Wearing surgical mask.

## 2017-08-03 ENCOUNTER — Emergency Department: Payer: Medicaid Other

## 2017-08-03 ENCOUNTER — Other Ambulatory Visit: Payer: Self-pay

## 2017-08-03 ENCOUNTER — Encounter: Payer: Self-pay | Admitting: Emergency Medicine

## 2017-08-03 ENCOUNTER — Emergency Department
Admission: EM | Admit: 2017-08-03 | Discharge: 2017-08-03 | Disposition: A | Discharge status: Home / Self Care | Payer: Medicaid Other | Attending: Emergency Medicine | Admitting: Emergency Medicine

## 2017-08-03 DIAGNOSIS — J45909 Unspecified asthma, uncomplicated: Secondary | ICD-10-CM | POA: Insufficient documentation

## 2017-08-03 DIAGNOSIS — R69 Illness, unspecified: Secondary | ICD-10-CM

## 2017-08-03 DIAGNOSIS — J111 Influenza due to unidentified influenza virus with other respiratory manifestations: Secondary | ICD-10-CM | POA: Insufficient documentation

## 2017-08-03 DIAGNOSIS — Z79899 Other long term (current) drug therapy: Secondary | ICD-10-CM | POA: Insufficient documentation

## 2017-08-03 MED ORDER — IPRATROPIUM-ALBUTEROL 0.5-2.5 (3) MG/3ML IN SOLN
3.0000 mL | Freq: Once | RESPIRATORY_TRACT | Status: AC
  Administered 2017-08-03: 3 mL via RESPIRATORY_TRACT
  Filled 2017-08-03: qty 3

## 2017-08-03 MED ORDER — HYDROCODONE-HOMATROPINE 5-1.5 MG/5ML PO SYRP: 5.0000 mL | ORAL_SOLUTION | Freq: Four times a day (QID) | ORAL | 0 refills | Status: DC | PRN

## 2017-08-03 MED ORDER — AZITHROMYCIN 250 MG PO TABS: ORAL_TABLET | ORAL | 0 refills | Status: DC

## 2017-08-03 MED ORDER — OSELTAMIVIR PHOSPHATE 75 MG PO CAPS
75.0000 mg | ORAL_CAPSULE | Freq: Two times a day (BID) | ORAL | 0 refills | Status: DC
Start: 2017-08-03 — End: 2018-04-03

## 2017-08-03 MED ORDER — METHYLPREDNISOLONE 4 MG PO TBPK: ORAL_TABLET | ORAL | 0 refills | Status: DC

## 2017-08-03 NOTE — Discharge Instructions (Signed)
Follow-up with your regular doctor if you are not better in 3 days.  Use medication as prescribed.  You have been given an antibiotic you will take 2 pills on the first day and then 1 pill a day for the next 4 days.  This medication continues to work in your system for another 5 days.  You have also been given a steroid pack this is decrease inflammation of bronchial tubes.  A prescription for Tamiflu was also been given.  This does not cure the flu but will help with his symptoms.  You have also been given Hycodan cough syrup.  This medication has a narcotic in it do not drive or use heavy machinery while taking this medication

## 2017-08-03 NOTE — ED Triage Notes (Signed)
Patient ambulatory to triage with steady gait, without difficulty or distress noted, mask in place; pt reports x 2wks having body aches and prod cough yellow sputum, fever today

## 2017-08-03 NOTE — ED Provider Notes (Signed)
[facility] Emergency Department Provider Note  ____________________________________________   First MD Initiated Contact with Patient 08/03/17 1959     (approximate)  I have reviewed the triage vital signs and the nursing notes.   HISTORY  Chief Complaint Cough    HPI Pam Snyder is a 52 y.o. female who presents to the emergency department complaining of a cough and congestion for over 2 weeks.  She states she gets bronchitis easily.  She has been using her albuterol inhaler at home without any relief.  For the last 2 days she started having some fever/chills.  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  She denies any vomiting or diarrhea.  She states she has had some nausea with the drainage.  She has had body aches for the entire 2 weeks.  She is been using over-the-counter cold medicines without any relief  Past Medical History:  Diagnosis Date  . Abdominal pain   . Anemia   . Arthritis    hips, legs, arms  . 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 Amplatzer closure in 2006  . Car sickness   . Chest wall pain 01/28/2016  . DDD (degenerative disc disease), cervical 01/12/2017  . 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 Headache    Migraines  . Internal hernia   . Mitral valve prolapse   . Ovarian cyst   . Sickle cell trait ([place])   . 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 Stroke [facility])    15 years ago, no deficits    Patient Active Problem List   Diagnosis Date Noted  . DDD (degenerative disc disease), cervical 01/12/2017  . 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 Internal hernia   . Abdominal pain   . 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 Chest wall pain 01/28/2016    Past Surgical History:  Procedure Laterality Date  . CARDIAC CATHETERIZATION     x3 last in 2014, device implanted to help MVP  . ENDOMETRIAL ABLATION  2014   [facility]  . EPIGASTRIC  HERNIA REPAIR  01/04/2017   Procedure: HERNIA REPAIR;  Surgeon: Jules Husbands, MD;  Location: [facility] ORS;  Service: General;;  . fibroid cyst    . LAPAROTOMY N/A 01/04/2017   Procedure: EXPLORATORY LAPAROTOMY;  Surgeon: Jules Husbands, MD;  Location: [facility] ORS;  Service: General;  Laterality: N/A;  . LYSIS OF ADHESION  01/04/2017   Procedure: LYSIS OF ADHESION;  Surgeon: Jules Husbands, MD;  Location: [facility] ORS;  Service: General;;    Prior to Admission medications   Medication Sig Start Date End Date Taking? Authorizing Provider  albuterol (PROAIR HFA) 108 (90 Base) MCG/ACT inhaler Inhale 2 puffs into the lungs as needed.    [provider]  azithromycin (ZITHROMAX Z-PAK) 250 MG tablet 2 pills today then 1 pill a day for 4 days 08/03/17   Caryn Section Linden Dolin, PA-C  cyclobenzaprine (FLEXERIL) 10 MG tablet Take 1 tablet (10 mg total) by mouth 3 (three) times daily as needed for muscle spasms. 07/12/17   Darel Hong, MD  gabapentin (NEURONTIN) 300 MG capsule Take 1 capsule (300 mg total) by mouth 3 (three) times daily. Patient not taking: Reported on 03/17/2017 01/21/17   Pabon, Bea Graff F, MD  HYDROcodone-homatropine [facility]) 5-1.5 MG/5ML syrup Take 5 mLs by mouth every 6 (six) hours as needed for cough. 08/03/17   Versie Starks,  PA-C  ibuprofen (ADVIL,MOTRIN) 600 MG tablet Take 1 tablet (600 mg total) by mouth every 8 (eight) hours as needed. 07/12/17   Darel Hong, MD  methylPREDNISolone (MEDROL DOSEPAK) 4 MG TBPK tablet Take 6 pills on day one then decrease by 1 pill each day 08/03/17   Versie Starks, PA-C  oseltamivir (TAMIFLU) 75 MG capsule Take 1 capsule (75 mg total) by mouth 2 (two) times daily. 08/03/17   Bethania Schlotzhauer, Linden Dolin, PA-C  polyethylene glycol (MIRALAX / Floria Raveling) packet Take 17 g by mouth every other day.    [provider]  polyethylene glycol-electrolytes (NULYTELY/GOLYTELY) 420 g solution Drink one 8 oz glass every 20 mins until stools are clear 03/17/17   [name],  MD  traMADol (ULTRAM) 50 MG tablet Take 1 tablet (50 mg total) by mouth every 6 (six) hours as needed. Patient not taking: Reported on 03/17/2017 01/14/17   Clayburn Pert, MD    Allergies No known allergies  Family History  Problem Relation Age of Onset  . Lung cancer Mother   . Leukemia Father   . Sickle cell anemia Brother   . Colon cancer Maternal Grandmother   . Breast cancer Maternal Aunt   . Sickle cell anemia Daughter     Social History Social History   Tobacco Use  . Smoking status: Never Smoker  . Smokeless tobacco: Never Used  Substance Use Topics  . Alcohol use: No  . Drug use: No    Review of Systems  Constitutional: Positive fever/chills,, body aches Eyes: No visual changes. ENT: Positive sore throat.  Positive sinus congestion Respiratory: Positive cough, wheezing Genitourinary: Negative for dysuria. Musculoskeletal: Negative for back pain. Skin: Negative for rash.    ____________________________________________   PHYSICAL EXAM:  VITAL SIGNS: ED Triage Vitals  Enc Vitals Group     BP --      Pulse --      Resp --      Temp --      Temp src --      SpO2 --      Weight 08/03/17 1938 190 lb (86.2 kg)     Height 08/03/17 1938 5\' 8"  (1.727 m)     Head Circumference --      Peak Flow --      Pain Score 08/03/17 1937 9     Pain Loc --      Pain Edu? --      Excl. in [place]? --     Constitutional: Alert and oriented. Well appearing and in no acute distress. Eyes: Conjunctivae are normal.  Head: Atraumatic. Nose: No active congestion/rhinnorhea. Mouth/Throat: Mucous membranes are moist.  Throat is normal Neck: Supple, no lymphadenopathy is noted Cardiovascular: Normal rate, regular rhythm.  Heart sounds are normal Respiratory: Normal respiratory effort.  No retractions, lungs are clear to auscultation, there does appear to be some decreased air movement in the lower lungs Abdomen: Soft, nontender GU: deferred Musculoskeletal: FROM all  extremities, warm and well perfused Neurologic:  Normal speech and language.  Skin:  Skin is warm, dry and intact. No rash noted. Psychiatric: Mood and affect are normal. Speech and behavior are normal.  ____________________________________________   LABS (all labs ordered are listed, but only abnormal results are displayed)  Labs Reviewed - No data to display ____________________________________________   ____________________________________________  RADIOLOGY  Chest x-ray is negative  ____________________________________________   PROCEDURES  Procedure(s) performed: DuoNeb  Procedures    ____________________________________________   INITIAL IMPRESSION / ASSESSMENT AND  PLAN / ED COURSE  Pertinent labs & imaging results that were available during my care of the patient were reviewed by me and considered in my medical decision making (see chart for details).  Patient is 52 year old female complaining of cough and congestion for 2 weeks.  The last 2 days she started to have some body aches, fever and chills.  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  On physical exam the patient appears fairly well.  She is afebrile at this time.  Her cough is dry and hacking with some decreased lung sounds in the lower lung fields  Chest x-ray is negative for pneumonia  DuoNeb was given  Patient has decreased cough, no wheezing is noted, she has increased air movement after the DuoNeb  Diagnosis is influenza-like symptoms and acute asthmatic bronchitis.  She was given a prescription for Z-Pak, steroid pack, Hycodan cough syrup, and Tamiflu if desired.  Patient was instructed on how to use medications.  States she understands.  She is to follow-up with her regular doctor if not better in 3-5 days.  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  If she is worsening she is to return department.  Patient states she understands will comply with instructions.   She was discharged in stable condition     As part of my medical decision making, I reviewed the following data within the [place] notes reviewed and incorporated, Old chart reviewed, Radiograph reviewed chest x-ray is negative, Notes from prior ED visits and [place] Controlled Substance Database  ____________________________________________   FINAL CLINICAL IMPRESSION(S) / ED DIAGNOSES  Final diagnoses:  Acute asthmatic bronchitis  Influenza-like illness      NEW MEDICATIONS STARTED DURING THIS VISIT:  Discharge Medication List as of 08/03/2017  8:44 PM    START taking these medications   Details  azithromycin (ZITHROMAX Z-PAK) 250 MG tablet 2 pills today then 1 pill a day for 4 days, Print    HYDROcodone-homatropine (HYCODAN) 5-1.5 MG/5ML syrup Take 5 mLs by mouth every 6 (six) hours as needed for cough., Starting Tue 08/03/2017, Print    methylPREDNISolone (MEDROL DOSEPAK) 4 MG TBPK tablet Take 6 pills on day one then decrease by 1 pill each day, Print    oseltamivir (TAMIFLU) 75 MG capsule Take 1 capsule (75 mg total) by mouth 2 (two) times daily., Starting Tue 08/03/2017, Print         Note:  This document was prepared using Dragon voice recognition software and may include unintentional dictation errors.    Versie Starks, PA-C 08/03/17 2346    Schuyler Amor, MD 08/09/17 [phone]

## 2018-04-03 ENCOUNTER — Emergency Department: Payer: Medicaid Other

## 2018-04-03 ENCOUNTER — Encounter: Payer: Self-pay | Admitting: Emergency Medicine

## 2018-04-03 ENCOUNTER — Observation Stay
Admission: EM | Admit: 2018-04-03 | Discharge: 2018-04-05 | Disposition: A | Discharge status: Home / Self Care | Payer: Medicaid Other | Attending: Internal Medicine | Admitting: Internal Medicine

## 2018-04-03 DIAGNOSIS — Z8673 Personal history of transient ischemic attack (TIA), and cerebral infarction without residual deficits: Secondary | ICD-10-CM | POA: Diagnosis not present

## 2018-04-03 DIAGNOSIS — J45909 Unspecified asthma, uncomplicated: Secondary | ICD-10-CM | POA: Insufficient documentation

## 2018-04-03 DIAGNOSIS — R079 Chest pain, unspecified: Secondary | ICD-10-CM

## 2018-04-03 DIAGNOSIS — I4510 Unspecified right bundle-branch block: Secondary | ICD-10-CM | POA: Diagnosis not present

## 2018-04-03 DIAGNOSIS — M5136 Other intervertebral disc degeneration, lumbar region: Secondary | ICD-10-CM | POA: Diagnosis not present

## 2018-04-03 DIAGNOSIS — Q211 Atrial septal defect: Secondary | ICD-10-CM | POA: Diagnosis not present

## 2018-04-03 DIAGNOSIS — I119 Hypertensive heart disease without heart failure: Secondary | ICD-10-CM | POA: Insufficient documentation

## 2018-04-03 DIAGNOSIS — D573 Sickle-cell trait: Secondary | ICD-10-CM | POA: Insufficient documentation

## 2018-04-03 DIAGNOSIS — M7989 Other specified soft tissue disorders: Secondary | ICD-10-CM | POA: Insufficient documentation

## 2018-04-03 DIAGNOSIS — G43909 Migraine, unspecified, not intractable, without status migrainosus: Secondary | ICD-10-CM | POA: Insufficient documentation

## 2018-04-03 DIAGNOSIS — E669 Obesity, unspecified: Secondary | ICD-10-CM | POA: Diagnosis not present

## 2018-04-03 DIAGNOSIS — R0789 Other chest pain: Principal | ICD-10-CM | POA: Insufficient documentation

## 2018-04-03 DIAGNOSIS — D561 Beta thalassemia: Secondary | ICD-10-CM | POA: Diagnosis not present

## 2018-04-03 DIAGNOSIS — Z6832 Body mass index (BMI) 32.0-32.9, adult: Secondary | ICD-10-CM | POA: Diagnosis not present

## 2018-04-03 DIAGNOSIS — M1990 Unspecified osteoarthritis, unspecified site: Secondary | ICD-10-CM | POA: Diagnosis not present

## 2018-04-03 DIAGNOSIS — I252 Old myocardial infarction: Secondary | ICD-10-CM | POA: Diagnosis not present

## 2018-04-03 DIAGNOSIS — I341 Nonrheumatic mitral (valve) prolapse: Secondary | ICD-10-CM | POA: Diagnosis not present

## 2018-04-03 HISTORY — DX: Plantar fascial fibromatosis: M72.2

## 2018-04-03 HISTORY — DX: Acute myocardial infarction, unspecified: I21.9

## 2018-04-03 LAB — BASIC METABOLIC PANEL
ANION GAP: 6 (ref 5–15)
BUN: 8 mg/dL (ref 6–20)
CALCIUM: 8.8 mg/dL — AB (ref 8.9–10.3)
CO2: 30 mmol/L (ref 22–32)
Chloride: 104 mmol/L (ref 98–111)
Creatinine, Ser: 1.07 mg/dL — ABNORMAL HIGH (ref 0.44–1.00)
GFR calc non Af Amer: 59 mL/min — ABNORMAL LOW (ref >60)
GLUCOSE: 112 mg/dL — AB (ref 70–99)
POTASSIUM: 3.7 mmol/L (ref 3.5–5.1)
Sodium: 140 mmol/L (ref 135–145)

## 2018-04-03 LAB — CBC
HCT: 38.5 % (ref 36.0–46.0)
HEMOGLOBIN: 11.9 g/dL — AB (ref 12.0–15.0)
MCH: 23.6 pg — ABNORMAL LOW (ref 26.0–34.0)
MCHC: 30.9 g/dL (ref 30.0–36.0)
MCV: 76.2 fL — ABNORMAL LOW (ref 80.0–100.0)
NRBC: 0 % (ref 0.0–0.2)
Platelets: 209 10*3/uL (ref 150–400)
RBC: 5.05 MIL/uL (ref 3.87–5.11)
RDW: 15.8 % — ABNORMAL HIGH (ref 11.5–15.5)
WBC: 5.9 10*3/uL (ref 4.0–10.5)

## 2018-04-03 LAB — FIBRIN DERIVATIVES D-DIMER (ARMC ONLY): Fibrin derivatives D-dimer (ARMC): 538.77 ng/mL (FEU) — ABNORMAL HIGH (ref 0.00–499.00)

## 2018-04-03 LAB — BRAIN NATRIURETIC PEPTIDE: B NATRIURETIC PEPTIDE 5: 48 pg/mL (ref 0.0–100.0)

## 2018-04-03 LAB — TROPONIN I: TROPONIN I: 0.09 ng/mL — AB (ref <0.03)

## 2018-04-03 MED ORDER — ASPIRIN 81 MG PO CHEW
324.0000 mg | CHEWABLE_TABLET | Freq: Once | ORAL | Status: AC
  Administered 2018-04-04: 324 mg via ORAL
  Filled 2018-04-03: qty 4

## 2018-04-03 NOTE — ED Triage Notes (Signed)
Patient with complaint of right side chest pain and pressure radiating to her right arm that started today. Patient also states that she has bilateral feet swelling times one week. Patient also with complaint of left leg cramping that started today.

## 2018-04-03 NOTE — ED Provider Notes (Signed)
[facility] Emergency Department Provider Note   ____________________________________________   First MD Initiated Contact with Patient 04/03/18 2218     (approximate)  I have reviewed the triage vital signs and the nursing notes.   HISTORY  Chief Complaint Chest Pain   HPI Pam Snyder is a 52 y.o. female who had chest pain that woke her up today at 2 AM.  Martin Majestic away and came back a couple times.  Felt tight with shortness of breath and some slight nausea.  Lasted about 15 minutes for the longest time.  There is no pain there now.  Pain was moderately severe.  Past Medical History:  Diagnosis Date  . Abdominal pain   . Anemia   . Arthritis    hips, legs, arms  . 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 Amplatzer closure in 2006  . Car sickness   . Chest wall pain 01/28/2016  . DDD (degenerative disc disease), cervical 01/12/2017  . 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 Headache    Migraines  . Internal hernia   . MI (myocardial infarction) ([place])   . Mitral valve prolapse   . Ovarian cyst   . Plantar fasciitis   . Sickle cell trait ([place])   . 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 Stroke [facility])    15 years ago, no deficits    Patient Active Problem List   Diagnosis Date Noted  . DDD (degenerative disc disease), cervical 01/12/2017  . 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 Internal hernia   . Abdominal pain   . 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 Chest wall pain 01/28/2016    Past Surgical History:  Procedure Laterality Date  . CARDIAC CATHETERIZATION     x3 last in 2014, device implanted to help MVP  . ENDOMETRIAL ABLATION  2014   [facility]  . EPIGASTRIC HERNIA REPAIR  01/04/2017   Procedure: HERNIA REPAIR;  Surgeon: Jules Husbands, MD;  Location: [facility] ORS;  Service: General;;  . fibroid cyst    . LAPAROTOMY N/A 01/04/2017   Procedure: EXPLORATORY LAPAROTOMY;  Surgeon: Jules Husbands, MD;  Location: [facility] ORS;  Service: General;  Laterality: N/A;  . LYSIS OF ADHESION  01/04/2017   Procedure: LYSIS OF ADHESION;  Surgeon: Jules Husbands, MD;  Location: [facility] ORS;  Service: General;;    Prior to Admission medications   Medication Sig Start Date End Date Taking? Authorizing Provider  albuterol (PROAIR HFA) 108 (90 Base) MCG/ACT inhaler Inhale 2 puffs into the lungs as needed.    [provider]  polyethylene glycol (MIRALAX / GLYCOLAX) packet Take 17 g by mouth every other day.    [provider]  polyethylene glycol-electrolytes (NULYTELY/GOLYTELY) 420 g solution Drink one 8 oz glass every 20 mins until stools are clear Patient not taking: Reported on 04/03/2018 03/17/17   [name], MD    Allergies No known allergies  Family History  Problem Relation Age of Onset  . Lung cancer Mother   . Leukemia Father   . Sickle cell anemia Brother   . Colon cancer Maternal Grandmother   . Breast cancer Maternal Aunt   . Sickle cell anemia Daughter     Social History Social History   Tobacco Use  . Smoking status: Never Smoker  . Smokeless tobacco: Never Used  Substance Use Topics  . Alcohol use: No  . Drug use: No    Review  of Systems  Constitutional: No fever/chills Eyes: No visual changes. ENT: No sore throat. Cardiovascular: Denies chest pain at present. Respiratory: Denies shortness of breath at present. Gastrointestinal: No abdominal pain.  No nausea, no vomiting.  No diarrhea.  No constipation. Genitourinary: Negative for dysuria. Musculoskeletal: Negative for back pain. Skin: Negative for rash. Neurological: Negative for headaches, focal weakness   ____________________________________________   PHYSICAL EXAM:  VITAL SIGNS: ED Triage Vitals  Enc Vitals Group     BP 04/03/18 2129 (!) 146/81     Pulse Rate 04/03/18 2129 70     Resp 04/03/18 2129 18     Temp 04/03/18 2129 98.4 F (36.9 C)     Temp Source 04/03/18 2129  Oral     SpO2 04/03/18 2129 99 %     Weight 04/03/18 2126 215 lb (97.5 kg)     Height 04/03/18 2126 5\' 9"  (1.753 m)     Head Circumference --      Peak Flow --      Pain Score 04/03/18 2125 10     Pain Loc --      Pain Edu? --      Excl. in [place]? --     Constitutional: Alert and oriented. Well appearing and in no acute distress. Eyes: Conjunctivae are normal.  Head: Atraumatic. Nose: No congestion/rhinnorhea. Mouth/Throat: Mucous membranes are moist.  Oropharynx non-erythematous. Neck: No stridor.  Cardiovascular: Normal rate, regular rhythm. Grossly normal heart sounds.  Good peripheral circulation. Respiratory: Normal respiratory effort.  No retractions. Lungs CTAB. Gastrointestinal: Soft and nontender. No distention. No abdominal bruits. No CVA tenderness. Musculoskeletal: No lower extremity tenderness trace edema down the ankles bilaterally Neurologic:  Normal speech and language. No gross focal neurologic deficits are appreciated. \ Skin:  Skin is warm, dry and intact. No rash noted. Psychiatric: Mood and affect are normal. Speech and behavior are normal.  ____________________________________________   LABS (all labs ordered are listed, but only abnormal results are displayed)  Labs Reviewed  BASIC METABOLIC PANEL - Abnormal; Notable for the following components:      Result Value   Glucose, Bld 112 (*)    Creatinine, Ser 1.07 (*)    Calcium 8.8 (*)    GFR calc non Af Amer 59 (*)    All other components within normal limits  CBC - Abnormal; Notable for the following components:   Hemoglobin 11.9 (*)    MCV 76.2 (*)    MCH 23.6 (*)    RDW 15.8 (*)    All other components within normal limits  TROPONIN I - Abnormal; Notable for the following components:   Troponin I 0.09 (*)    All other components within normal limits  FIBRIN DERIVATIVES D-DIMER ([facility] ONLY) - Abnormal; Notable for the following components:   Fibrin derivatives D-dimer [facility]) 538.77 (*)    All other  components within normal limits  TROPONIN I - Abnormal; Notable for the following components:   Troponin I 0.09 (*)    All other components within normal limits  BRAIN NATRIURETIC PEPTIDE  PREGNANCY, URINE   ____________________________________________  EKG  EKG read and interpreted by me shows normal sinus rhythm rate of 71 left axis there are flipped T's in inferiorly and in V45 and 6 this is the same as an EKG from January 2018 and another one from July 2015. ____________________________________________  [place]  ED MD interpretation: X-ray read by radiology reviewed by me shows no acute disease  Official radiology report(s): Dg Chest 2  View  Result Date: 04/03/2018 CLINICAL DATA:  Right-sided chest pain and pressure. EXAM: CHEST - 2 VIEW COMPARISON:  08/03/2017 FINDINGS: The heart size and mediastinal contours are within normal limits. Both lungs are clear. The visualized skeletal structures are unremarkable. IMPRESSION: No active cardiopulmonary disease. Electronically Signed   By: Ashley Royalty M.D.   On: 04/03/2018 22:15   US Venous Img Lower Bilateral  Result Date: 04/04/2018 CLINICAL DATA:  52 year old female with bilateral leg swelling and pain. EXAM: BILATERAL LOWER EXTREMITY VENOUS DOPPLER ULTRASOUND TECHNIQUE: Gray-scale sonography with graded compression, as well as color Doppler and duplex ultrasound were performed to evaluate the lower extremity deep venous systems from the level of the common femoral vein and including the common femoral, femoral, profunda femoral, popliteal and calf veins including the posterior tibial, peroneal and gastrocnemius veins when visible. The superficial great saphenous vein was also interrogated. Spectral Doppler was utilized to evaluate flow at rest and with distal augmentation maneuvers in the common femoral, femoral and popliteal veins. COMPARISON:  None. FINDINGS: RIGHT LOWER EXTREMITY Common Femoral Vein: No evidence of thrombus. Normal  compressibility, respiratory phasicity and response to augmentation. Saphenofemoral Junction: No evidence of thrombus. Normal compressibility and flow on color Doppler imaging. Profunda Femoral Vein: No evidence of thrombus. Normal compressibility and flow on color Doppler imaging. Femoral Vein: No evidence of thrombus. Normal compressibility, respiratory phasicity and response to augmentation. Popliteal Vein: No evidence of thrombus. Normal compressibility, respiratory phasicity and response to augmentation. Calf Veins: No evidence of thrombus. Normal compressibility and flow on color Doppler imaging. Superficial Great Saphenous Vein: No evidence of thrombus. Normal compressibility. Venous Reflux:  None. Other Findings:  None. LEFT LOWER EXTREMITY Common Femoral Vein: No evidence of thrombus. Normal compressibility, respiratory phasicity and response to augmentation. Saphenofemoral Junction: No evidence of thrombus. Normal compressibility and flow on color Doppler imaging. Profunda Femoral Vein: No evidence of thrombus. Normal compressibility and flow on color Doppler imaging. Femoral Vein: No evidence of thrombus. Normal compressibility, respiratory phasicity and response to augmentation. Popliteal Vein: No evidence of thrombus. Normal compressibility, respiratory phasicity and response to augmentation. Calf Veins: No evidence of thrombus. Normal compressibility and flow on color Doppler imaging. Superficial Great Saphenous Vein: No evidence of thrombus. Normal compressibility. Venous Reflux:  None. Other Findings:  None. IMPRESSION: No evidence of deep venous thrombosis. Electronically Signed   By: Anner Crete M.D.   On: 04/04/2018 00:07    ____________________________________________   PROCEDURES  Procedure(s) performed:   Procedures  Critical Care performed:   ____________________________________________   INITIAL IMPRESSION / ASSESSMENT AND PLAN / ED COURSE  Patient has a history of  DVTs.  We will do an ultrasound as she also complained of some severe cramps in her right calf.  She also of course has the edema.  Her troponin is elevated and I anticipate admitting her.  D-dimer is mildly elevated and BNP is negative. ----------------------------------------- 12:53 AM on 04/04/2018 -----------------------------------------  Ultrasound is negative second troponin is still elevated all previous troponins are negative EKG is unchanged from 2015 however patient did not have any previous elevated troponins I will CT her chest just to make sure she did not have a PE and then plan on admitting her ruling her out.  She is not having any further chest pain.        ____________________________________________   FINAL CLINICAL IMPRESSION(S) / ED DIAGNOSES  Final diagnoses:  Chest pain, unspecified type     ED Discharge Orders    None  Note:  This document was prepared using Dragon voice recognition software and may include unintentional dictation errors.    Nena Polio, MD 04/04/18 [phone]

## 2018-04-03 NOTE — ED Notes (Signed)
Patient taken to ultrasound.

## 2018-04-03 NOTE — ED Notes (Signed)
Report given to Rebecca L RN 

## 2018-04-04 ENCOUNTER — Emergency Department: Payer: Medicaid Other

## 2018-04-04 ENCOUNTER — Encounter: Payer: Self-pay | Admitting: Radiology

## 2018-04-04 ENCOUNTER — Other Ambulatory Visit: Payer: Self-pay

## 2018-04-04 ENCOUNTER — Observation Stay
Admit: 2018-04-04 | Discharge: 2018-04-04 | Disposition: A | Discharge status: Home / Self Care | Payer: Medicaid Other | Attending: Internal Medicine | Admitting: Internal Medicine

## 2018-04-04 DIAGNOSIS — R079 Chest pain, unspecified: Secondary | ICD-10-CM | POA: Diagnosis present

## 2018-04-04 LAB — IRON AND TIBC
IRON: 48 ug/dL (ref 28–170)
Saturation Ratios: 14 % (ref 10.4–31.8)
TIBC: 334 ug/dL (ref 250–450)
UIBC: 286 ug/dL

## 2018-04-04 LAB — HEMOGLOBIN A1C
Hgb A1c MFr Bld: 6.5 % — ABNORMAL HIGH (ref 4.8–5.6)
MEAN PLASMA GLUCOSE: 139.85 mg/dL

## 2018-04-04 LAB — HEPATIC FUNCTION PANEL
ALBUMIN: 3 g/dL — AB (ref 3.5–5.0)
ALT: 20 U/L (ref 0–44)
AST: 22 U/L (ref 15–41)
Alkaline Phosphatase: 69 U/L (ref 38–126)
BILIRUBIN TOTAL: 0.3 mg/dL (ref 0.3–1.2)
Bilirubin, Direct: <0.1 mg/dL (ref 0.0–0.2)
TOTAL PROTEIN: 5.4 g/dL — AB (ref 6.5–8.1)

## 2018-04-04 LAB — URINALYSIS, ROUTINE W REFLEX MICROSCOPIC
Bilirubin Urine: NEGATIVE
Glucose, UA: NEGATIVE mg/dL
HGB URINE DIPSTICK: NEGATIVE
KETONES UR: NEGATIVE mg/dL
Leukocytes, UA: NEGATIVE
NITRITE: NEGATIVE
PH: 5 (ref 5.0–8.0)
PROTEIN: NEGATIVE mg/dL
Specific Gravity, Urine: >1.046 — ABNORMAL HIGH (ref 1.005–1.030)

## 2018-04-04 LAB — LIPID PANEL
Cholesterol: 181 mg/dL (ref 0–200)
HDL: 58 mg/dL (ref >40)
LDL CALC: 105 mg/dL — AB (ref 0–99)
Total CHOL/HDL Ratio: 3.1 RATIO
Triglycerides: 91 mg/dL (ref <150)
VLDL: 18 mg/dL (ref 0–40)

## 2018-04-04 LAB — RETICULOCYTES
Immature Retic Fract: 18.3 % — ABNORMAL HIGH (ref 2.3–15.9)
RBC.: 4.62 MIL/uL (ref 3.87–5.11)
Retic Count, Absolute: 72.5 10*3/uL (ref 19.0–186.0)
Retic Ct Pct: 1.6 % (ref 0.4–3.1)

## 2018-04-04 LAB — FERRITIN: Ferritin: 8 ng/mL — ABNORMAL LOW (ref 11–307)

## 2018-04-04 LAB — PREGNANCY, URINE: PREG TEST UR: NEGATIVE

## 2018-04-04 LAB — POCT PREGNANCY, URINE: PREG TEST UR: NEGATIVE

## 2018-04-04 LAB — TRANSFERRIN: Transferrin: 270 mg/dL (ref 192–382)

## 2018-04-04 LAB — PHOSPHORUS: Phosphorus: 3.9 mg/dL (ref 2.5–4.6)

## 2018-04-04 LAB — MAGNESIUM: Magnesium: 1.7 mg/dL (ref 1.7–2.4)

## 2018-04-04 LAB — TROPONIN I
TROPONIN I: 0.09 ng/mL — AB (ref <0.03)
Troponin I: 0.08 ng/mL (ref <0.03)

## 2018-04-04 MED ORDER — BISACODYL 5 MG PO TBEC
5.0000 mg | DELAYED_RELEASE_TABLET | Freq: Every day | ORAL | Status: DC | PRN
  Filled 2018-04-04: qty 1

## 2018-04-04 MED ORDER — SENNOSIDES-DOCUSATE SODIUM 8.6-50 MG PO TABS
1.0000 | ORAL_TABLET | Freq: Every evening | ORAL | Status: DC | PRN
  Filled 2018-04-04: qty 1

## 2018-04-04 MED ORDER — POTASSIUM CHLORIDE CRYS ER 20 MEQ PO TBCR
20.0000 meq | EXTENDED_RELEASE_TABLET | Freq: Every day | ORAL | Status: DC
  Administered 2018-04-04 – 2018-04-05 (×2): 20 meq via ORAL
  Filled 2018-04-04 (×2): qty 1

## 2018-04-04 MED ORDER — ALBUTEROL SULFATE (2.5 MG/3ML) 0.083% IN NEBU: 2.5000 mg | INHALATION_SOLUTION | Freq: Four times a day (QID) | RESPIRATORY_TRACT | Status: DC | PRN

## 2018-04-04 MED ORDER — ENOXAPARIN SODIUM 40 MG/0.4ML ~~LOC~~ SOLN
40.0000 mg | SUBCUTANEOUS | Status: DC
  Administered 2018-04-04 – 2018-04-05 (×2): 40 mg via SUBCUTANEOUS
  Filled 2018-04-04 (×2): qty 0.4

## 2018-04-04 MED ORDER — ACETAMINOPHEN 325 MG PO TABS
650.0000 mg | ORAL_TABLET | ORAL | Status: DC | PRN
  Administered 2018-04-04: 650 mg via ORAL
  Filled 2018-04-04: qty 2

## 2018-04-04 MED ORDER — NITROGLYCERIN 0.4 MG SL SUBL: 0.4000 mg | SUBLINGUAL_TABLET | SUBLINGUAL | Status: DC | PRN

## 2018-04-04 MED ORDER — ASPIRIN 81 MG PO CHEW
81.0000 mg | CHEWABLE_TABLET | Freq: Every day | ORAL | Status: DC
  Administered 2018-04-04 – 2018-04-05 (×2): 81 mg via ORAL
  Filled 2018-04-04 (×2): qty 1

## 2018-04-04 MED ORDER — CHLORTHALIDONE 25 MG PO TABS
25.0000 mg | ORAL_TABLET | Freq: Every day | ORAL | Status: DC
Start: 2018-04-04 — End: 2018-04-05
  Administered 2018-04-04 – 2018-04-05 (×2): 25 mg via ORAL
  Filled 2018-04-04 (×2): qty 1

## 2018-04-04 MED ORDER — ONDANSETRON HCL 4 MG/2ML IJ SOLN: 4.0000 mg | Freq: Four times a day (QID) | INTRAMUSCULAR | Status: DC | PRN

## 2018-04-04 MED ORDER — IOHEXOL 350 MG/ML SOLN
75.0000 mL | Freq: Once | INTRAVENOUS | Status: AC | PRN
  Administered 2018-04-04: 75 mL via INTRAVENOUS

## 2018-04-04 MED ORDER — MORPHINE SULFATE (PF) 2 MG/ML IV SOLN
2.0000 mg | INTRAVENOUS | Status: DC | PRN
  Administered 2018-04-04: 2 mg via INTRAVENOUS
  Filled 2018-04-04: qty 1

## 2018-04-04 NOTE — ED Notes (Signed)
Called dietary and spoke with Danae Chen regarding meal tray for pt. She stated they would send up.

## 2018-04-04 NOTE — ED Notes (Signed)
Pt educated about heart healthy diet. Dietary called for meal tray.

## 2018-04-04 NOTE — ED Notes (Signed)
Called report to [facility] and informed them of needing to come and get pt. Stated pt can go up in wheelchair.

## 2018-04-04 NOTE — ED Notes (Signed)
[facility] staff at bedside getting pt for transport to floor

## 2018-04-04 NOTE — ED Notes (Signed)
Pt is in CT at this time.

## 2018-04-04 NOTE — ED Notes (Signed)
Attempted to call report, after being on hold for 5 min, RN was informed that floor nurse was discharging a pt and will call me back in 5 min. ED charge nurse informed.

## 2018-04-04 NOTE — H&P (Signed)
[facility] [place] NAME: Pam Snyder    MR#:  [id number]  DATE OF BIRTH:  [birth year]  DATE OF ADMISSION:  04/03/2018  PRIMARY CARE PHYSICIAN: Remi Haggard, FNP   REQUESTING/REFERRING PHYSICIAN: Nena Polio, MD  CHIEF COMPLAINT:   Chief Complaint  Patient presents with  . Chest Pain    HISTORY OF PRESENT ILLNESS:  Pam Snyder  is a 52 y.o. 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 Pt states that she noticed B/L ankle edema on Monday 10/21. She states it persists even w/ recumbence. On Saturday (10/26) PM into Sunday (10/27) AM, pt states she was woken up from her sleep @~0300-0400AM w/ severe R-sided chest pain + R shoulder pain + R subscapular back pain. Pain was characterized as a sensation of pressure/heaviness, w/ associated SOB. Denies palpitations, diaphoresis, N/V, LH/LOC. She thought the pain may be musculoskeletal, and asked her daughter to massage it out, but this did not help. Pt decided to "tough it out", and states pain spontaneously improved after ~40min. She states it did not resolve completely, and was milder, but persistent. She states the character and severity of the pain were stable throughout the remainder of the day, into Monday (10/28), and are still unchanged as of the time of my assessment. The pain is non-radiating, non-reproducible, non-positional, non-pleuritic and non-exertional. Pt states that during the day on Sunday (10/27), she also experienced severe calf and thigh cramps. She ultimately decided to seek medical attention due to persistence of R chest/shoulder pain, as well as continued pain in her calves/thigh. EKG demonstrates TWI in II, III, aVF, V4-V6, and LVH, all of which are old findings. Trop-I 0.09 x2. Appears comfortable, and is not in  distress. States she had a (-) stress test ~21yrs ago.  PAST MEDICAL HISTORY:   Past Medical History:  Diagnosis Date  . Abdominal pain   . Anemia   . Arthritis    hips, legs, arms  . 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 Amplatzer closure in 2006  . Car sickness   . Chest wall pain 01/28/2016  . DDD (degenerative disc disease), cervical 01/12/2017  . 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 Headache    Migraines  . Internal hernia   . MI (myocardial infarction) ([place])   . Mitral valve prolapse   . Ovarian cyst   . Plantar fasciitis   . Sickle cell trait ([place])   . 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 Stroke ([place])    15 years ago, no deficits    PAST SURGICAL HISTORY:   Past Surgical History:  Procedure Laterality Date  . CARDIAC CATHETERIZATION     x3 last in 2014, device implanted to help MVP  . ENDOMETRIAL ABLATION  2014   [facility]  . EPIGASTRIC HERNIA REPAIR  01/04/2017   Procedure: HERNIA REPAIR;  Surgeon: Jules Husbands, MD;  Location: [facility] ORS;  Service: General;;  . fibroid cyst    . LAPAROTOMY N/A 01/04/2017   Procedure: EXPLORATORY LAPAROTOMY;  Surgeon: Jules Husbands, MD;  Location: [facility] ORS;  Service: General;  Laterality: N/A;  . LYSIS OF ADHESION  01/04/2017   Procedure: LYSIS OF ADHESION;  Surgeon: Jules Husbands, MD;  Location: [facility] ORS;  Service: General;;    SOCIAL HISTORY:   Social History   Tobacco  Use  . Smoking status: Never Smoker  . Smokeless tobacco: Never Used  Substance Use Topics  . Alcohol use: No    FAMILY HISTORY:   Family History  Problem Relation Age of Onset  . Lung cancer Mother   . Leukemia Father   . Sickle cell anemia Brother   . Colon cancer Maternal Grandmother   . Breast cancer Maternal Aunt   . Sickle cell anemia Daughter     DRUG ALLERGIES:   Allergies  Allergen Reactions  . No Known Allergies     REVIEW OF SYSTEMS:   Review of Systems  Constitutional: Negative for chills, diaphoresis, fever,  malaise/fatigue and weight loss.  HENT: Negative for congestion, ear pain, hearing loss, nosebleeds, sinus pain, sore throat and tinnitus.   Eyes: Negative for blurred vision, double vision and photophobia.  Respiratory: Positive for shortness of breath. Negative for cough, hemoptysis, sputum production and wheezing.   Cardiovascular: Positive for chest pain and leg swelling. Negative for palpitations, orthopnea, claudication and PND.  Gastrointestinal: Negative for abdominal pain, blood in stool, constipation, diarrhea, heartburn, melena, nausea and vomiting.  Genitourinary: Negative for dysuria, frequency, hematuria and urgency.  Musculoskeletal: Positive for back pain, joint pain and myalgias. Negative for falls and neck pain.  Skin: Negative for itching and rash.  Neurological: Negative for dizziness, tingling, tremors, sensory change, speech change, focal weakness, seizures, loss of consciousness, weakness and headaches.  Psychiatric/Behavioral: Negative for memory loss. The patient does not have insomnia.    MEDICATIONS AT HOME:   Prior to Admission medications   Medication Sig Start Date End Date Taking? Authorizing Provider  albuterol (PROAIR HFA) 108 (90 Base) MCG/ACT inhaler Inhale 2 puffs into the lungs as needed.    [provider]  polyethylene glycol (MIRALAX / GLYCOLAX) packet Take 17 g by mouth every other day.    [provider]  polyethylene glycol-electrolytes (NULYTELY/GOLYTELY) 420 g solution Drink one 8 oz glass every 20 mins until stools are clear Patient not taking: Reported on 04/03/2018 03/17/17   [name], MD      VITAL SIGNS:  Blood pressure (!) 144/75, pulse (!) 56, temperature 98.4 F (36.9 C), temperature source Oral, resp. rate 15, height 5\' 9"  (1.753 m), weight 97.5 kg, last menstrual period 12/02/2017, SpO2 100 %.  PHYSICAL EXAMINATION:  Physical Exam  Constitutional: She is oriented to person, place, and time. She appears  well-developed and well-nourished. She is active and cooperative.  Non-toxic appearance. She does not have a sickly appearance. She does not appear ill. No distress.  HENT:  Head: Normocephalic and atraumatic.  Mouth/Throat: Oropharynx is clear and moist. No oropharyngeal exudate.  Eyes: Conjunctivae, EOM and lids are normal. No scleral icterus.  Neck: Neck supple. No JVD present. No thyromegaly present.  Cardiovascular: Normal rate, regular rhythm and normal heart sounds.  No extrasystoles are present. Exam reveals no gallop, no S3, no S4, no distant heart sounds and no friction rub.  No murmur heard. Pulmonary/Chest: Effort normal and breath sounds normal. No accessory muscle usage or stridor. No tachypnea. No respiratory distress. She has no decreased breath sounds. She has no wheezes. She has no rhonchi. She has no rales.  Abdominal: Soft. Bowel sounds are normal. She exhibits no distension. There is no tenderness. There is no rebound and no guarding.  Musculoskeletal: Normal range of motion. She exhibits edema (Trace ankle edema.). She exhibits no tenderness.       Right lower leg: Normal. She exhibits edema (Trace ankle  edema.). She exhibits no tenderness.       Left lower leg: Normal. She exhibits edema (Trace ankle edema.). She exhibits no tenderness.  Lymphadenopathy:    She has no cervical adenopathy.  Neurological: She is alert and oriented to person, place, and time. She is not disoriented.  Skin: Skin is warm, dry and intact. No rash noted. She is not diaphoretic. No erythema. No pallor.  Psychiatric: She has a normal mood and affect. Her speech is normal and behavior is normal. Judgment and thought content normal. Her mood appears not anxious. She is not agitated. Cognition and memory are normal.   LABORATORY PANEL:   CBC Recent Labs  Lab 04/03/18 2140  WBC 5.9  HGB 11.9*  HCT 38.5  PLT 209    ------------------------------------------------------------------------------------------------------------------  Chemistries  Recent Labs  Lab 04/03/18 2140  NA 140  K 3.7  CL 104  CO2 30  GLUCOSE 112*  BUN 8  CREATININE 1.07*  CALCIUM 8.8*   ------------------------------------------------------------------------------------------------------------------  Cardiac Enzymes Recent Labs  Lab 04/04/18 0012  TROPONINI 0.09*   ------------------------------------------------------------------------------------------------------------------  RADIOLOGY:  Dg Chest 2 View  Result Date: 04/03/2018 CLINICAL DATA:  Right-sided chest pain and pressure. EXAM: CHEST - 2 VIEW COMPARISON:  08/03/2017 FINDINGS: The heart size and mediastinal contours are within normal limits. Both lungs are clear. The visualized skeletal structures are unremarkable. IMPRESSION: No active cardiopulmonary disease. Electronically Signed   By: Ashley Royalty M.D.   On: 04/03/2018 22:15   Ct Angio Chest Pe W And/or Wo Contrast  Result Date: 04/04/2018 CLINICAL DATA:  52 year old female with chest pain and shortness of breath. EXAM: CT ANGIOGRAPHY CHEST WITH CONTRAST TECHNIQUE: Multidetector CT imaging of the chest was performed using the standard protocol during bolus administration of intravenous contrast. Multiplanar CT image reconstructions and MIPs were obtained to evaluate the vascular anatomy. CONTRAST:  68mL OMNIPAQUE IOHEXOL 350 MG/ML SOLN COMPARISON:  Chest radiograph dated 04/03/2018 FINDINGS: Cardiovascular: There is mild cardiomegaly. Atrial septal Amplatzer occlusive device. No pericardial effusion. The thoracic aorta is unremarkable. 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 There is no CT evidence of pulmonary embolism. Mediastinum/Nodes: No hilar or mediastinal adenopathy. The esophagus and the thyroid gland are grossly unremarkable. No mediastinal fluid  collection. Lungs/Pleura: Lungs are clear. No pleural effusion or pneumothorax. Upper Abdomen: No acute abnormality. Musculoskeletal: Mild thoracic dextroscoliosis. No acute osseous pathology. Review of the MIP images confirms the above findings. IMPRESSION: No acute intrathoracic pathology. No CT evidence of pulmonary embolism. Electronically Signed   By: Anner Crete M.D.   On: 04/04/2018 01:46   US Venous Img Lower Bilateral  Result Date: 04/04/2018 CLINICAL DATA:  52 year old female with bilateral leg swelling and pain. EXAM: BILATERAL LOWER EXTREMITY VENOUS DOPPLER ULTRASOUND TECHNIQUE: Gray-scale sonography with graded compression, as well as color Doppler and duplex ultrasound were performed to evaluate the lower extremity deep venous systems from the level of the common femoral vein and including the common femoral, femoral, profunda femoral, popliteal and calf veins including the posterior tibial, peroneal and gastrocnemius veins when visible. The superficial great saphenous vein was also interrogated. Spectral Doppler was utilized to evaluate flow at rest and with distal augmentation maneuvers in the common femoral, femoral and popliteal veins. COMPARISON:  None. FINDINGS: RIGHT LOWER EXTREMITY Common Femoral Vein: No evidence of thrombus. Normal compressibility, respiratory phasicity and response to augmentation. Saphenofemoral Junction: No evidence of thrombus. Normal compressibility and flow on color Doppler imaging. Profunda Femoral Vein: No evidence  of thrombus. Normal compressibility and flow on color Doppler imaging. Femoral Vein: No evidence of thrombus. Normal compressibility, respiratory phasicity and response to augmentation. Popliteal Vein: No evidence of thrombus. Normal compressibility, respiratory phasicity and response to augmentation. Calf Veins: No evidence of thrombus. Normal compressibility and flow on color Doppler imaging. Superficial Great Saphenous Vein: No evidence of  thrombus. Normal compressibility. Venous Reflux:  None. Other Findings:  None. LEFT LOWER EXTREMITY Common Femoral Vein: No evidence of thrombus. Normal compressibility, respiratory phasicity and response to augmentation. Saphenofemoral Junction: No evidence of thrombus. Normal compressibility and flow on color Doppler imaging. Profunda Femoral Vein: No evidence of thrombus. Normal compressibility and flow on color Doppler imaging. Femoral Vein: No evidence of thrombus. Normal compressibility, respiratory phasicity and response to augmentation. Popliteal Vein: No evidence of thrombus. Normal compressibility, respiratory phasicity and response to augmentation. Calf Veins: No evidence of thrombus. Normal compressibility and flow on color Doppler imaging. Superficial Great Saphenous Vein: No evidence of thrombus. Normal compressibility. Venous Reflux:  None. Other Findings:  None. IMPRESSION: No evidence of deep venous thrombosis. Electronically Signed   By: Anner Crete M.D.   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 Trop-I 0.09 x2. Hyperglycemia, microcytic anemia, hypoalbuminemia/hypoproteinemia. -R-sided CP, calf/thigh cramps, ankle swelling, Troponin elevation: Pt p/w R-sided CP, atypical, non-exertional, as per HPI. She is a lifelong non-smoker. She is obese and has a Hx of HTN. She is not on any agents for management of hyperlipidemia. She does not carry a diagnosis of diabetes. She denies an immediate family Hx of CAD/MI in parents/siblings. Trop-I elevated but stable (0.09 x2), does not exhibit an uptrending trajectory. Tele, continuous cardiac monitoring. [id number]. Morphine, NTG, O2 PRN. Echo pending. Lipids, HbA1c for risk stratification. May benefit from stress test. Pt states last (-) stress test was ~9yrs ago. -Hyperglycemia: HbA1c pending (as  above). -Microcytic anemia: Hgb 11.9, MCV 76.2. Iron and saturation low normal but within normal range. Likely 2/2 beta thalassemia. -Hypoalbuminemia/hypoproteinemia: Prealbumin. U/A. -c/w home meds. -FEN/GI: Cardiac diet. -DVT PPx: Lovenox. -Code status: Full code. -Disposition: Observation, < 2 midnights.   All the records are reviewed and case discussed with ED provider. Management plans discussed with the patient, family and they are in agreement.  CODE STATUS: Full code.  TOTAL TIME TAKING CARE OF THIS PATIENT: 75 minutes.    Arta Silence M.D on 04/04/2018 at 3:57 AM  Between 7am to 6pm - Pager - [phone]  After 6pm go to [email or web address] - Proofreader  [facility]  Office  [phone]  CC: Primary care physician; Remi Haggard, FNP   Note: This dictation was prepared with Dragon dictation along with smaller phrase technology. Any transcriptional errors that result from this process are unintentional.

## 2018-04-04 NOTE — Progress Notes (Signed)
Admitted this morning for chest pain has history of hypertension, copd.  Right-sided chest pain is slightly better troponins likely due to atypical pain, continue aspirin, morphine, nitrates, cycle troponins, possible stress test in the morning. Time spent 5 minutes

## 2018-04-04 NOTE — ED Notes (Signed)
Pt given meal tray.

## 2018-04-05 ENCOUNTER — Observation Stay: Payer: Medicaid Other

## 2018-04-05 LAB — NM MYOCAR MULTI W/SPECT W/WALL MOTION / EF
CHL CUP NUCLEAR SSS: 4
CSEPPHR: 114 {beats}/min
LV sys vol: 74 mL
LVDIAVOL: 139 mL (ref 46–106)
Rest HR: 62 {beats}/min
SDS: 1
SRS: 8
TID: 1.02

## 2018-04-05 LAB — CALCIUM, IONIZED: CALCIUM, IONIZED, SERUM: 4.8 mg/dL (ref 4.5–5.6)

## 2018-04-05 LAB — ECHOCARDIOGRAM COMPLETE
Height: 68 in
WEIGHTICAEL: 3387.2 [oz_av]

## 2018-04-05 LAB — HIV ANTIBODY (ROUTINE TESTING W REFLEX): HIV Screen 4th Generation wRfx: NONREACTIVE

## 2018-04-05 MED ORDER — TECHNETIUM TC 99M TETROFOSMIN IV KIT
11.0400 | PACK | Freq: Once | INTRAVENOUS | Status: AC | PRN
  Administered 2018-04-05: 11.04 via INTRAVENOUS

## 2018-04-05 MED ORDER — TECHNETIUM TC 99M TETROFOSMIN IV KIT
32.5400 | PACK | Freq: Once | INTRAVENOUS | Status: AC | PRN
  Administered 2018-04-05: 32.54 via INTRAVENOUS

## 2018-04-05 MED ORDER — CHLORTHALIDONE 25 MG PO TABS: 25.0000 mg | ORAL_TABLET | Freq: Every day | ORAL | 0 refills | Status: DC

## 2018-04-05 MED ORDER — REGADENOSON 0.4 MG/5ML IV SOLN
0.4000 mg | Freq: Once | INTRAVENOUS | Status: AC
  Administered 2018-04-05: 0.4 mg via INTRAVENOUS

## 2018-04-05 NOTE — Plan of Care (Signed)
  Problem: Activity: Goal: Risk for activity intolerance will decrease Outcome: Progressing Note:  Up independently in room, tolerating well   Problem: Coping: Goal: Level of anxiety will decrease Outcome: Progressing   Problem: Pain Managment: Goal: General experience of comfort will improve Outcome: Progressing Note:  No complaints of pain this shift   Problem: Safety: Goal: Ability to remain free from injury will improve Outcome: Progressing   Problem: Skin Integrity: Goal: Risk for impaired skin integrity will decrease Outcome: Progressing   Problem: Education: Goal: Knowledge of General Education information will improve Description Including pain rating scale, medication(s)/side effects and non-pharmacologic comfort measures Outcome: Completed/Met   Problem: Nutrition: Goal: Adequate nutrition will be maintained Outcome: Completed/Met

## 2018-04-05 NOTE — Discharge Summary (Signed)
Pam Snyder, is a 52 y.o. female  DOB 18-Oct-1965  MRN [id number].  Admission date:  04/03/2018  Admitting Physician  Arta Silence, MD  Discharge Date:  04/05/2018   Primary MD  Remi Haggard, FNP  Recommendations for primary care physician for things to follow:    follow-up with PCP in 1 week  Admission Diagnosis  Chest pain, unspecified type [R07.9]   Discharge Diagnosis  Chest pain, unspecified type [R07.9]    Active Problems:   Chest pain      Past Medical History:  Diagnosis Date  . Abdominal pain   . Anemia   . Arthritis    hips, legs, arms  . 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 Amplatzer closure in 2006  . Car sickness   . Chest wall pain 01/28/2016  . DDD (degenerative disc disease), cervical 01/12/2017  . 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 Headache    Migraines  . Internal hernia   . MI (myocardial infarction) ([place])   . Mitral valve prolapse   . Ovarian cyst   . Plantar fasciitis   . Sickle cell trait ([place])   . 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 Stroke [facility])    15 years ago, no deficits    Past Surgical History:  Procedure Laterality Date  . CARDIAC CATHETERIZATION     x3 last in 2014, device implanted to help MVP  . ENDOMETRIAL ABLATION  2014   [facility]  . EPIGASTRIC HERNIA REPAIR  01/04/2017   Procedure: HERNIA REPAIR;  Surgeon: Jules Husbands, MD;  Location: [facility] ORS;  Service: General;;  . fibroid cyst    . LAPAROTOMY N/A 01/04/2017   Procedure: EXPLORATORY LAPAROTOMY;  Surgeon: Jules Husbands, MD;  Location: [facility] ORS;  Service: General;  Laterality: N/A;  . LYSIS OF ADHESION  01/04/2017   Procedure: LYSIS OF ADHESION;  Surgeon: Jules Husbands, MD;  Location: [facility] ORS;  Service: General;;       History of present illness and  Hospital Course:      Kindly see H&P for history of present illness and admission details, please review complete Labs, Consult reports and Test reports for all details in brief  HPI  from the history and physical done on the day of admission Patient admitted for chest pain on the right side.   Hospital Course  Right-sided chest pain, atypical chest pain.  Patient troponins have been negative, admitted to telemetry, patient received aspirin, nitroglycerin, Lexiscan stress test this morning showed EF 55 to 60% with low risk scan.  Patient denies any further chest pain, eager to go home.  2. .essential hypertension, continue  chlorthalidone.  Discharge Condition:stable   Follow UP  Follow-up Information    Remi Haggard, FNP. Schedule an appointment as soon as possible for a visit in 1 week(s).   Specialty:  Family Medicine Contact information: [address] [place] [address] [phone]             Discharge Instructions  and  Discharge Medications     Allergies as of 04/05/2018      Reactions   No Known Allergies       Medication List    STOP taking these medications   polyethylene glycol-electrolytes 420 g solution Commonly known as:  NuLYTELY/GoLYTELY     TAKE these medications   chlorthalidone 25 MG tablet Commonly known as:  HYGROTON Take 1 tablet (25 mg total) by mouth daily. Start taking  on:  04/06/2018   polyethylene glycol packet Commonly known as:  MIRALAX / GLYCOLAX Take 17 g by mouth every other day.   PROAIR HFA 108 (90 Base) MCG/ACT inhaler Generic drug:  albuterol Inhale 2 puffs into the lungs as needed.         Diet and Activity recommendation: See Discharge Instructions above   Consults obtained - none   Major procedures and Radiology Reports - PLEASE review detailed and final reports for all details, in brief -      Dg Chest 2 View  Result Date: 04/03/2018 CLINICAL DATA:  Right-sided chest pain and pressure. EXAM: CHEST - 2 VIEW  COMPARISON:  08/03/2017 FINDINGS: The heart size and mediastinal contours are within normal limits. Both lungs are clear. The visualized skeletal structures are unremarkable. IMPRESSION: No active cardiopulmonary disease. Electronically Signed   By: Ashley Royalty M.D.   On: 04/03/2018 22:15   Ct Angio Chest Pe W And/or Wo Contrast  Result Date: 04/04/2018 CLINICAL DATA:  52 year old female with chest pain and shortness of breath. EXAM: CT ANGIOGRAPHY CHEST WITH CONTRAST TECHNIQUE: Multidetector CT imaging of the chest was performed using the standard protocol during bolus administration of intravenous contrast. Multiplanar CT image reconstructions and MIPs were obtained to evaluate the vascular anatomy. CONTRAST:  23mL OMNIPAQUE IOHEXOL 350 MG/ML SOLN COMPARISON:  Chest radiograph dated 04/03/2018 FINDINGS: Cardiovascular: There is mild cardiomegaly. Atrial septal Amplatzer occlusive device. No pericardial effusion. The thoracic aorta is unremarkable. 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 There is no CT evidence of pulmonary embolism. Mediastinum/Nodes: No hilar or mediastinal adenopathy. The esophagus and the thyroid gland are grossly unremarkable. No mediastinal fluid collection. Lungs/Pleura: Lungs are clear. No pleural effusion or pneumothorax. Upper Abdomen: No acute abnormality. Musculoskeletal: Mild thoracic dextroscoliosis. No acute osseous pathology. Review of the MIP images confirms the above findings. IMPRESSION: No acute intrathoracic pathology. No CT evidence of pulmonary embolism. Electronically Signed   By: Anner Crete M.D.   On: 04/04/2018 01:46   Nm Myocar Multi W/spect W/wall Motion / Ef  Result Date: 04/05/2018  There was no ST segment deviation noted during stress.  T wave inversion was noted during stress in the II, III, V4, V5 and V6 leads.  The study is normal.  This is a low risk study.  The left ventricular ejection fraction is  normal (55-65%).  Negative lexiscan stress LV function normal No reversible ischemia Low risk study   US Venous Img Lower Bilateral  Result Date: 04/04/2018 CLINICAL DATA:  52 year old female with bilateral leg swelling and pain. EXAM: BILATERAL LOWER EXTREMITY VENOUS DOPPLER ULTRASOUND TECHNIQUE: Gray-scale sonography with graded compression, as well as color Doppler and duplex ultrasound were performed to evaluate the lower extremity deep venous systems from the level of the common femoral vein and including the common femoral, femoral, profunda femoral, popliteal and calf veins including the posterior tibial, peroneal and gastrocnemius veins when visible. The superficial great saphenous vein was also interrogated. Spectral Doppler was utilized to evaluate flow at rest and with distal augmentation maneuvers in the common femoral, femoral and popliteal veins. COMPARISON:  None. FINDINGS: RIGHT LOWER EXTREMITY Common Femoral Vein: No evidence of thrombus. Normal compressibility, respiratory phasicity and response to augmentation. Saphenofemoral Junction: No evidence of thrombus. Normal compressibility and flow on color Doppler imaging. Profunda Femoral Vein: No evidence of thrombus. Normal compressibility and flow on color Doppler imaging. Femoral Vein: No evidence of thrombus. Normal compressibility, respiratory phasicity and response  to augmentation. Popliteal Vein: No evidence of thrombus. Normal compressibility, respiratory phasicity and response to augmentation. Calf Veins: No evidence of thrombus. Normal compressibility and flow on color Doppler imaging. Superficial Great Saphenous Vein: No evidence of thrombus. Normal compressibility. Venous Reflux:  None. Other Findings:  None. LEFT LOWER EXTREMITY Common Femoral Vein: No evidence of thrombus. Normal compressibility, respiratory phasicity and response to augmentation. Saphenofemoral Junction: No evidence of thrombus. Normal compressibility and flow on  color Doppler imaging. Profunda Femoral Vein: No evidence of thrombus. Normal compressibility and flow on color Doppler imaging. Femoral Vein: No evidence of thrombus. Normal compressibility, respiratory phasicity and response to augmentation. Popliteal Vein: No evidence of thrombus. Normal compressibility, respiratory phasicity and response to augmentation. Calf Veins: No evidence of thrombus. Normal compressibility and flow on color Doppler imaging. Superficial Great Saphenous Vein: No evidence of thrombus. Normal compressibility. Venous Reflux:  None. Other Findings:  None. IMPRESSION: No evidence of deep venous thrombosis. Electronically Signed   By: Anner Crete M.D.   On: 04/04/2018 00:07    Micro Results    No results found for this or any previous visit (from the past 240 hour(s)).     Today   Subjective:   Glorianna Gott today no further chest pain, stable for discharge.  Objective:   Blood pressure 133/74, pulse 68, temperature 98.4 F (36.9 C), temperature source Oral, resp. rate 19, height 5\' 8"  (1.727 m), weight 96 kg, last menstrual period 12/02/2017, SpO2 100 %.   Intake/Output Summary (Last 24 hours) at 04/05/2018 1354 Last data filed at 04/05/2018 0505 Gross per 24 hour  Intake -  Output 1700 ml  Net -1700 ml    Exam Awake Alert, Oriented x 3, No new F.N deficits, Normal affect [place].AT,PERRAL Supple Neck,No JVD, No cervical lymphadenopathy appriciated.  Symmetrical Chest wall movement, Good air movement bilaterally, CTAB RRR,No Gallops,Rubs or new Murmurs, No Parasternal Heave +ve B.Sounds, Abd Soft, Non tender, No organomegaly appriciated, No rebound -guarding or rigidity. No Cyanosis, Clubbing or edema, No new Rash or bruise  Data Review   CBC w Diff:  Lab Results  Component Value Date   WBC 5.9 04/03/2018   HGB 11.9 (L) 04/03/2018   HGB 12.1 01/01/2014   HCT 38.5 04/03/2018   HCT 38.2 01/01/2014   PLT 209 04/03/2018   PLT 198 01/01/2014    LYMPHOPCT 10 01/05/2017   LYMPHOPCT 3.9 06/21/2013   MONOPCT 6 01/05/2017   MONOPCT 5.1 06/21/2013   EOSPCT 0 01/05/2017   EOSPCT 0.1 06/21/2013   BASOPCT 0 01/05/2017   BASOPCT 0.2 06/21/2013    CMP:  Lab Results  Component Value Date   NA 140 04/03/2018   NA 138 01/01/2014   K 3.7 04/03/2018   K 3.7 01/01/2014   CL 104 04/03/2018   CL 105 01/01/2014   CO2 30 04/03/2018   CO2 28 01/01/2014   BUN 8 04/03/2018   BUN 9 01/01/2014   CREATININE 1.07 (H) 04/03/2018   CREATININE 0.82 01/01/2014   PROT 5.4 (L) 04/04/2018   PROT 7.3 01/01/2014   ALBUMIN 3.0 (L) 04/04/2018   ALBUMIN 3.4 01/01/2014   BILITOT 0.3 04/04/2018   BILITOT 0.3 01/01/2014   ALKPHOS 69 04/04/2018   ALKPHOS 70 01/01/2014   AST 22 04/04/2018   AST 22 01/01/2014   ALT 20 04/04/2018   ALT 23 01/01/2014  .   Total Time in preparing paper work, data evaluation and todays exam - 17 minutes  Epifanio Lesches M.D on 04/05/2018  at 1:54 PM    Note: This dictation was prepared with Dragon dictation along with smaller phrase technology. Any transcriptional errors that result from this process are unintentional.

## 2018-04-05 NOTE — Progress Notes (Signed)
Admitted yesterday for chest pain on the right side, troponins have been negative x2, EKG unremarkable, patient denies any discomfort in the chest now.  Patient came because of right-sided chest heaviness.  Will do Lexiscan stress test this morning and if abnormal patient will be seen by cardiologist if normal patient will go home.  Discussed this with patient, she is agreeable for this plan.  Keep her n.p.o., Myoview ordered for this morning.

## 2018-04-05 NOTE — Progress Notes (Signed)
Patient discharged home today with daughter, IV access removed, patient given follow up information for appointments and prescription information provided. No complaints, VSS.

## 2018-04-05 NOTE — Plan of Care (Signed)
  Problem: Clinical Measurements: Goal: Ability to maintain clinical measurements within normal limits will improve Outcome: Progressing Goal: Diagnostic test results will improve Outcome: Progressing Goal: Cardiovascular complication will be avoided Outcome: Progressing   Problem: Activity: Goal: Risk for activity intolerance will decrease Outcome: Progressing   Problem: Pain Managment: Goal: General experience of comfort will improve Outcome: Progressing

## 2018-05-21 ENCOUNTER — Emergency Department: Payer: Medicaid Other

## 2018-05-21 ENCOUNTER — Emergency Department
Admission: EM | Admit: 2018-05-21 | Discharge: 2018-05-21 | Disposition: A | Discharge status: Home / Self Care | Payer: Medicaid Other | Attending: Emergency Medicine | Admitting: Emergency Medicine

## 2018-05-21 DIAGNOSIS — I252 Old myocardial infarction: Secondary | ICD-10-CM | POA: Insufficient documentation

## 2018-05-21 DIAGNOSIS — D573 Sickle-cell trait: Secondary | ICD-10-CM | POA: Diagnosis not present

## 2018-05-21 DIAGNOSIS — R202 Paresthesia of skin: Secondary | ICD-10-CM | POA: Diagnosis not present

## 2018-05-21 DIAGNOSIS — Z8673 Personal history of transient ischemic attack (TIA), and cerebral infarction without residual deficits: Secondary | ICD-10-CM | POA: Insufficient documentation

## 2018-05-21 DIAGNOSIS — Z79899 Other long term (current) drug therapy: Secondary | ICD-10-CM | POA: Diagnosis not present

## 2018-05-21 DIAGNOSIS — M25552 Pain in left hip: Secondary | ICD-10-CM | POA: Diagnosis not present

## 2018-05-21 DIAGNOSIS — R1032 Left lower quadrant pain: Secondary | ICD-10-CM | POA: Diagnosis present

## 2018-05-21 LAB — CBC
HCT: 37.2 % (ref 36.0–46.0)
Hemoglobin: 11.6 g/dL — ABNORMAL LOW (ref 12.0–15.0)
MCH: 23.5 pg — ABNORMAL LOW (ref 26.0–34.0)
MCHC: 31.2 g/dL (ref 30.0–36.0)
MCV: 75.3 fL — ABNORMAL LOW (ref 80.0–100.0)
NRBC: 0 % (ref 0.0–0.2)
PLATELETS: 210 10*3/uL (ref 150–400)
RBC: 4.94 MIL/uL (ref 3.87–5.11)
RDW: 15.3 % (ref 11.5–15.5)
WBC: 5.1 10*3/uL (ref 4.0–10.5)

## 2018-05-21 LAB — URINALYSIS, COMPLETE (UACMP) WITH MICROSCOPIC
BILIRUBIN URINE: NEGATIVE
Glucose, UA: NEGATIVE mg/dL
Hgb urine dipstick: NEGATIVE
KETONES UR: NEGATIVE mg/dL
LEUKOCYTES UA: NEGATIVE
Nitrite: NEGATIVE
Protein, ur: NEGATIVE mg/dL
SPECIFIC GRAVITY, URINE: 1.018 (ref 1.005–1.030)
pH: 5 (ref 5.0–8.0)

## 2018-05-21 LAB — BASIC METABOLIC PANEL
ANION GAP: 6 (ref 5–15)
BUN: 9 mg/dL (ref 6–20)
CALCIUM: 8.3 mg/dL — AB (ref 8.9–10.3)
CO2: 28 mmol/L (ref 22–32)
Chloride: 104 mmol/L (ref 98–111)
Creatinine, Ser: 0.83 mg/dL (ref 0.44–1.00)
GFR calc Af Amer: >60 mL/min (ref >60)
Glucose, Bld: 138 mg/dL — ABNORMAL HIGH (ref 70–99)
POTASSIUM: 3.2 mmol/L — AB (ref 3.5–5.1)
Sodium: 138 mmol/L (ref 135–145)

## 2018-05-21 LAB — PREGNANCY, URINE: Preg Test, Ur: NEGATIVE

## 2018-05-21 MED ORDER — DICLOFENAC SODIUM 1 % TD GEL: TRANSDERMAL | 0 refills | Status: DC

## 2018-05-21 MED ORDER — CYCLOBENZAPRINE HCL 5 MG PO TABS: 5.0000 mg | ORAL_TABLET | Freq: Three times a day (TID) | ORAL | 0 refills | Status: DC | PRN

## 2018-05-21 MED ORDER — OXYCODONE-ACETAMINOPHEN 5-325 MG PO TABS
1.0000 | ORAL_TABLET | Freq: Once | ORAL | Status: AC
  Administered 2018-05-21: 1 via ORAL
  Filled 2018-05-21: qty 1

## 2018-05-21 NOTE — ED Notes (Signed)
Pt leaving for imaging.

## 2018-05-21 NOTE — ED Triage Notes (Signed)
Pt presents via POV c/o left flank pain x3 days. Denies dysuria. Ambulatory to triage. Denies N/V/D.

## 2018-05-21 NOTE — ED Notes (Addendum)
Pt states she had bad HA yesterday and has had tingling/numbness in R hand and face ever since. Denies HA now. Denies pain on urination or any difference in ability to urinate/amount. Denies fever.

## 2018-05-21 NOTE — ED Provider Notes (Addendum)
[facility] Emergency Department Provider Note  ___________________________________________   First MD Initiated Contact with Patient 05/21/18 1732     (approximate)  I have reviewed the triage vital signs and the nursing notes.   HISTORY  Chief Complaint Flank Pain   HPI Pam Snyder is a 52 y.o. female with a history of MI, CVA as well as hip arthritis who was presented to the emergency department with 2 to 3 days of left flank and hip pain.  Says that the pain is worse with movement/walking.  States the pain is a 10 out of 10.  No injury reported.  States the pain radiates around from the back to the anterior of her left hip.  Denies any burning with urination.  Denies any vaginal discharge or bleeding.  Denies any blood in her urine.  Denies any nausea or vomiting.  Also complaining of a headache that she said was throbbing yesterday and present to the left frontal region.  States that she has a history of migraines but this headache was slightly different because she had numbness to her right hand today which is now resolved.   Past Medical History:  Diagnosis Date  . Abdominal pain   . Anemia   . Arthritis    hips, legs, arms  . 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 Amplatzer closure in 2006  . Car sickness   . Chest wall pain 01/28/2016  . DDD (degenerative disc disease), cervical 01/12/2017  . 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 Headache    Migraines  . Internal hernia   . MI (myocardial infarction) ([place])   . Mitral valve prolapse   . Ovarian cyst   . Plantar fasciitis   . Sickle cell trait ([place])   . 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 Stroke [facility])    15 years ago, no deficits    Patient Active Problem List   Diagnosis Date Noted  . Chest pain 04/04/2018  . DDD (degenerative disc disease), cervical 01/12/2017  . 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  Internal hernia   . Abdominal pain   . 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 Chest wall pain 01/28/2016    Past Surgical History:  Procedure Laterality Date  . CARDIAC CATHETERIZATION     x3 last in 2014, device implanted to help MVP  . ENDOMETRIAL ABLATION  2014   [facility]  . EPIGASTRIC HERNIA REPAIR  01/04/2017   Procedure: HERNIA REPAIR;  Surgeon: Jules Husbands, MD;  Location: [facility] ORS;  Service: General;;  . fibroid cyst    . LAPAROTOMY N/A 01/04/2017   Procedure: EXPLORATORY LAPAROTOMY;  Surgeon: Jules Husbands, MD;  Location: [facility] ORS;  Service: General;  Laterality: N/A;  . LYSIS OF ADHESION  01/04/2017   Procedure: LYSIS OF ADHESION;  Surgeon: Jules Husbands, MD;  Location: [facility] ORS;  Service: General;;    Prior to Admission medications   Medication Sig Start Date End Date Taking? Authorizing Provider  albuterol (PROAIR HFA) 108 (90 Base) MCG/ACT inhaler Inhale 2 puffs into the lungs as needed.    [provider]  chlorthalidone (HYGROTON) 25 MG tablet Take 1 tablet (25 mg total) by mouth daily. 04/06/18   Epifanio Lesches, MD  polyethylene glycol (MIRALAX / GLYCOLAX) packet Take 17 g by mouth every other day.    [provider]    Allergies No known allergies  Family History  Problem Relation  Age of Onset  . Lung cancer Mother   . Leukemia Father   . Sickle cell anemia Brother   . Colon cancer Maternal Grandmother   . Breast cancer Maternal Aunt   . Sickle cell anemia Daughter     Social History Social History   Tobacco Use  . Smoking status: Never Smoker  . Smokeless tobacco: Never Used  Substance Use Topics  . Alcohol use: No  . Drug use: No    Review of Systems  Constitutional: No fever/chills Eyes: No visual changes. ENT: No sore throat. Cardiovascular: Denies chest pain. Respiratory: Denies shortness of breath. Gastrointestinal: No abdominal pain.  No nausea, no vomiting.  No diarrhea.  No constipation. Genitourinary: Negative for  dysuria. Musculoskeletal: As above  skin: Negative for rash. Neurological: As above   ____________________________________________   PHYSICAL EXAM:  VITAL SIGNS: ED Triage Vitals  Enc Vitals Group     BP 05/21/18 1636 133/66     Pulse Rate 05/21/18 1636 73     Resp 05/21/18 1636 14     Temp 05/21/18 1636 97.6 F (36.4 C)     Temp src --      SpO2 05/21/18 1636 99 %     Weight 05/21/18 1639 215 lb (97.5 kg)     Height 05/21/18 1639 5\' 8"  (1.727 m)     Head Circumference --      Peak Flow --      Pain Score 05/21/18 1639 10     Pain Loc --      Pain Edu? --      Excl. in [place]? --     Constitutional: Alert and oriented. Well appearing and in no acute distress. Eyes: Conjunctivae are normal.  Head: Atraumatic. Nose: No congestion/rhinnorhea. Mouth/Throat: Mucous membranes are moist.  Neck: No stridor.   Cardiovascular: Normal rate, regular rhythm. Grossly normal heart sounds.  Equal and bilateral radial as well as posterior tibial pulses. Respiratory: Normal respiratory effort.  No retractions. Lungs CTAB. Gastrointestinal: Soft and nontender. No distention. No CVA tenderness. Musculoskeletal:   Patient without limb shortening.  Negative straight leg raise bilaterally.  Tenderness palpation to the lateral as well as anterior hip on the left side.  No numbness to the bilateral lower extremities.  Neurologic:  Normal speech and language. No gross focal neurologic deficits are appreciated. Skin:  Skin is warm, dry and intact. No rash noted. Psychiatric: Mood and affect are normal. Speech and behavior are normal.  ____________________________________________   LABS (all labs ordered are listed, but only abnormal results are displayed)  Labs Reviewed  URINALYSIS, COMPLETE (UACMP) WITH MICROSCOPIC - Abnormal; Notable for the following components:      Result Value   Color, Urine YELLOW (*)    APPearance CLEAR (*)    Bacteria, UA RARE (*)    All other components within  normal limits  CBC - Abnormal; Notable for the following components:   Hemoglobin 11.6 (*)    MCV 75.3 (*)    MCH 23.5 (*)    All other components within normal limits  BASIC METABOLIC PANEL - Abnormal; Notable for the following components:   Potassium 3.2 (*)    Glucose, Bld 138 (*)    Calcium 8.3 (*)    All other components within normal limits  PREGNANCY, URINE   ____________________________________________  EKG   ____________________________________________  RADIOLOGY  No acute abnormality to the CT head.  Tiny remote lacunar infarct of the right caudate head.  Hip x-ray with  mild degenerative changes of the left hip.  No other abnormalities noted. ____________________________________________   PROCEDURES  Procedure(s) performed:   Procedures  Critical Care performed:   ____________________________________________   INITIAL IMPRESSION / ASSESSMENT AND PLAN / ED COURSE  Pertinent labs & imaging results that were available during my care of the patient were reviewed by me and considered in my medical decision making (see chart for details).  DDX: Hip arthritis, UTI, kidney stone, complicated migraine, stroke, TIA, paresthesia, hip fracture As part of my medical decision making, I reviewed the following data within the [place] Notes from prior ED visits  ----------------------------------------- 7:21 PM on 05/21/2018 -----------------------------------------  Patient reports improved symptoms.  Able to ambulate with a normal gait while I observed.  Still without any paresthesia to the right upper extremity.  Reassuring CT scan.  Arthritis to the left hip but without any acute finding.  Patient will be discharged with Flexeril as well as diclofenac gel.  Says that she will be following up with her cardiologist tomorrow and will be following up with her primary care doctor.  She is already on a baby aspirin.  Patient denies any weakness to the right  upper extremity or anywhere else.  Unclear diagnosis for the patient's right hand paresthesia.  We discussed possible MRI but she says that she will be able to follow-up as an outpatient.  I believe that this is appropriate given the mild nature of her symptoms and the complete resolution of her symptoms.  She is understanding of the diagnosis well treatment plan and willing to comply. ____________________________________________   FINAL CLINICAL IMPRESSION(S) / ED DIAGNOSES  Paresthesia.  Left hip pain.  NEW MEDICATIONS STARTED DURING THIS VISIT:  New Prescriptions   No medications on file     Note:  This document was prepared using Dragon voice recognition software and may include unintentional dictation errors.     Orbie Pyo, MD 05/21/18 Richardson Chiquito, MD 05/21/18 [phone]

## 2018-06-06 ENCOUNTER — Ambulatory Visit: Payer: Medicaid Other | Admitting: *Deleted

## 2018-06-25 ENCOUNTER — Other Ambulatory Visit: Payer: Self-pay

## 2018-06-25 ENCOUNTER — Emergency Department
Admission: EM | Admit: 2018-06-25 | Discharge: 2018-06-25 | Disposition: A | Discharge status: Home / Self Care | Payer: Medicaid Other | Attending: Emergency Medicine | Admitting: Emergency Medicine

## 2018-06-25 DIAGNOSIS — Z8673 Personal history of transient ischemic attack (TIA), and cerebral infarction without residual deficits: Secondary | ICD-10-CM | POA: Diagnosis not present

## 2018-06-25 DIAGNOSIS — I252 Old myocardial infarction: Secondary | ICD-10-CM | POA: Insufficient documentation

## 2018-06-25 DIAGNOSIS — D573 Sickle-cell trait: Secondary | ICD-10-CM | POA: Diagnosis not present

## 2018-06-25 DIAGNOSIS — J111 Influenza due to unidentified influenza virus with other respiratory manifestations: Secondary | ICD-10-CM | POA: Diagnosis not present

## 2018-06-25 DIAGNOSIS — R69 Illness, unspecified: Secondary | ICD-10-CM

## 2018-06-25 DIAGNOSIS — R6883 Chills (without fever): Secondary | ICD-10-CM | POA: Diagnosis present

## 2018-06-25 LAB — INFLUENZA PANEL BY PCR (TYPE A & B)
Influenza A By PCR: NEGATIVE
Influenza B By PCR: NEGATIVE

## 2018-06-25 MED ORDER — OSELTAMIVIR PHOSPHATE 75 MG PO CAPS: 75.0000 mg | ORAL_CAPSULE | Freq: Two times a day (BID) | ORAL | 0 refills | Status: DC

## 2018-06-25 MED ORDER — MAGIC MOUTHWASH
10.0000 mL | Freq: Once | ORAL | Status: AC
  Administered 2018-06-25: 10 mL via ORAL
  Filled 2018-06-25: qty 10

## 2018-06-25 NOTE — Discharge Instructions (Signed)
1.  You may take Tamiflu as prescribed. 2.  Take Tylenol and/or Ibuprofen as needed for body aches. 3.  Drink plenty of fluids daily. 4.  Return to the ER for worsening symptoms, persistent vomiting, difficulty breathing or other concerns.

## 2018-06-25 NOTE — ED Triage Notes (Signed)
Patient reports flu like symptoms that started yesterday.  Patient reports family diagnosed recently with flu.

## 2018-06-25 NOTE — ED Provider Notes (Signed)
[facility] Emergency Department Provider Note   ____________________________________________   First MD Initiated Contact with Patient 06/25/18 [phone]     (approximate)  I have reviewed the triage vital signs and the nursing notes.   HISTORY  Chief Complaint Influenza    HPI Pam Snyder is a 54 y.o. female who presents to the ED from home with a chief complaint of flulike symptoms.  Patient reports a 1.5-day history of chills, myalgias, sore throat, nasal congestion and dry cough.  States her grandchildren were recently diagnosed with influenza.  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  Denies chest pain, shortness of breath, abdominal pain, nausea, vomiting, diarrhea.  Denies recent travel or trauma.   Past Medical History:  Diagnosis Date  . Abdominal pain   . Anemia   . Arthritis    hips, legs, arms  . 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 Amplatzer closure in 2006  . Car sickness   . Chest wall pain 01/28/2016  . DDD (degenerative disc disease), cervical 01/12/2017  . 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 Headache    Migraines  . Internal hernia   . MI (myocardial infarction) ([place])   . Mitral valve prolapse   . Ovarian cyst   . Plantar fasciitis   . Sickle cell trait ([place])   . 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 Stroke [facility])    15 years ago, no deficits    Patient Active Problem List   Diagnosis Date Noted  . Chest pain 04/04/2018  . DDD (degenerative disc disease), cervical 01/12/2017  . 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 Internal hernia   . Abdominal pain   . 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 Chest wall pain 01/28/2016    Past Surgical History:  Procedure Laterality Date  . CARDIAC CATHETERIZATION     x3 last in 2014, device implanted to help MVP  . ENDOMETRIAL ABLATION  2014   [facility]  . EPIGASTRIC HERNIA REPAIR   01/04/2017   Procedure: HERNIA REPAIR;  Surgeon: Jules Husbands, MD;  Location: [facility] ORS;  Service: General;;  . fibroid cyst    . LAPAROTOMY N/A 01/04/2017   Procedure: EXPLORATORY LAPAROTOMY;  Surgeon: Jules Husbands, MD;  Location: [facility] ORS;  Service: General;  Laterality: N/A;  . LYSIS OF ADHESION  01/04/2017   Procedure: LYSIS OF ADHESION;  Surgeon: Jules Husbands, MD;  Location: [facility] ORS;  Service: General;;    Prior to Admission medications   Medication Sig Start Date End Date Taking? Authorizing Provider  albuterol (PROAIR HFA) 108 (90 Base) MCG/ACT inhaler Inhale 2 puffs into the lungs as needed.    [provider]  chlorthalidone (HYGROTON) 25 MG tablet Take 1 tablet (25 mg total) by mouth daily. 04/06/18   Epifanio Lesches, MD  cyclobenzaprine (FLEXERIL) 5 MG tablet Take 1 tablet (5 mg total) by mouth 3 (three) times daily as needed for muscle spasms. 05/21/18   Schaevitz, Randall An, MD  diclofenac sodium (VOLTAREN) 1 % GEL Apply to affected area up to 4 times a day as needed for pain. 05/21/18   Orbie Pyo, MD  oseltamivir (TAMIFLU) 75 MG capsule Take 1 capsule (75 mg total) by mouth 2 (two) times daily. 06/25/18   Paulette Blanch, MD  polyethylene glycol [facility] / Floria Raveling) packet Take 17 g by mouth every other day.    [provider]    Allergies No known allergies  Family History  Problem Relation Age of Onset  . Lung cancer Mother   . Leukemia Father   . Sickle cell anemia Brother   . Colon cancer Maternal Grandmother   . Breast cancer Maternal Aunt   . Sickle cell anemia Daughter     Social History Social History   Tobacco Use  . Smoking status: Never Smoker  . Smokeless tobacco: Never Used  Substance Use Topics  . Alcohol use: No  . Drug use: No    Review of Systems  Constitutional: Positive for chills. Eyes: No visual changes. ENT: Positive for nasal congestion and sore throat. Cardiovascular: Denies chest  pain. Respiratory: Positive for dry cough.  Denies shortness of breath. Gastrointestinal: No abdominal pain.  No nausea, no vomiting.  No diarrhea.  No constipation. Genitourinary: Negative for dysuria. Musculoskeletal: Negative for back pain. Skin: Negative for rash. Neurological: Negative for headaches, focal weakness or numbness.   ____________________________________________   PHYSICAL EXAM:  VITAL SIGNS: ED Triage Vitals  Enc Vitals Group     BP 06/25/18 0057 (!) 153/78     Pulse Rate 06/25/18 0057 68     Resp 06/25/18 0057 18     Temp 06/25/18 0057 97.9 F (36.6 C)     Temp Source 06/25/18 0057 Oral     SpO2 --      Weight 06/25/18 0050 220 lb (99.8 kg)     Height 06/25/18 0050 5\' 8"  (1.727 m)     Head Circumference --      Peak Flow --      Pain Score 06/25/18 0050 0     Pain Loc --      Pain Edu? --      Excl. in [place]? --     Constitutional: Alert and oriented. Well appearing and in no acute distress. Eyes: Conjunctivae are normal. PERRL. EOMI. Head: Atraumatic. Nose: Congestion/rhinnorhea. Mouth/Throat: Mucous membranes are moist.  Oropharynx mildly erythematous without tonsillar swelling, exudates or peritonsillar abscess.  There is no hoarse or muffled voice.  There is no drooling. Neck: No stridor.  Supple neck without meningismus. Hematological/Lymphatic/Immunilogical: No cervical lymphadenopathy. Cardiovascular: Normal rate, regular rhythm. Grossly normal heart sounds.  Good peripheral circulation. Respiratory: Normal respiratory effort.  No retractions. Lungs CTAB. Gastrointestinal: Soft and nontender. No distention. No abdominal bruits. No CVA tenderness. Musculoskeletal: No lower extremity tenderness nor edema.  No joint effusions. Neurologic:  Normal speech and language. No gross focal neurologic deficits are appreciated. No gait instability. Skin:  Skin is warm, dry and intact. No rash noted.  No petechiae. Psychiatric: Mood and affect are normal.  Speech and behavior are normal.  ____________________________________________   LABS (all labs ordered are listed, but only abnormal results are displayed)  Labs Reviewed  INFLUENZA PANEL BY PCR (TYPE A & B)   ____________________________________________  EKG  None ____________________________________________  RADIOLOGY  ED MD interpretation: None  Official radiology report(s): No results found.  ____________________________________________   PROCEDURES  Procedure(s) performed: None  Procedures  Critical Care performed: No  ____________________________________________   INITIAL IMPRESSION / ASSESSMENT AND PLAN / ED COURSE  As part of my medical decision making, I reviewed the following data within the [facility] notes reviewed and incorporated, Labs reviewed and Notes from prior ED visits   53 year old female who presents with flulike symptoms.  Influenza a and B are negative.  However, given patient's exposure with close family members with influenza  and her presenting symptoms, we discussed starting Tamiflu.  Will discharge home with prescription for Tamiflu.  Magic mouthwash given in the ED for sore throat.  Strict return precautions given.  Patient verbalizes understanding and agrees with plan of care.      ____________________________________________   FINAL CLINICAL IMPRESSION(S) / ED DIAGNOSES  Final diagnoses:  Influenza-like illness     ED Discharge Orders         Ordered    oseltamivir (TAMIFLU) 75 MG capsule  2 times daily     06/25/18 0501           Note:  This document was prepared using Dragon voice recognition software and may include unintentional dictation errors.    Paulette Blanch, MD 06/25/18 [phone]

## 2018-06-25 NOTE — ED Notes (Signed)
Patient resting quietly with eyes closed in no acute distress at this time.

## 2018-06-26 ENCOUNTER — Emergency Department: Payer: Medicaid Other

## 2018-06-26 ENCOUNTER — Emergency Department
Admission: EM | Admit: 2018-06-26 | Discharge: 2018-06-26 | Disposition: A | Discharge status: Home / Self Care | Payer: Medicaid Other | Attending: Emergency Medicine | Admitting: Emergency Medicine

## 2018-06-26 DIAGNOSIS — J181 Lobar pneumonia, unspecified organism: Secondary | ICD-10-CM | POA: Diagnosis not present

## 2018-06-26 DIAGNOSIS — R05 Cough: Secondary | ICD-10-CM | POA: Diagnosis present

## 2018-06-26 DIAGNOSIS — Z79899 Other long term (current) drug therapy: Secondary | ICD-10-CM | POA: Insufficient documentation

## 2018-06-26 DIAGNOSIS — J189 Pneumonia, unspecified organism: Secondary | ICD-10-CM

## 2018-06-26 LAB — COMPREHENSIVE METABOLIC PANEL
ALT: 18 U/L (ref 0–44)
ANION GAP: 6 (ref 5–15)
AST: 23 U/L (ref 15–41)
Albumin: 3.5 g/dL (ref 3.5–5.0)
Alkaline Phosphatase: 78 U/L (ref 38–126)
BUN: 8 mg/dL (ref 6–20)
CHLORIDE: 105 mmol/L (ref 98–111)
CO2: 26 mmol/L (ref 22–32)
Calcium: 8.3 mg/dL — ABNORMAL LOW (ref 8.9–10.3)
Creatinine, Ser: 0.69 mg/dL (ref 0.44–1.00)
GFR calc non Af Amer: >60 mL/min (ref >60)
GLUCOSE: 142 mg/dL — AB (ref 70–99)
Potassium: 3.8 mmol/L (ref 3.5–5.1)
SODIUM: 137 mmol/L (ref 135–145)
Total Bilirubin: 0.6 mg/dL (ref 0.3–1.2)
Total Protein: 6.3 g/dL — ABNORMAL LOW (ref 6.5–8.1)

## 2018-06-26 LAB — CBC
HEMATOCRIT: 36.2 % (ref 36.0–46.0)
HEMOGLOBIN: 11.5 g/dL — AB (ref 12.0–15.0)
MCH: 23.7 pg — AB (ref 26.0–34.0)
MCHC: 31.8 g/dL (ref 30.0–36.0)
MCV: 74.5 fL — AB (ref 80.0–100.0)
NRBC: 0 % (ref 0.0–0.2)
Platelets: 186 10*3/uL (ref 150–400)
RBC: 4.86 MIL/uL (ref 3.87–5.11)
RDW: 15.3 % (ref 11.5–15.5)
WBC: 9.6 10*3/uL (ref 4.0–10.5)

## 2018-06-26 MED ORDER — AZITHROMYCIN 250 MG PO TABS: ORAL_TABLET | ORAL | 0 refills | Status: AC

## 2018-06-26 MED ORDER — BENZONATATE 100 MG PO CAPS: 100.0000 mg | ORAL_CAPSULE | Freq: Three times a day (TID) | ORAL | 0 refills | Status: AC | PRN

## 2018-06-26 MED ORDER — BENZONATATE 100 MG PO CAPS
100.0000 mg | ORAL_CAPSULE | Freq: Once | ORAL | Status: AC
  Administered 2018-06-26: 100 mg via ORAL
  Filled 2018-06-26 (×2): qty 1

## 2018-06-26 MED ORDER — AZITHROMYCIN 500 MG PO TABS
500.0000 mg | ORAL_TABLET | Freq: Once | ORAL | Status: AC
  Administered 2018-06-26: 500 mg via ORAL
  Filled 2018-06-26: qty 1

## 2018-06-26 NOTE — ED Triage Notes (Signed)
Pt to ED with c/o of flu like symptoms. Pt treated yesterday w/ negative flu result. Pt states meds prescribed not helping. Pt states she woke this morning with productive cough.

## 2018-06-26 NOTE — ED Notes (Signed)
ED Provider at bedside. 

## 2018-06-26 NOTE — ED Provider Notes (Signed)
[facility] Emergency Department Provider Note  ____________________________________________   First MD Initiated Contact with Patient 06/26/18 1401     (approximate)  I have reviewed the triage vital signs and the nursing notes.   HISTORY  Chief Complaint Cough    HPI Pam Snyder is a 53 y.o. female presents to the emergency department with 4-day history of cough generalized myalgias and subjective fevers.  She does admit to sick contact (2 grandchildren) currently have the flu.  Patient was seen in the emergency department yesterday and prescribed Tamiflu.  Patient returns stating that symptoms have worsened he was seen.  Patient states that her cough is nonproductive before now it is in addition patient states that she was not wheezing before now she is.   Past Medical History:  Diagnosis Date  . Abdominal pain   . Anemia   . Arthritis    hips, legs, arms  . 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 Amplatzer closure in 2006  . Car sickness   . Chest wall pain 01/28/2016  . DDD (degenerative disc disease), cervical 01/12/2017  . 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 Headache    Migraines  . Internal hernia   . MI (myocardial infarction) ([place])   . Mitral valve prolapse   . Ovarian cyst   . Plantar fasciitis   . Sickle cell trait ([place])   . 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 Stroke [facility])    15 years ago, no deficits    Patient Active Problem List   Diagnosis Date Noted  . Chest pain 04/04/2018  . DDD (degenerative disc disease), cervical 01/12/2017  . 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 Internal hernia   . Abdominal pain   . 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 Chest wall pain 01/28/2016    Past Surgical History:  Procedure Laterality Date  . CARDIAC CATHETERIZATION     x3 last in 2014, device implanted to help MVP  . ENDOMETRIAL ABLATION  2014   [facility]  .  EPIGASTRIC HERNIA REPAIR  01/04/2017   Procedure: HERNIA REPAIR;  Surgeon: Jules Husbands, MD;  Location: [facility] ORS;  Service: General;;  . fibroid cyst    . LAPAROTOMY N/A 01/04/2017   Procedure: EXPLORATORY LAPAROTOMY;  Surgeon: Jules Husbands, MD;  Location: [facility] ORS;  Service: General;  Laterality: N/A;  . LYSIS OF ADHESION  01/04/2017   Procedure: LYSIS OF ADHESION;  Surgeon: Jules Husbands, MD;  Location: [facility] ORS;  Service: General;;    Prior to Admission medications   Medication Sig Start Date End Date Taking? Authorizing Provider  albuterol (PROAIR HFA) 108 (90 Base) MCG/ACT inhaler Inhale 2 puffs into the lungs as needed.    [provider]  chlorthalidone (HYGROTON) 25 MG tablet Take 1 tablet (25 mg total) by mouth daily. 04/06/18   Epifanio Lesches, MD  cyclobenzaprine (FLEXERIL) 5 MG tablet Take 1 tablet (5 mg total) by mouth 3 (three) times daily as needed for muscle spasms. 05/21/18   Schaevitz, Randall An, MD  diclofenac sodium (VOLTAREN) 1 % GEL Apply to affected area up to 4 times a day as needed for pain. 05/21/18   Orbie Pyo, MD  oseltamivir (TAMIFLU) 75 MG capsule Take 1 capsule (75 mg total) by mouth 2 (two) times daily. 06/25/18   Paulette Blanch, MD  polyethylene glycol [facility] / Floria Raveling) packet Take 17 g by mouth every other day.  [provider]    Allergies No known allergies  Family History  Problem Relation Age of Onset  . Lung cancer Mother   . Leukemia Father   . Sickle cell anemia Brother   . Colon cancer Maternal Grandmother   . Breast cancer Maternal Aunt   . Sickle cell anemia Daughter     Social History Social History   Tobacco Use  . Smoking status: Never Smoker  . Smokeless tobacco: Never Used  Substance Use Topics  . Alcohol use: No  . Drug use: No    Review of Systems Constitutional: No fever/chills Eyes: No visual changes. ENT: No sore throat. Cardiovascular: Denies chest pain. Respiratory:  Denies shortness of breath. Gastrointestinal: No abdominal pain.  No nausea, no vomiting.  No diarrhea.  No constipation. Genitourinary: Negative for dysuria. Musculoskeletal: Negative for neck pain.  Negative for back pain. Integumentary: Negative for rash. Neurological: Negative for headaches, focal weakness or numbness.   ____________________________________________   PHYSICAL EXAM:  VITAL SIGNS: ED Triage Vitals  Enc Vitals Group     BP 06/26/18 1239 137/75     Pulse Rate 06/26/18 1239 (!) 102     Resp 06/26/18 1239 16     Temp 06/26/18 1239 98.7 F (37.1 C)     Temp Source 06/26/18 1239 Oral     SpO2 06/26/18 1239 98 %     Weight --      Height --      Head Circumference --      Peak Flow --      Pain Score 06/26/18 1240 9     Pain Loc --      Pain Edu? --      Excl. in [place]? --     Constitutional: Alert and oriented. Well appearing and in no acute distress. Eyes: Conjunctivae are normal. Head: Atraumatic. Mouth/Throat: Mucous membranes are moist.  Oropharynx non-erythematous. Neck: No stridor.   Cardiovascular: Normal rate, regular rhythm. Good peripheral circulation. Grossly normal heart sounds. Respiratory: Normal respiratory effort.  No retractions.  Bibasilar rhonchi  gastrointestinal: Soft and nontender. No distention.  Musculoskeletal: No lower extremity tenderness nor edema. No gross deformities of extremities. Neurologic:  Normal speech and language. No gross focal neurologic deficits are appreciated.  Skin:  Skin is warm, dry and intact. No rash noted. Psychiatric: Mood and affect are normal. Speech and behavior are normal.  ____________________________________________   LABS (all labs ordered are listed, but only abnormal results are displayed)  Labs Reviewed  COMPREHENSIVE METABOLIC PANEL - Abnormal; Notable for the following components:      Result Value   Glucose, Bld 142 (*)    Calcium 8.3 (*)    Total Protein 6.3 (*)    All other components  within normal limits  CBC - Abnormal; Notable for the following components:   Hemoglobin 11.5 (*)    MCV 74.5 (*)    MCH 23.7 (*)    All other components within normal limits   ____________________________________________  EKG  ED ECG REPORT I, [place] N BROWN, the attending physician, personally viewed and interpreted this ECG.   Date: 06/26/2018  EKG Time: 12:50 PM  Rate: 99  Rhythm: Normal sinus rhythm with premature ventricular contraction  Axis: Left axis deviation  Intervals: Normal  ST&T Change: None  ____________________________________________  RADIOLOGY I, [place] N BROWN, personally viewed and evaluated these images (plain radiographs) as part of my medical decision making, as well as reviewing the written report by the radiologist.  ED MD interpretation: Lower lobe bronchopneumonia on chest x-ray per radiologist.  Official radiology report(s): Dg Chest 2 View  Result Date: 06/26/2018 CLINICAL DATA:  53 year old female with productive cough, shortness of breath and subjective fever for 2 days. EXAM: CHEST - 2 VIEW COMPARISON:  Chest CTA 04/04/2018 and earlier. FINDINGS: Stable mild cardiomegaly. There is an interatrial occluded device redemonstrated. Other mediastinal contours are within normal limits. Visualized tracheal air column is within normal limits. There is patchy peribronchial opacity in the right lung base on the PA view which is not well correlated on the lateral. No pleural effusion. No other confluent pulmonary opacity. No pneumothorax or edema. No acute osseous abnormality identified. Negative visible bowel gas pattern. IMPRESSION: Right lower lung opacity compatible with Bronchopneumonia. No pleural effusion. Followup PA and lateral chest X-ray is recommended in 3-4 weeks following trial of antibiotic therapy to ensure resolution and exclude underlying malignancy. Electronically Signed   By: Genevie Ann M.D.   On: 06/26/2018 13:46      Procedures   ____________________________________________   INITIAL IMPRESSION / ASSESSMENT AND PLAN / ED COURSE  As part of my medical decision making, I reviewed the following data within the electronic MEDICAL RECORD NUMBER  53 year old female present with above-stated history and physical exam concerning for possible influenza versus pneumonia.  Patient is influenza negative yesterday however clinical exam and chest x-ray today consistent with bronchopneumonia.  As such patient given azithromycin and Tessalon the emergency department will be prescribed same for home. ____________________________________________  FINAL CLINICAL IMPRESSION(S) / ED DIAGNOSES  Final diagnoses:  Community acquired pneumonia of right lower lobe of lung ([place])     MEDICATIONS GIVEN DURING THIS VISIT:  Medications  azithromycin (ZITHROMAX) tablet 500 mg (has no administration in time range)  benzonatate (TESSALON) capsule 100 mg (has no administration in time range)     ED Discharge Orders    None       Note:  This document was prepared using Dragon voice recognition software and may include unintentional dictation errors.    Gregor Hams, MD 06/26/18 [phone]

## 2018-07-01 ENCOUNTER — Emergency Department
Admission: EM | Admit: 2018-07-01 | Discharge: 2018-07-01 | Disposition: A | Discharge status: Home / Self Care | Payer: Medicaid Other | Attending: Emergency Medicine | Admitting: Emergency Medicine

## 2018-07-01 ENCOUNTER — Other Ambulatory Visit: Payer: Self-pay

## 2018-07-01 ENCOUNTER — Emergency Department: Payer: Medicaid Other

## 2018-07-01 ENCOUNTER — Encounter: Payer: Self-pay | Admitting: Intensive Care

## 2018-07-01 DIAGNOSIS — J189 Pneumonia, unspecified organism: Secondary | ICD-10-CM | POA: Diagnosis not present

## 2018-07-01 DIAGNOSIS — Z79899 Other long term (current) drug therapy: Secondary | ICD-10-CM | POA: Insufficient documentation

## 2018-07-01 DIAGNOSIS — R079 Chest pain, unspecified: Secondary | ICD-10-CM | POA: Diagnosis present

## 2018-07-01 DIAGNOSIS — J181 Lobar pneumonia, unspecified organism: Secondary | ICD-10-CM

## 2018-07-01 LAB — CBC
HCT: 35.6 % — ABNORMAL LOW (ref 36.0–46.0)
Hemoglobin: 11.2 g/dL — ABNORMAL LOW (ref 12.0–15.0)
MCH: 23.6 pg — ABNORMAL LOW (ref 26.0–34.0)
MCHC: 31.5 g/dL (ref 30.0–36.0)
MCV: 74.9 fL — ABNORMAL LOW (ref 80.0–100.0)
Platelets: 272 10*3/uL (ref 150–400)
RBC: 4.75 MIL/uL (ref 3.87–5.11)
RDW: 15 % (ref 11.5–15.5)
WBC: 5.8 10*3/uL (ref 4.0–10.5)
nRBC: 0.5 % — ABNORMAL HIGH (ref 0.0–0.2)

## 2018-07-01 LAB — LACTIC ACID, PLASMA: Lactic Acid, Venous: 0.9 mmol/L (ref 0.5–1.9)

## 2018-07-01 LAB — BASIC METABOLIC PANEL
Anion gap: 3 — ABNORMAL LOW (ref 5–15)
BUN: 9 mg/dL (ref 6–20)
CO2: 29 mmol/L (ref 22–32)
Calcium: 8.6 mg/dL — ABNORMAL LOW (ref 8.9–10.3)
Chloride: 106 mmol/L (ref 98–111)
Creatinine, Ser: 0.77 mg/dL (ref 0.44–1.00)
GFR calc Af Amer: >60 mL/min (ref >60)
GFR calc non Af Amer: >60 mL/min (ref >60)
Glucose, Bld: 106 mg/dL — ABNORMAL HIGH (ref 70–99)
Potassium: 4.4 mmol/L (ref 3.5–5.1)
Sodium: 138 mmol/L (ref 135–145)

## 2018-07-01 LAB — TROPONIN I: Troponin I: <0.03 ng/mL (ref <0.03)

## 2018-07-01 MED ORDER — HYDROCOD POLST-CPM POLST ER 10-8 MG/5ML PO SUER
5.0000 mL | Freq: Once | ORAL | Status: AC
  Administered 2018-07-01: 5 mL via ORAL
  Filled 2018-07-01: qty 5

## 2018-07-01 MED ORDER — LEVOFLOXACIN 750 MG PO TABS: 750.0000 mg | ORAL_TABLET | Freq: Every day | ORAL | 0 refills | Status: AC

## 2018-07-01 MED ORDER — IOHEXOL 350 MG/ML SOLN
75.0000 mL | Freq: Once | INTRAVENOUS | Status: AC | PRN
  Administered 2018-07-01: 75 mL via INTRAVENOUS

## 2018-07-01 MED ORDER — LEVOFLOXACIN 750 MG PO TABS
750.0000 mg | ORAL_TABLET | Freq: Once | ORAL | Status: AC
  Administered 2018-07-01: 750 mg via ORAL
  Filled 2018-07-01: qty 1

## 2018-07-01 MED ORDER — GUAIFENESIN-CODEINE 100-10 MG/5ML PO SOLN: 5.0000 mL | Freq: Four times a day (QID) | ORAL | 0 refills | Status: DC | PRN

## 2018-07-01 NOTE — ED Notes (Signed)
1 set of blood culture sent to lab

## 2018-07-01 NOTE — ED Triage Notes (Signed)
Patient was diagnosed with the flu on Friday. Was seen on Sunday and diagnosed with pneumonia. Patient is back today because she reports having some Right sided chest pain that radiates in arm and progressively has gotten worse. Also c/o SOB

## 2018-07-01 NOTE — ED Provider Notes (Signed)
[facility] Emergency Department Provider Note  Time seen: 7:54 PM  I have reviewed the triage vital signs and the nursing notes.   HISTORY  Chief Complaint Chest Pain and Pneumonia    HPI Pam Snyder is a 53 y.o. female with a past medical history of anemia, chest wall pain, prior MI, presents to the emergency department for right-sided chest pain.  According to the patient for the past 1 week she has been coughing with congestion went to her primary care doctor 1 week ago was diagnosed with likely influenza even though her flu test was negative.  Patient was treated with Tamiflu continue to worsen with cough and chest pain especially in the right lower chest, went to her doctor on Sunday was diagnosed with pneumonia and started on doxycycline which the patient has finished.  Patient states despite finishing the antibiotic she continues to have pain in this area especially with deep inspiration.  States she has been coughing, denies any known fever.  Denies any vomiting.  States body aches at times but denies any specific leg pain or swelling.   Past Medical History:  Diagnosis Date  . Abdominal pain   . Anemia   . Arthritis    hips, legs, arms  . 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 Amplatzer closure in 2006  . Car sickness   . Chest wall pain 01/28/2016  . DDD (degenerative disc disease), cervical 01/12/2017  . 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 Headache    Migraines  . Internal hernia   . MI (myocardial infarction) ([place])   . Mitral valve prolapse   . Ovarian cyst   . Plantar fasciitis   . Sickle cell trait ([place])   . 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 Stroke [facility])    15 years ago, no deficits    Patient Active Problem List   Diagnosis Date Noted  . Chest pain 04/04/2018  . DDD (degenerative disc disease), cervical 01/12/2017  . 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 Internal hernia   . Abdominal pain   . 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 Chest wall pain 01/28/2016    Past Surgical History:  Procedure Laterality Date  . CARDIAC CATHETERIZATION     x3 last in 2014, device implanted to help MVP  . ENDOMETRIAL ABLATION  2014   [facility]  . EPIGASTRIC HERNIA REPAIR  01/04/2017   Procedure: HERNIA REPAIR;  Surgeon: Jules Husbands, MD;  Location: [facility] ORS;  Service: General;;  . fibroid cyst    . LAPAROTOMY N/A 01/04/2017   Procedure: EXPLORATORY LAPAROTOMY;  Surgeon: Jules Husbands, MD;  Location: [facility] ORS;  Service: General;  Laterality: N/A;  . LYSIS OF ADHESION  01/04/2017   Procedure: LYSIS OF ADHESION;  Surgeon: Jules Husbands, MD;  Location: [facility] ORS;  Service: General;;    Prior to Admission medications   Medication Sig Start Date End Date Taking? Authorizing Provider  albuterol (PROAIR HFA) 108 (90 Base) MCG/ACT inhaler Inhale 2 puffs into the lungs as needed.    [provider]  azithromycin (ZITHROMAX Z-PAK) 250 MG tablet Take 2 tablets (500 mg) on  Day 1,  followed by 1 tablet (250 mg) once daily on Days 2 through 5. 06/26/18 07/01/18  Gregor Hams, MD  benzonatate (TESSALON PERLES) 100 MG capsule Take 1 capsule (100 mg total) by mouth 3 (three) times daily as needed for cough. 06/26/18 06/26/19  Gregor Hams, MD  chlorthalidone (HYGROTON) 25 MG tablet Take 1 tablet (25 mg total) by mouth daily. 04/06/18   Epifanio Lesches, MD  cyclobenzaprine (FLEXERIL) 5 MG tablet Take 1 tablet (5 mg total) by mouth 3 (three) times daily as needed for muscle spasms. 05/21/18   Schaevitz, Randall An, MD  diclofenac sodium (VOLTAREN) 1 % GEL Apply to affected area up to 4 times a day as needed for pain. 05/21/18   Orbie Pyo, MD  oseltamivir (TAMIFLU) 75 MG capsule Take 1 capsule (75 mg total) by mouth 2 (two) times daily. 06/25/18   Paulette Blanch, MD  polyethylene glycol [facility] / Floria Raveling) packet Take 17 g by mouth every other day.     [provider]    Allergies  Allergen Reactions  . No Known Allergies     Family History  Problem Relation Age of Onset  . Lung cancer Mother   . Leukemia Father   . Sickle cell anemia Brother   . Colon cancer Maternal Grandmother   . Breast cancer Maternal Aunt   . Sickle cell anemia Daughter     Social History Social History   Tobacco Use  . Smoking status: Never Smoker  . Smokeless tobacco: Never Used  Substance Use Topics  . Alcohol use: No  . Drug use: No    Review of Systems Constitutional: Negative for fever. Cardiovascular: Right lower chest pain worse with deep inspiration.  Positive for cough. Respiratory: Negative for shortness of breath.  Positive for cough. Gastrointestinal: Negative for abdominal pain Genitourinary: Negative for urinary compaints Musculoskeletal: Negative for musculoskeletal complaints Skin: Negative for skin complaints  Neurological: Negative for headache All other ROS negative  ____________________________________________   PHYSICAL EXAM:  VITAL SIGNS: ED Triage Vitals [07/01/18 1756]  Enc Vitals Group     BP (!) 159/76     Pulse Rate 72     Resp 18     Temp 99.2 F (37.3 C)     Temp Source Oral     SpO2 99 %     Weight 220 lb (99.8 kg)     Height 5\' 8"  (1.727 m)     Head Circumference      Peak Flow      Pain Score 9     Pain Loc      Pain Edu?      Excl. in [place]?    Constitutional: Alert and oriented. Well appearing and in no distress. Eyes: Normal exam ENT   Head: Normocephalic and atraumatic   Mouth/Throat: Mucous membranes are moist. Cardiovascular: Normal rate, regular rhythm.  Respiratory: Normal respiratory effort without tachypnea nor retractions. Breath sounds are clear  Gastrointestinal: Soft and nontender. No distention.  Musculoskeletal: Nontender with normal range of motion in all extremities.  Neurologic:  Normal speech and language. No gross focal neurologic deficits  Skin:  Skin  is warm, dry and intact.  Psychiatric: Mood and affect are normal.   ____________________________________________    EKG  EKG viewed and interpreted by myself shows a normal sinus rhythm at 73 bpm with a narrow QRS, normal axis, normal intervals, nonspecific ST changes with inferolateral T wave inversions unchanged from prior EKG.  ____________________________________________    RADIOLOGY  Chest x-ray shows stable right basilar infiltrate  CT shows pneumonia.  No pulmonary embolism.  ____________________________________________   INITIAL IMPRESSION / ASSESSMENT AND PLAN / ED COURSE  Pertinent labs & imaging results that were available during my care of the patient  were reviewed by me and considered in my medical decision making (see chart for details).  Patient presents to the emergency department for right lower chest pain cough and congestion.  Patient describes the chest pain is pleuritic worse with deep inspiration.  Chest x-ray shows possible stable opacification in the right lower chest.  Lab work is largely within normal limits.  However given the patient's continued significant chest discomfort with pleuritic nature we will obtain CT angiography imaging of the chest to rule out pulmonary embolism.  Otherwise the patient appears well, no distress.  CT scan confirms pneumonia in the right lower lobe, no pulmonary medicine.  We will discharge with Levaquin for the next 7 days have the patient follow-up with her primary care doctor.  Patient agreeable to plan of care.  ____________________________________________   FINAL CLINICAL IMPRESSION(S) / ED DIAGNOSES  Chest pain Pneumonia   Harvest Dark, MD 07/01/18 2029

## 2018-07-06 LAB — CULTURE, BLOOD (ROUTINE X 2)
Culture: NO GROWTH
Special Requests: ADEQUATE

## 2019-10-16 ENCOUNTER — Other Ambulatory Visit: Payer: Self-pay | Admitting: Gastroenterology

## 2019-10-16 DIAGNOSIS — R140 Abdominal distension (gaseous): Secondary | ICD-10-CM

## 2019-10-16 DIAGNOSIS — R1032 Left lower quadrant pain: Secondary | ICD-10-CM

## 2019-10-20 ENCOUNTER — Ambulatory Visit
Admission: RE | Admit: 2019-10-20 | Discharge: 2019-10-20 | Disposition: A | Discharge status: Home / Self Care | Payer: Medicaid Other | Source: Ambulatory Visit | Attending: Gastroenterology | Admitting: Gastroenterology

## 2019-10-20 ENCOUNTER — Other Ambulatory Visit: Payer: Self-pay

## 2019-10-20 DIAGNOSIS — R140 Abdominal distension (gaseous): Secondary | ICD-10-CM

## 2019-10-20 DIAGNOSIS — R1032 Left lower quadrant pain: Secondary | ICD-10-CM

## 2019-11-03 ENCOUNTER — Other Ambulatory Visit: Payer: Self-pay | Admitting: Student

## 2019-11-03 ENCOUNTER — Other Ambulatory Visit (HOSPITAL_COMMUNITY): Payer: Self-pay | Admitting: Student

## 2019-11-03 DIAGNOSIS — R079 Chest pain, unspecified: Secondary | ICD-10-CM

## 2019-11-03 DIAGNOSIS — R42 Dizziness and giddiness: Secondary | ICD-10-CM

## 2019-11-14 ENCOUNTER — Ambulatory Visit
Admission: RE | Admit: 2019-11-14 | Discharge: 2019-11-14 | Disposition: A | Discharge status: Home / Self Care | Payer: Medicaid Other | Source: Ambulatory Visit | Attending: Student | Admitting: Student

## 2019-11-14 ENCOUNTER — Other Ambulatory Visit: Payer: Self-pay

## 2019-11-14 DIAGNOSIS — I340 Nonrheumatic mitral (valve) insufficiency: Secondary | ICD-10-CM | POA: Insufficient documentation

## 2019-11-14 DIAGNOSIS — R42 Dizziness and giddiness: Secondary | ICD-10-CM | POA: Insufficient documentation

## 2019-11-14 DIAGNOSIS — I341 Nonrheumatic mitral (valve) prolapse: Secondary | ICD-10-CM | POA: Diagnosis not present

## 2019-11-14 DIAGNOSIS — Z8673 Personal history of transient ischemic attack (TIA), and cerebral infarction without residual deficits: Secondary | ICD-10-CM | POA: Diagnosis not present

## 2019-11-14 DIAGNOSIS — I252 Old myocardial infarction: Secondary | ICD-10-CM | POA: Insufficient documentation

## 2019-11-14 DIAGNOSIS — R079 Chest pain, unspecified: Secondary | ICD-10-CM | POA: Diagnosis not present

## 2019-11-14 NOTE — Progress Notes (Signed)
*  PRELIMINARY RESULTS* Echocardiogram 2D Echocardiogram has been performed.  Sherrie Sport 11/14/2019, 12:30 PM

## 2019-11-16 ENCOUNTER — Encounter
Admission: RE | Admit: 2019-11-16 | Discharge: 2019-11-16 | Disposition: A | Discharge status: Home / Self Care | Payer: Medicaid Other | Source: Ambulatory Visit | Attending: Student | Admitting: Student

## 2019-11-16 ENCOUNTER — Other Ambulatory Visit: Payer: Self-pay

## 2019-11-16 DIAGNOSIS — R079 Chest pain, unspecified: Secondary | ICD-10-CM | POA: Diagnosis not present

## 2019-11-16 DIAGNOSIS — R42 Dizziness and giddiness: Secondary | ICD-10-CM | POA: Insufficient documentation

## 2019-11-16 MED ORDER — TECHNETIUM TC 99M TETROFOSMIN IV KIT
30.0000 | PACK | Freq: Once | INTRAVENOUS | Status: AC | PRN
  Administered 2019-11-16: 31.199 via INTRAVENOUS

## 2019-11-16 MED ORDER — REGADENOSON 0.4 MG/5ML IV SOLN
0.4000 mg | Freq: Once | INTRAVENOUS | Status: AC
  Administered 2019-11-16: 0.4 mg via INTRAVENOUS

## 2019-11-16 MED ORDER — TECHNETIUM TC 99M TETROFOSMIN IV KIT
10.0000 | PACK | Freq: Once | INTRAVENOUS | Status: AC | PRN
  Administered 2019-11-16: 10.09 via INTRAVENOUS

## 2019-11-28 LAB — NM MYOCAR MULTI W/SPECT W/WALL MOTION / EF
Estimated workload: 1 METS
Exercise duration (min): 1 min
Exercise duration (sec): 12 s
LV dias vol: 154 mL (ref 46–106)
LV sys vol: 82 mL
Peak HR: 97 {beats}/min
Percent HR: 57 %
Rest HR: 55 {beats}/min
SDS: 2
SRS: 12
SSS: 4
TID: 1.04

## 2020-06-18 ENCOUNTER — Other Ambulatory Visit: Payer: Self-pay

## 2020-06-18 ENCOUNTER — Emergency Department: Payer: Medicaid Other

## 2020-06-18 ENCOUNTER — Emergency Department
Admission: EM | Admit: 2020-06-18 | Discharge: 2020-06-18 | Disposition: A | Discharge status: Home / Self Care | Payer: Medicaid Other | Attending: Emergency Medicine | Admitting: Emergency Medicine

## 2020-06-18 DIAGNOSIS — Z20822 Contact with and (suspected) exposure to covid-19: Secondary | ICD-10-CM | POA: Diagnosis not present

## 2020-06-18 DIAGNOSIS — Z8673 Personal history of transient ischemic attack (TIA), and cerebral infarction without residual deficits: Secondary | ICD-10-CM | POA: Diagnosis not present

## 2020-06-18 DIAGNOSIS — R059 Cough, unspecified: Secondary | ICD-10-CM | POA: Diagnosis present

## 2020-06-18 DIAGNOSIS — J988 Other specified respiratory disorders: Secondary | ICD-10-CM | POA: Diagnosis not present

## 2020-06-18 LAB — POC SARS CORONAVIRUS 2 AG -  ED: SARS Coronavirus 2 Ag: NEGATIVE

## 2020-06-18 MED ORDER — PREDNISONE 50 MG PO TABS: 50.0000 mg | ORAL_TABLET | Freq: Every day | ORAL | 0 refills | Status: DC

## 2020-06-18 MED ORDER — AZITHROMYCIN 500 MG PO TABS
500.0000 mg | ORAL_TABLET | Freq: Once | ORAL | Status: AC
  Administered 2020-06-18: 500 mg via ORAL
  Filled 2020-06-18: qty 1

## 2020-06-18 MED ORDER — PSEUDOEPH-BROMPHEN-DM 30-2-10 MG/5ML PO SYRP: 10.0000 mL | ORAL_SOLUTION | Freq: Four times a day (QID) | ORAL | 0 refills | Status: DC | PRN

## 2020-06-18 MED ORDER — ONDANSETRON 4 MG PO TBDP: 4.0000 mg | ORAL_TABLET | Freq: Three times a day (TID) | ORAL | 0 refills | Status: DC | PRN

## 2020-06-18 MED ORDER — AZITHROMYCIN 250 MG PO TABS: ORAL_TABLET | ORAL | 0 refills | Status: DC

## 2020-06-18 NOTE — ED Triage Notes (Addendum)
Pt comes with c/o headache and cough. Pt states recent covid test but it was negative on Dec 29. Pt states some belly pain.  Pt states she is unsure if the test was even completed right. Pt states some diarrhea as well.

## 2020-06-18 NOTE — ED Provider Notes (Signed)
[facility] Emergency Department Provider Note  ____________________________________________  Time seen: Approximately 8:32 PM  I have reviewed the triage vital signs and the nursing notes.   HISTORY  Chief Complaint Cough and Headache    HPI Pam Snyder is a 55 y.o. female who presents the emergency department  complaining of headache, cough, nasal congestion, nausea.  Patient states that she began with symptoms December 23.  On December 29 she saw her primary care but states that she does not believe they performed her swab appropriately.  Patient states that they swabbed the very end of her nose and did not actually go into the nares itself.  She had a negative COVID test at that time.  Patient states that she seemed to improve somewhat and then became sicker and went and saw [facility] ED at which time she tested positive for COVID.  This was 4 days ago.  Patient has continued with symptoms and feels like the cough is worsening at this time.  There is no chest pain, frank shortness of breath.  She had some nausea but no significant emesis or diarrhea.  Patient is unsure whether this is COVID or not given the fact that she had a negative test, then a positive test.  Patient is also concerned that there may be other infections as she is worsening with her cough.        Past Medical History:  Diagnosis Date  . Abdominal pain   . Anemia   . Arthritis    hips, legs, arms  . 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 Amplatzer closure in 2006  . Car sickness   . Chest wall pain 01/28/2016  . DDD (degenerative disc disease), cervical 01/12/2017  . 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 Headache    Migraines  . Internal hernia   . MI (myocardial infarction) ([place])   . Mitral valve prolapse   . Ovarian cyst   . Plantar fasciitis   . Sickle cell trait ([place])   . 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 Stroke [facility])    15 years ago, no deficits     Patient Active Problem List   Diagnosis Date Noted  . Chest pain 04/04/2018  . DDD (degenerative disc disease), cervical 01/12/2017  . 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 Internal hernia   . Abdominal pain   . 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 Chest wall pain 01/28/2016    Past Surgical History:  Procedure Laterality Date  . CARDIAC CATHETERIZATION     x3 last in 2014, device implanted to help MVP  . ENDOMETRIAL ABLATION  2014   [facility]  . EPIGASTRIC HERNIA REPAIR  01/04/2017   Procedure: HERNIA REPAIR;  Surgeon: Jules Husbands, MD;  Location: [facility] ORS;  Service: General;;  . fibroid cyst    . LAPAROTOMY N/A 01/04/2017   Procedure: EXPLORATORY LAPAROTOMY;  Surgeon: Jules Husbands, MD;  Location: [facility] ORS;  Service: General;  Laterality: N/A;  . LYSIS OF ADHESION  01/04/2017   Procedure: LYSIS OF ADHESION;  Surgeon: Jules Husbands, MD;  Location: [facility] ORS;  Service: General;;    Prior to Admission medications   Medication Sig Start Date End Date Taking? Authorizing Provider  azithromycin (ZITHROMAX Z-PAK) 250 MG tablet Take 2 tablets (500 mg) on  Day 1,  followed by 1 tablet (250 mg) once daily on Days 2 through 5. 06/18/20  Yes Haydyn Girvan, Charline Bills,  PA-C  brompheniramine-pseudoephedrine-DM 30-2-10 MG/5ML syrup Take 10 mLs by mouth 4 (four) times daily as needed. 06/18/20  Yes Brittain Smithey, Delorise Royals, PA-C  ondansetron (ZOFRAN-ODT) 4 MG disintegrating tablet Take 1 tablet (4 mg total) by mouth every 8 (eight) hours as needed for nausea or vomiting. 06/18/20  Yes Yousif Edelson, Delorise Royals, PA-C  predniSONE (DELTASONE) 50 MG tablet Take 1 tablet (50 mg total) by mouth daily with breakfast. 06/18/20  Yes Kayanna Mckillop, Delorise Royals, PA-C  albuterol (PROAIR HFA) 108 (90 Base) MCG/ACT inhaler Inhale 2 puffs into the lungs as needed.    [provider]  chlorthalidone (HYGROTON) 25 MG tablet Take 1 tablet (25 mg total) by mouth daily. 04/06/18    Katha Hamming, MD  cyclobenzaprine (FLEXERIL) 5 MG tablet Take 1 tablet (5 mg total) by mouth 3 (three) times daily as needed for muscle spasms. 05/21/18   Schaevitz, Myra Rude, MD  diclofenac sodium (VOLTAREN) 1 % GEL Apply to affected area up to 4 times a day as needed for pain. 05/21/18   Schaevitz, Myra Rude, MD  guaiFENesin-codeine 100-10 MG/5ML syrup Take 5 mLs by mouth every 6 (six) hours as needed for cough. 07/01/18   Minna Antis, MD  oseltamivir (TAMIFLU) 75 MG capsule Take 1 capsule (75 mg total) by mouth 2 (two) times daily. 06/25/18   Irean Hong, MD  polyethylene glycol [facility] / Ethelene Hal) packet Take 17 g by mouth every other day.    [provider]    Allergies No known allergies  Family History  Problem Relation Age of Onset  . Lung cancer Mother   . Leukemia Father   . Sickle cell anemia Brother   . Colon cancer Maternal Grandmother   . Breast cancer Maternal Aunt   . Sickle cell anemia Daughter     Social History Social History   Tobacco Use  . Smoking status: Never Smoker  . Smokeless tobacco: Never Used  Vaping Use  . Vaping Use: Never used  Substance Use Topics  . Alcohol use: No  . Drug use: No     Review of Systems  Constitutional: Positive fever/chills Eyes: No visual changes. No discharge ENT: Positive for nasal congestion Cardiovascular: no chest pain. Respiratory: Positive cough. No SOB. Gastrointestinal: No abdominal pain.  No nausea, no vomiting.  No diarrhea.  No constipation. Musculoskeletal: Negative for musculoskeletal pain. Skin: Negative for rash, abrasions, lacerations, ecchymosis. Neurological: Positive for headache but denies focal weakness or numbness.  10 System ROS otherwise negative.  ____________________________________________   PHYSICAL EXAM:  VITAL SIGNS: ED Triage Vitals  Enc Vitals Group     BP 06/18/20 1856 (!) 147/61     Pulse Rate 06/18/20 1856 66     Resp 06/18/20 1856 18      Temp 06/18/20 1856 98.7 F (37.1 C)     Temp src --      SpO2 06/18/20 1856 100 %     Weight --      Height --      Head Circumference --      Peak Flow --      Pain Score 06/18/20 1854 5     Pain Loc --      Pain Edu? --      Excl. in [place]? --      Constitutional: Alert and oriented. Well appearing and in no acute distress. Eyes: Conjunctivae are normal. PERRL. EOMI. Head: Atraumatic. ENT:      Ears: EACs and TMs unremarkable bilaterally  Nose: No congestion/rhinnorhea.      Mouth/Throat: Mucous membranes are moist.  Neck: No stridor.  Neck is supple full range of motion Hematological/Lymphatic/Immunilogical: No cervical lymphadenopathy. Cardiovascular: Normal rate, regular rhythm. Normal S1 and S2.  Good peripheral circulation. Respiratory: Normal respiratory effort without tachypnea or retractions. Lungs CTAB. Good air entry to the bases with no decreased or absent breath sounds. Musculoskeletal: Full range of motion to all extremities. No gross deformities appreciated. Neurologic:  Normal speech and language. No gross focal neurologic deficits are appreciated.  Skin:  Skin is warm, dry and intact. No rash noted. Psychiatric: Mood and affect are normal. Speech and behavior are normal. Patient exhibits appropriate insight and judgement.   ____________________________________________   LABS (all labs ordered are listed, but only abnormal results are displayed)  Labs Reviewed  POC SARS CORONAVIRUS 2 AG -  ED - Normal   ____________________________________________  EKG   ____________________________________________  RADIOLOGY I personally viewed and evaluated these images as part of my medical decision making, as well as reviewing the written report by the radiologist.  ED Provider Interpretation: No consolidation identified.  No other acute cardiopulmonary abnormality.  DG Chest 2 View  Result Date: 06/18/2020 CLINICAL DATA:  Cough EXAM: CHEST - 2 VIEW  COMPARISON:  July 01, 2018 FINDINGS: The heart size and mediastinal contours are within normal limits. Both lungs are clear. The visualized skeletal structures are unremarkable. IMPRESSION: No active cardiopulmonary disease. Electronically Signed   By: Constance Holster M.D.   On: 06/18/2020 20:56    ____________________________________________    PROCEDURES  Procedure(s) performed:    Procedures    Medications  azithromycin (ZITHROMAX) tablet 500 mg (500 mg Oral Given 06/18/20 2343)     ____________________________________________   INITIAL IMPRESSION / ASSESSMENT AND PLAN / ED COURSE  Pertinent labs & imaging results that were available during my care of the patient were reviewed by me and considered in my medical decision making (see chart for details).  Review of the [place] CSRS was performed in accordance of the [place] prior to dispensing any controlled drugs.           Patient's diagnosis is consistent with respiratory illness.  Patient presented to the emergency department with ongoing respiratory symptoms almost 3 weeks after initial start.  Patient did have a time period that she seemed to improve and then worsen.  Patient tested positive for COVID at [facility] 4 days ago, rapid test was negative today.  Patient is still symptomatic and reports primary concerning symptom of worsening cough.  Chest x-ray revealed no consolidation.  Given the 3-week history of symptoms, I will cover her with azithromycin to ensure no walking/community-acquired pneumonia though I do feel that symptoms are likely COVID.  Even with her negative test which was a rapid test I feel that symptoms are more consistent with COVID especially with multiple recent sick contacts.  Will prescribe additional symptom control medication for the patient.  I discussed at home care to include increase fluids, Tylenol Motrin for any fevers, use of symptom control medicines that I have prescribed.  Return precautions are  discussed with the patient.  Otherwise follow-up with primary care as needed. Patient is given ED precautions to return to the ED for any worsening or new symptoms.     ____________________________________________  FINAL CLINICAL IMPRESSION(S) / ED DIAGNOSES  Final diagnoses:  Respiratory infection      NEW MEDICATIONS STARTED DURING THIS VISIT:  ED Discharge Orders  Ordered    predniSONE (DELTASONE) 50 MG tablet  Daily with breakfast        06/18/20 2336    azithromycin (ZITHROMAX Z-PAK) 250 MG tablet        06/18/20 2336    brompheniramine-pseudoephedrine-DM 30-2-10 MG/5ML syrup  4 times daily PRN        06/18/20 2336    ondansetron (ZOFRAN-ODT) 4 MG disintegrating tablet  Every 8 hours PRN        06/18/20 2336              This chart was dictated using voice recognition software/Dragon. Despite best efforts to proofread, errors can occur which can change the meaning. Any change was purely unintentional.    Darletta Moll, PA-C 06/19/20 0017    Nance Pear, MD 06/24/20 2122

## 2020-07-04 ENCOUNTER — Encounter: Payer: Self-pay | Admitting: Emergency Medicine

## 2020-07-04 ENCOUNTER — Emergency Department: Payer: Medicaid Other

## 2020-07-04 ENCOUNTER — Other Ambulatory Visit: Payer: Self-pay

## 2020-07-04 ENCOUNTER — Emergency Department
Admission: EM | Admit: 2020-07-04 | Discharge: 2020-07-04 | Disposition: A | Discharge status: Home / Self Care | Payer: Medicaid Other | Attending: Emergency Medicine | Admitting: Emergency Medicine

## 2020-07-04 DIAGNOSIS — U071 COVID-19: Secondary | ICD-10-CM | POA: Insufficient documentation

## 2020-07-04 DIAGNOSIS — R1010 Upper abdominal pain, unspecified: Secondary | ICD-10-CM | POA: Insufficient documentation

## 2020-07-04 DIAGNOSIS — R079 Chest pain, unspecified: Secondary | ICD-10-CM | POA: Diagnosis present

## 2020-07-04 LAB — CBC WITH DIFFERENTIAL/PLATELET
Abs Immature Granulocytes: 0.01 10*3/uL (ref 0.00–0.07)
Basophils Absolute: 0 10*3/uL (ref 0.0–0.1)
Basophils Relative: 0 %
Eosinophils Absolute: 0.1 10*3/uL (ref 0.0–0.5)
Eosinophils Relative: 2 %
HCT: 36.5 % (ref 36.0–46.0)
Hemoglobin: 11.7 g/dL — ABNORMAL LOW (ref 12.0–15.0)
Immature Granulocytes: 0 %
Lymphocytes Relative: 31 %
Lymphs Abs: 1.5 10*3/uL (ref 0.7–4.0)
MCH: 23.6 pg — ABNORMAL LOW (ref 26.0–34.0)
MCHC: 32.1 g/dL (ref 30.0–36.0)
MCV: 73.6 fL — ABNORMAL LOW (ref 80.0–100.0)
Monocytes Absolute: 0.4 10*3/uL (ref 0.1–1.0)
Monocytes Relative: 9 %
Neutro Abs: 2.7 10*3/uL (ref 1.7–7.7)
Neutrophils Relative %: 58 %
Platelets: 219 10*3/uL (ref 150–400)
RBC: 4.96 MIL/uL (ref 3.87–5.11)
RDW: 14.8 % (ref 11.5–15.5)
WBC: 4.7 10*3/uL (ref 4.0–10.5)
nRBC: 0 % (ref 0.0–0.2)

## 2020-07-04 LAB — COMPREHENSIVE METABOLIC PANEL
ALT: 15 U/L (ref 0–44)
AST: 17 U/L (ref 15–41)
Albumin: 3.8 g/dL (ref 3.5–5.0)
Alkaline Phosphatase: 91 U/L (ref 38–126)
Anion gap: 9 (ref 5–15)
BUN: 15 mg/dL (ref 6–20)
CO2: 24 mmol/L (ref 22–32)
Calcium: 9 mg/dL (ref 8.9–10.3)
Chloride: 105 mmol/L (ref 98–111)
Creatinine, Ser: 0.78 mg/dL (ref 0.44–1.00)
GFR, Estimated: >60 mL/min (ref >60)
Glucose, Bld: 161 mg/dL — ABNORMAL HIGH (ref 70–99)
Potassium: 3.9 mmol/L (ref 3.5–5.1)
Sodium: 138 mmol/L (ref 135–145)
Total Bilirubin: 0.4 mg/dL (ref 0.3–1.2)
Total Protein: 7 g/dL (ref 6.5–8.1)

## 2020-07-04 LAB — TROPONIN I (HIGH SENSITIVITY)
Troponin I (High Sensitivity): 4 ng/L (ref <18)
Troponin I (High Sensitivity): 4 ng/L (ref <18)

## 2020-07-04 LAB — LIPASE, BLOOD: Lipase: 46 U/L (ref 11–51)

## 2020-07-04 MED ORDER — KETOROLAC TROMETHAMINE 30 MG/ML IJ SOLN
15.0000 mg | Freq: Once | INTRAMUSCULAR | Status: AC
  Administered 2020-07-04: 15 mg via INTRAVENOUS
  Filled 2020-07-04: qty 1

## 2020-07-04 MED ORDER — IOHEXOL 350 MG/ML SOLN
75.0000 mL | Freq: Once | INTRAVENOUS | Status: AC | PRN
  Administered 2020-07-04: 75 mL via INTRAVENOUS
  Filled 2020-07-04: qty 75

## 2020-07-04 MED ORDER — IBUPROFEN 600 MG PO TABS: 600.0000 mg | ORAL_TABLET | Freq: Four times a day (QID) | ORAL | 0 refills | Status: AC | PRN

## 2020-07-04 NOTE — ED Triage Notes (Addendum)
Pt to triage via w/c with no distress noted; c/o bilat lateral lower ribcage pain and SHOB with HA x 2 days; recent pneumonia and COVID 1/11; completed prednisone and antibiotics but symptoms persist

## 2020-07-04 NOTE — ED Notes (Signed)
Says pain on sides of ribs bilateral.  Had covid on 1/11 and was back to work and then was told she had "walking" pneumonia.  No resp distress.  She appears to have pain with movement.

## 2020-07-04 NOTE — ED Provider Notes (Signed)
[facility] Emergency Department Provider Note  ____________________________________________   Event Date/Time   First MD Initiated Contact with Patient 07/04/20 [phone]     (approximate)  I have reviewed the triage vital signs    HISTORY  Chief Complaint Chest Pain    HPI Pam Snyder is a 55 y.o. female who has a history of MI, stroke, sigmoid volvulus presents with viral symptoms. Pt reports multiple symptoms including headaches, shortness of breath, lateral lower rib cage pain.  Patient was Covid positive on 1/11.  However she states that she started to feel symptomatic around 25 December.  Patient completed a course of antibiotics and steroids but unfortunately her symptoms are consistent.  Patient also reports abdominal pain.  On review of records patient was given azithromycin on 1/11.  Patient states that the pain is moderate, constant, worse with breathing, better at rest underneath her bilateral breasts.     Past Medical History:  Diagnosis Date  . Abdominal pain   . Anemia   . Arthritis    hips, legs, arms  . 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 Amplatzer closure in 2006  . Car sickness   . Chest wall pain 01/28/2016  . DDD (degenerative disc disease), cervical 01/12/2017  . 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 Headache    Migraines  . Internal hernia   . MI (myocardial infarction) ([place])   . Mitral valve prolapse   . Ovarian cyst   . Plantar fasciitis   . Sickle cell trait ([place])   . 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 Stroke [facility])    15 years ago, no deficits    Patient Active Problem List   Diagnosis Date Noted  . Chest pain 04/04/2018  . DDD (degenerative disc disease), cervical 01/12/2017  . 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 Internal hernia   . Abdominal pain   . 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 Chest wall pain 01/28/2016    Past Surgical  History:  Procedure Laterality Date  . CARDIAC CATHETERIZATION     x3 last in 2014, device implanted to help MVP  . ENDOMETRIAL ABLATION  2014   [facility]  . EPIGASTRIC HERNIA REPAIR  01/04/2017   Procedure: HERNIA REPAIR;  Surgeon: Jules Husbands, MD;  Location: [facility] ORS;  Service: General;;  . fibroid cyst    . LAPAROTOMY N/A 01/04/2017   Procedure: EXPLORATORY LAPAROTOMY;  Surgeon: Jules Husbands, MD;  Location: [facility] ORS;  Service: General;  Laterality: N/A;  . LYSIS OF ADHESION  01/04/2017   Procedure: LYSIS OF ADHESION;  Surgeon: Jules Husbands, MD;  Location: [facility] ORS;  Service: General;;    Prior to Admission medications   Medication Sig Start Date End Date Taking? Authorizing Provider  albuterol (PROAIR HFA) 108 (90 Base) MCG/ACT inhaler Inhale 2 puffs into the lungs as needed.    [provider]  azithromycin (ZITHROMAX Z-PAK) 250 MG tablet Take 2 tablets (500 mg) on  Day 1,  followed by 1 tablet (250 mg) once daily on Days 2 through 5. 06/18/20   Cuthriell, Charline Bills, PA-C  brompheniramine-pseudoephedrine-DM 30-2-10 MG/5ML syrup Take 10 mLs by mouth 4 (four) times daily as needed. 06/18/20   Cuthriell, Charline Bills, PA-C  chlorthalidone (HYGROTON) 25 MG tablet Take 1 tablet (25 mg total) by mouth daily. 04/06/18   Epifanio Lesches, MD  cyclobenzaprine (FLEXERIL) 5 MG tablet Take 1 tablet (5 mg total) by  mouth 3 (three) times daily as needed for muscle spasms. 05/21/18   Schaevitz, Randall An, MD  diclofenac sodium (VOLTAREN) 1 % GEL Apply to affected area up to 4 times a day as needed for pain. 05/21/18   Schaevitz, Randall An, MD  guaiFENesin-codeine 100-10 MG/5ML syrup Take 5 mLs by mouth every 6 (six) hours as needed for cough. 07/01/18   Harvest Dark, MD  ondansetron (ZOFRAN-ODT) 4 MG disintegrating tablet Take 1 tablet (4 mg total) by mouth every 8 (eight) hours as needed for nausea or vomiting. 06/18/20   Cuthriell, Charline Bills, PA-C  oseltamivir (TAMIFLU) 75 MG  capsule Take 1 capsule (75 mg total) by mouth 2 (two) times daily. 06/25/18   Paulette Blanch, MD  polyethylene glycol [facility] Floria Raveling) packet Take 17 g by mouth every other day.    [provider]  predniSONE (DELTASONE) 50 MG tablet Take 1 tablet (50 mg total) by mouth daily with breakfast. 06/18/20   Cuthriell, Charline Bills, PA-C    Allergies No known allergies  Family History  Problem Relation Age of Onset  . Lung cancer Mother   . Leukemia Father   . Sickle cell anemia Brother   . Colon cancer Maternal Grandmother   . Breast cancer Maternal Aunt   . Sickle cell anemia Daughter     Social History Social History   Tobacco Use  . Smoking status: Never Smoker  . Smokeless tobacco: Never Used  Vaping Use  . Vaping Use: Never used  Substance Use Topics  . Alcohol use: No  . Drug use: No      Review of Systems Constitutional: No fever/chills Eyes: No visual changes. ENT: No sore throat. Cardiovascular: Positive chest pain Respiratory: Positive shortness of breath Gastrointestinal: + abd pain No nausea, no vomiting.  No diarrhea.  No constipation. Genitourinary: Negative for dysuria. Musculoskeletal: Negative for back pain. Skin: Negative for rash. Neurological: Positive headache, no focal weakness or numbness. All other ROS negative ____________________________________________   PHYSICAL EXAM:  VITAL SIGNS: ED Triage Vitals  Enc Vitals Group     BP 07/04/20 0633 (!) 129/59     Pulse Rate 07/04/20 0633 65     Resp 07/04/20 0633 18     Temp 07/04/20 0633 99.3 F (37.4 C)     Temp Source 07/04/20 0633 Oral     SpO2 07/04/20 0633 97 %     Weight 07/04/20 0633 208 lb (94.3 kg)     Height 07/04/20 0633 5' 8.5" (1.74 m)     Head Circumference --      Peak Flow --      Pain Score 07/04/20 0639 8     Pain Loc --      Pain Edu? --      Excl. in [employer]? --     Constitutional: Alert and oriented. Well appearing and in no acute distress. Eyes: Conjunctivae  are normal. EOMI. Head: Atraumatic. Nose: No congestion/rhinnorhea. Mouth/Throat: Mucous membranes are moist.   Neck: No stridor. Trachea Midline. FROM Cardiovascular: Normal rate, regular rhythm. Good peripheral circulation.  Tenderness under her bilateral breasts. Respiratory: no audible stridor, no increased work of breathing  Gastrointestinal: Tenderness in the upper abdomen.  No distention.  Musculoskeletal: No lower extremity tenderness nor edema.  No joint effusions. Neurologic:  Normal speech and language. No gross focal neurologic deficits are appreciated.  Skin:  Skin is warm, dry and intact. No rash noted. Psychiatric: Mood and affect are normal. Speech and behavior are  normal. GU: Deferred   ____________________________________________   LABS (all labs ordered are listed, but only abnormal results are displayed)  Labs Reviewed  CBC WITH DIFFERENTIAL/PLATELET - Abnormal; Notable for the following components:      Result Value   Hemoglobin 11.7 (*)    MCV 73.6 (*)    MCH 23.6 (*)    All other components within normal limits  COMPREHENSIVE METABOLIC PANEL - Abnormal; Notable for the following components:   Glucose, Bld 161 (*)    All other components within normal limits  LIPASE, BLOOD  TROPONIN I (HIGH SENSITIVITY)  TROPONIN I (HIGH SENSITIVITY)   ____________________________________________   RADIOLOGY Robert Bellow, personally viewed and evaluated these images (plain radiographs) as part of my medical decision making, as well as reviewing the written report by the radiologist.  ED MD interpretation:  No pna   Official radiology report(s): DG Chest 2 View  Result Date: 07/04/2020 CLINICAL DATA:  55 year old female with bilateral lower rib pain and shortness of breath plus headache for 2 days. Recent COVID-19. EXAM: CHEST - 2 VIEW COMPARISON:  Chest radiographs 06/18/2020 and earlier. FINDINGS: Stable cardiomegaly and mediastinal contours. Interatrial occluding  device remains in place. Visualized tracheal air column is within normal limits. No pneumothorax, pulmonary edema, pleural effusion or consolidation. Subtle hazy asymmetric lung base opacity at the right base on the PA view appears chronic and stable since 2020 - likely postinflammatory. No acute pulmonary opacity. No acute osseous abnormality identified. Negative visible bowel gas pattern. IMPRESSION: Stable.  No acute cardiopulmonary abnormality. Electronically Signed   By: Genevie Ann M.D.   On: 07/04/2020 07:18   CT Angio Chest PE W and/or Wo Contrast  Result Date: 07/04/2020 CLINICAL DATA:  55 year old female with bilateral lower rib pain, shortness of breath and headache for 2 days. Positive for COVID-19. 06/18/2020. Completed prednisone and antibiotics. Abdominal pain and distension. EXAM: CT ANGIOGRAPHY CHEST CT ABDOMEN AND PELVIS WITH CONTRAST TECHNIQUE: Multidetector CT imaging of the chest was performed using the standard protocol during bolus administration of intravenous contrast. Multiplanar CT image reconstructions and MIPs were obtained to evaluate the vascular anatomy. Multidetector CT imaging of the abdomen and pelvis was performed using the standard protocol during bolus administration of intravenous contrast. CONTRAST:  71mL OMNIPAQUE IOHEXOL 350 MG/ML SOLN COMPARISON:  Chest radiographs earlier today. Chest CTA 07/01/2018. CT Abdomen and Pelvis 01/03/2017. FINDINGS: CTA CHEST FINDINGS Cardiovascular: Good contrast bolus timing in the pulmonary arterial tree. No focal filling defect identified in the pulmonary arteries to suggest acute pulmonary embolism. Calcified coronary artery atherosclerosis (series 3, image 145). Interatrial occluded device. Cardiac size at the upper limits of normal. No pericardial effusion. 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 Little contrast in the aorta today which appears grossly normal. Mediastinum/Nodes: Negative.  No lymphadenopathy.  Lungs/Pleura: Major airways are patent. Lung volumes appear normal. Both lungs are clear. No pleural effusion. Musculoskeletal: Mild dextroconvex thoracic scoliosis is chronic. No acute osseous abnormality identified. Review of the MIP images confirms the above findings. CT ABDOMEN and PELVIS FINDINGS Hepatobiliary: Hepatic steatosis is more apparent than in 2018. A small circumscribed 6 mm low-density area in the posterior right hepatic lobe on series 5, image 40 was present in 2018 and is likely a benign cyst. Negative gallbladder. No bile duct enlargement. Pancreas: Negative. Spleen: Negative. Adrenals/Urinary Tract: Normal adrenal glands. The kidneys appears stable and within normal limits, left column of Bertin suspected (normal variant). Small medial left midpole cysts are stable  since 2018 (series 10, image 13). Symmetric renal enhancement and contrast excretion. Decompressed proximal ureters. Negative urinary bladder. Chronic pelvic phleboliths. Stomach/Bowel: Redundant sigmoid colon which tracks into the left upper quadrant, otherwise negative. Largely decompressed left colon. Retained stool more so in the transverse colon and at the hepatic flexure. Negative right colon and appendix (series 5, image 73). Negative terminal ileum. No dilated small bowel. Decompressed stomach. Duodenum is within normal limits. No free air, free fluid, mesenteric stranding. Vascular/Lymphatic: Major arterial structures in the abdomen and pelvis appear patent. Minimal iliac artery calcified atherosclerosis. Patent portal venous system. No lymphadenopathy. Reproductive: Mildly enlarged uterus but regression of multiple lobulated uterine fibroid since 2018. Ovaries within normal limits. Other: No pelvic free fluid. Musculoskeletal: Negative. Review of the MIP images confirms the above findings. IMPRESSION: 1. Negative for acute pulmonary embolus. 2. No acute or inflammatory process identified in the chest, abdomen, or pelvis. 3.  Calcified coronary artery atherosclerosis. Cardiac interatrial occluded device. 4. Regressed fibroid uterus since 2018. 5. Hepatic steatosis. Electronically Signed   By: Genevie Ann M.D.   On: 07/04/2020 09:59   CT ABDOMEN PELVIS W CONTRAST  Result Date: 07/04/2020 CLINICAL DATA:  55 year old female with bilateral lower rib pain, shortness of breath and headache for 2 days. Positive for COVID-19. 06/18/2020. Completed prednisone and antibiotics. Abdominal pain and distension. EXAM: CT ANGIOGRAPHY CHEST CT ABDOMEN AND PELVIS WITH CONTRAST TECHNIQUE: Multidetector CT imaging of the chest was performed using the standard protocol during bolus administration of intravenous contrast. Multiplanar CT image reconstructions and MIPs were obtained to evaluate the vascular anatomy. Multidetector CT imaging of the abdomen and pelvis was performed using the standard protocol during bolus administration of intravenous contrast. CONTRAST:  50mL OMNIPAQUE IOHEXOL 350 MG/ML SOLN COMPARISON:  Chest radiographs earlier today. Chest CTA 07/01/2018. CT Abdomen and Pelvis 01/03/2017. FINDINGS: CTA CHEST FINDINGS Cardiovascular: Good contrast bolus timing in the pulmonary arterial tree. No focal filling defect identified in the pulmonary arteries to suggest acute pulmonary embolism. Calcified coronary artery atherosclerosis (series 3, image 145). Interatrial occluded device. Cardiac size at the upper limits of normal. No pericardial effusion. 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 Little contrast in the aorta today which appears grossly normal. Mediastinum/Nodes: Negative.  No lymphadenopathy. Lungs/Pleura: Major airways are patent. Lung volumes appear normal. Both lungs are clear. No pleural effusion. Musculoskeletal: Mild dextroconvex thoracic scoliosis is chronic. No acute osseous abnormality identified. Review of the MIP images confirms the above findings. CT ABDOMEN and PELVIS FINDINGS Hepatobiliary: Hepatic  steatosis is more apparent than in 2018. A small circumscribed 6 mm low-density area in the posterior right hepatic lobe on series 5, image 40 was present in 2018 and is likely a benign cyst. Negative gallbladder. No bile duct enlargement. Pancreas: Negative. Spleen: Negative. Adrenals/Urinary Tract: Normal adrenal glands. The kidneys appears stable and within normal limits, left column of Bertin suspected (normal variant). Small medial left midpole cysts are stable since 2018 (series 10, image 13). Symmetric renal enhancement and contrast excretion. Decompressed proximal ureters. Negative urinary bladder. Chronic pelvic phleboliths. Stomach/Bowel: Redundant sigmoid colon which tracks into the left upper quadrant, otherwise negative. Largely decompressed left colon. Retained stool more so in the transverse colon and at the hepatic flexure. Negative right colon and appendix (series 5, image 73). Negative terminal ileum. No dilated small bowel. Decompressed stomach. Duodenum is within normal limits. No free air, free fluid, mesenteric stranding. Vascular/Lymphatic: Major arterial structures in the abdomen and pelvis appear patent. Minimal iliac artery  calcified atherosclerosis. Patent portal venous system. No lymphadenopathy. Reproductive: Mildly enlarged uterus but regression of multiple lobulated uterine fibroid since 2018. Ovaries within normal limits. Other: No pelvic free fluid. Musculoskeletal: Negative. Review of the MIP images confirms the above findings. IMPRESSION: 1. Negative for acute pulmonary embolus. 2. No acute or inflammatory process identified in the chest, abdomen, or pelvis. 3. Calcified coronary artery atherosclerosis. Cardiac interatrial occluded device. 4. Regressed fibroid uterus since 2018. 5. Hepatic steatosis. Electronically Signed   By: Genevie Ann M.D.   On: 07/04/2020 09:59    ____________________________________________   PROCEDURES  Procedure(s) performed (including Critical  Care):  Procedures   ____________________________________________   INITIAL IMPRESSION / ASSESSMENT AND PLAN / ED COURSE  Deena Shippen was evaluated in Emergency Department on 07/04/2020 for the symptoms described in the history of present illness. She was evaluated in the context of the global COVID-19 pandemic, which necessitated consideration that the patient might be at risk for infection with the SARS-CoV-2 virus that causes COVID-19. Institutional protocols and algorithms that pertain to the evaluation of patients at risk for COVID-19 are in a state of rapid change based on information released by regulatory bodies including the CDC and federal and state organizations. These policies and algorithms were followed during the patient's care in the ED.     Pt presents with multiple symptoms and is now post COVID greater than 2 weeks.  Will get cardiac markers to evaluate for ACS and EKG.  Will get CT PE to evaluate for pneumonia being missed on chest x-ray versus pulmonary embolism.  Patient is also significantly tender in her upper abdomen consider gallbladder pathology pancreatitis will get labs and CT scan to further allow evaluate.  She is very well-appearing low suspicion for meningitis or sepsis.  She does not meet criteria for SIRS.  EKG my interpretation is normal sinus rate of 67, no ST elevation but does have T wave inversion in 2 3 aVF V4 through V6, normal intervals.  On review of prior EKG she had similar T wave inversions in 2020 when she had a CT scan that showed pneumonia.  Cardiac markers negative x2   LFTS/lipase normal  CT scans are negative.  Patient had a procedure done for an atrial hole therefore has a device that the CT noted.  Reevaluated patient she was asleep at bedside.  Upon waking we discussed the CT scans.  We discussed continue symptomatic treatment with ibuprofen and Tylenol.  Will provide 2 more days for her work.  Suspect that she still having sequela of  her COVID-19.     ____________________________________________   FINAL CLINICAL IMPRESSION(S) / ED DIAGNOSES   Final diagnoses:  Chest pain, unspecified type  COVID-19      MEDICATIONS GIVEN DURING THIS VISIT:  Medications  ketorolac (TORADOL) 30 MG/ML injection 15 mg (15 mg Intravenous Given 07/04/20 0930)  iohexol (OMNIPAQUE) 350 MG/ML injection 75 mL (75 mLs Intravenous Contrast Given 07/04/20 0934)     ED Discharge Orders         Ordered    ibuprofen (ADVIL) 600 MG tablet  Every 6 hours PRN        07/04/20 1036           Note:  This document was prepared using Dragon voice recognition software and may include unintentional dictation errors.   Vanessa [place], MD 07/04/20 1038

## 2020-07-04 NOTE — Discharge Instructions (Signed)
I suspect that some of your symptoms are still from your COVID-19 and your recovery.  Take ibuprofen that I prescribed with Tylenol 1 g every 8 hours.  Take the ibuprofen with food.  This should help with some of the chest discomfort most likely from all the coughing.  Return to the ER if you develop worsening shortness of breath.  Your CT scans of your chest abdomen and pelvis were all negative for acute findings.

## 2020-07-12 ENCOUNTER — Other Ambulatory Visit: Payer: Self-pay

## 2020-07-15 ENCOUNTER — Other Ambulatory Visit: Payer: Self-pay

## 2020-07-15 ENCOUNTER — Ambulatory Visit (LOCAL_COMMUNITY_HEALTH_CENTER): Payer: Medicaid Other

## 2020-07-15 DIAGNOSIS — Z111 Encounter for screening for respiratory tuberculosis: Secondary | ICD-10-CM

## 2020-07-18 ENCOUNTER — Other Ambulatory Visit: Payer: Self-pay

## 2020-07-18 ENCOUNTER — Ambulatory Visit (LOCAL_COMMUNITY_HEALTH_CENTER): Payer: Medicaid Other

## 2020-07-18 DIAGNOSIS — Z111 Encounter for screening for respiratory tuberculosis: Secondary | ICD-10-CM

## 2020-07-18 LAB — TB SKIN TEST
Induration: 0 mm
TB Skin Test: NEGATIVE

## 2020-10-15 ENCOUNTER — Emergency Department
Admission: EM | Admit: 2020-10-15 | Discharge: 2020-10-15 | Disposition: A | Discharge status: Home / Self Care | Payer: Medicaid Other | Attending: Emergency Medicine | Admitting: Emergency Medicine

## 2020-10-15 ENCOUNTER — Encounter: Payer: Self-pay | Admitting: Emergency Medicine

## 2020-10-15 ENCOUNTER — Emergency Department: Payer: Medicaid Other

## 2020-10-15 ENCOUNTER — Other Ambulatory Visit: Payer: Self-pay

## 2020-10-15 DIAGNOSIS — M79671 Pain in right foot: Secondary | ICD-10-CM | POA: Insufficient documentation

## 2020-10-15 MED ORDER — KETOROLAC TROMETHAMINE 10 MG PO TABS: 10.0000 mg | ORAL_TABLET | Freq: Four times a day (QID) | ORAL | 0 refills | Status: AC | PRN

## 2020-10-15 MED ORDER — KETOROLAC TROMETHAMINE 60 MG/2ML IM SOLN
30.0000 mg | Freq: Once | INTRAMUSCULAR | Status: AC
  Administered 2020-10-15: 30 mg via INTRAMUSCULAR
  Filled 2020-10-15: qty 2

## 2020-10-15 MED ORDER — CEPHALEXIN 500 MG PO CAPS: 500.0000 mg | ORAL_CAPSULE | Freq: Four times a day (QID) | ORAL | 0 refills | Status: AC

## 2020-10-15 MED ORDER — CEPHALEXIN 500 MG PO CAPS
1000.0000 mg | ORAL_CAPSULE | Freq: Once | ORAL | Status: AC
  Administered 2020-10-15: 1000 mg via ORAL
  Filled 2020-10-15: qty 2

## 2020-10-15 NOTE — ED Triage Notes (Signed)
Pt c/o right anterior foot pain x2 days. No known injury to area. Swelling and redness noted to area. Pt able to ambulate but not distribute complete weight on right foot.

## 2020-10-15 NOTE — Discharge Instructions (Addendum)
Please take your anti-inflammatories for no more than 5 days.  Please also take the antibiotics as prescribed.  Follow-up with podiatry for your pain.

## 2020-10-16 NOTE — ED Provider Notes (Signed)
[facility] Emergency Department Provider Note  ____________________________________________   Event Date/Time   First MD Initiated Contact with Patient 10/15/20 2025     (approximate)  I have reviewed the triage vital signs and the nursing notes.   HISTORY  Chief Complaint Foot Pain   HPI Pam Snyder is a 55 y.o. female who presents to the emergency department for evaluation of right foot pain and swelling for the last 2 days.  She states that it began without any known fall, trauma or injury.  She denies history of similar, denies history of gout.  Does not report any recent abrasions or open wounds.  Reports she is a diabetic, recently diagnosed and is unsure if her blood pressure is well controlled at this time.       Past Medical History:  Diagnosis Date  . Abdominal pain   . Anemia   . Arthritis    hips, legs, arms  . 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 Amplatzer closure in 2006  . Car sickness   . Chest wall pain 01/28/2016  . DDD (degenerative disc disease), cervical 01/12/2017  . 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 Headache    Migraines  . Internal hernia   . MI (myocardial infarction) ([place])   . Mitral valve prolapse   . Ovarian cyst   . Plantar fasciitis   . Sickle cell trait ([place])   . 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 Stroke [facility])    15 years ago, no deficits    Patient Active Problem List   Diagnosis Date Noted  . Chest pain 04/04/2018  . DDD (degenerative disc disease), cervical 01/12/2017  . 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 Internal hernia   . Abdominal pain   . 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 Chest wall pain 01/28/2016    Past Surgical History:  Procedure Laterality Date  . CARDIAC CATHETERIZATION     x3 last in 2014, device implanted to help MVP  . ENDOMETRIAL ABLATION  2014   [facility]  . EPIGASTRIC HERNIA REPAIR  01/04/2017    Procedure: HERNIA REPAIR;  Surgeon: Jules Husbands, MD;  Location: [facility] ORS;  Service: General;;  . fibroid cyst    . LAPAROTOMY N/A 01/04/2017   Procedure: EXPLORATORY LAPAROTOMY;  Surgeon: Jules Husbands, MD;  Location: [facility] ORS;  Service: General;  Laterality: N/A;  . LYSIS OF ADHESION  01/04/2017   Procedure: LYSIS OF ADHESION;  Surgeon: Jules Husbands, MD;  Location: [facility] ORS;  Service: General;;    Prior to Admission medications   Medication Sig Start Date End Date Taking? Authorizing Provider  cephALEXin (KEFLEX) 500 MG capsule Take 1 capsule (500 mg total) by mouth 4 (four) times daily for 7 days. 10/15/20 10/22/20 Yes Rob Mciver, Farrel Gordon, PA  ketorolac (TORADOL) 10 MG tablet Take 1 tablet (10 mg total) by mouth every 6 (six) hours as needed for up to 5 days. 10/15/20 10/20/20 Yes Saquoia Sianez, Farrel Gordon, PA  albuterol (PROAIR HFA) 108 (90 Base) MCG/ACT inhaler Inhale 2 puffs into the lungs as needed.    [provider]  azithromycin (ZITHROMAX Z-PAK) 250 MG tablet Take 2 tablets (500 mg) on  Day 1,  followed by 1 tablet (250 mg) once daily on Days 2 through 5. Patient not taking: Reported on 07/15/2020 06/18/20   Cuthriell, Charline Bills, PA-C  brompheniramine-pseudoephedrine-DM 30-2-10 MG/5ML syrup Take 10 mLs by mouth 4 (four)  times daily as needed. Patient not taking: Reported on 07/15/2020 06/18/20   Cuthriell, Charline Bills, PA-C  chlorthalidone (HYGROTON) 25 MG tablet Take 1 tablet (25 mg total) by mouth daily. Patient not taking: Reported on 07/15/2020 04/06/18   Epifanio Lesches, MD  cyclobenzaprine (FLEXERIL) 5 MG tablet Take 1 tablet (5 mg total) by mouth 3 (three) times daily as needed for muscle spasms. Patient not taking: Reported on 07/15/2020 05/21/18   Orbie Pyo, MD  diclofenac sodium (VOLTAREN) 1 % GEL Apply to affected area up to 4 times a day as needed for pain. Patient not taking: Reported on 07/15/2020 05/21/18   Orbie Pyo, MD  guaiFENesin-codeine  100-10 MG/5ML syrup Take 5 mLs by mouth every 6 (six) hours as needed for cough. Patient not taking: Reported on 07/15/2020 07/01/18   Harvest Dark, MD  ondansetron (ZOFRAN-ODT) 4 MG disintegrating tablet Take 1 tablet (4 mg total) by mouth every 8 (eight) hours as needed for nausea or vomiting. Patient not taking: Reported on 07/15/2020 06/18/20   Cuthriell, Charline Bills, PA-C  oseltamivir (TAMIFLU) 75 MG capsule Take 1 capsule (75 mg total) by mouth 2 (two) times daily. Patient not taking: Reported on 07/15/2020 06/25/18   Paulette Blanch, MD  polyethylene glycol [facility] Floria Raveling) packet Take 17 g by mouth every other day. Patient not taking: Reported on 07/15/2020    [provider]  predniSONE (DELTASONE) 50 MG tablet Take 1 tablet (50 mg total) by mouth daily with breakfast. Patient not taking: Reported on 07/15/2020 06/18/20   Cuthriell, Charline Bills, PA-C    Allergies No known allergies  Family History  Problem Relation Age of Onset  . Lung cancer Mother   . Leukemia Father   . Sickle cell anemia Brother   . Colon cancer Maternal Grandmother   . Breast cancer Maternal Aunt   . Sickle cell anemia Daughter     Social History Social History   Tobacco Use  . Smoking status: Never Smoker  . Smokeless tobacco: Never Used  Vaping Use  . Vaping Use: Never used  Substance Use Topics  . Alcohol use: No  . Drug use: No    Review of Systems Constitutional: No fever/chills Eyes: No visual changes. ENT: No sore throat. Cardiovascular: Denies chest pain. Respiratory: Denies shortness of breath. Gastrointestinal: No abdominal pain.  No nausea, no vomiting.  No diarrhea.  No constipation. Genitourinary: Negative for dysuria. Musculoskeletal: + Right foot pain, negative for back pain. Skin: Negative for rash. Neurological: Negative for headaches, focal weakness or numbness.   ____________________________________________   PHYSICAL EXAM:  VITAL SIGNS: ED Triage Vitals  Enc  Vitals Group     BP 10/15/20 1936 (!) 141/82     Pulse Rate 10/15/20 1936 79     Resp 10/15/20 1936 17     Temp 10/15/20 1936 98.2 F (36.8 C)     Temp Source 10/15/20 1936 Oral     SpO2 10/15/20 1936 99 %     Weight --      Height --      Head Circumference --      Peak Flow --      Pain Score 10/15/20 2105 10     Pain Loc --      Pain Edu? --      Excl. in [place]? --    Constitutional: Alert and oriented. Well appearing and in no acute distress. Eyes: Conjunctivae are normal. PERRL. EOMI. Head: Atraumatic. Nose: No congestion/rhinnorhea. Mouth/Throat:  Mucous membranes are moist.  Oropharynx non-erythematous. Neck: No stridor.   Cardiovascular: Normal rate, regular rhythm. Grossly normal heart sounds.  Good peripheral circulation. Respiratory: Normal respiratory effort.  No retractions. Lungs CTAB. Musculoskeletal: There is tenderness and erythema with mild soft tissue swelling noted over the dorsum of the right foot.  No obvious open wounds or lesions.  Able to move digits without difficulty.  No tenderness or erythema on the plantar surface of the foot.  Full ankle range of motion without difficulty.  No pretibial edema or erythema. Neurologic:  Normal speech and language. No gross focal neurologic deficits are appreciated. No gait instability. Skin:  Skin is warm, dry and intact.  Psychiatric: Mood and affect are normal. Speech and behavior are normal.   ____________________________________________  RADIOLOGY I, Marlana Salvage, personally viewed and evaluated these images (plain radiographs) as part of my medical decision making, as well as reviewing the written report by the radiologist.  ED provider interpretation: No acute fracture or other acute finding  Official radiology report(s): DG Foot Complete Right  Result Date: 10/15/2020 CLINICAL DATA:  Right foot pain EXAM: RIGHT FOOT COMPLETE - 3+ VIEW COMPARISON:  None. FINDINGS: There is no evidence of fracture or  dislocation. There is no evidence of arthropathy or other focal bone abnormality. Soft tissues are unremarkable. Small plantar calcaneal spur. IMPRESSION: Negative. Electronically Signed   By: Donavan Foil M.D.   On: 10/15/2020 20:38   ____________________________________________   INITIAL IMPRESSION / ASSESSMENT AND PLAN / ED COURSE  As part of my medical decision making, I reviewed the following data within the [facility] notes reviewed and incorporated, Radiograph reviewed and Notes from prior ED visits        Patient is a 55 year old female who presents to the emergency department for evaluation of 2 days of right foot erythema and swelling.  See HPI for further details.  In triage, patient is afebrile, mildly hypertensive otherwise with normal vital signs.  On physical exam there is erythema and swelling of the dorsum only of the foot, localized at the midfoot.  She is neurovascularly intact.  Differentials considered include cellulitis, osteoarthritis, gouty arthritis, fracture, soft tissue injury  X-rays were obtained and are negative for acute bony pathology.  Given patient's history of diabetes as well as sudden onset of erythema and swelling without any clear cause, will initiate Keflex with anti-inflammatories.  Recommend close follow-up with PCP.  Return precautions were discussed, patient stable time for outpatient management.      ____________________________________________   FINAL CLINICAL IMPRESSION(S) / ED DIAGNOSES  Final diagnoses:  Right foot pain     ED Discharge Orders         Ordered    ketorolac (TORADOL) 10 MG tablet  Every 6 hours PRN        10/15/20 2055    cephALEXin (KEFLEX) 500 MG capsule  4 times daily        10/15/20 2055          *Please note:  Pam Snyder was evaluated in Emergency Department on 10/16/2020 for the symptoms described in the history of present illness. She was evaluated in the context of the  global COVID-19 pandemic, which necessitated consideration that the patient might be at risk for infection with the SARS-CoV-2 virus that causes COVID-19. Institutional protocols and algorithms that pertain to the evaluation of patients at risk for COVID-19 are in a state of rapid change based on information released by regulatory  bodies including the CDC and federal and state organizations. These policies and algorithms were followed during the patient's care in the ED.  Some ED evaluations and interventions may be delayed as a result of limited staffing during and the pandemic.*   Note:  This document was prepared using Dragon voice recognition software and may include unintentional dictation errors.   Marlana Salvage, PA 10/16/20 [place]    Harvest Dark, MD 10/16/20 [phone]

## 2020-11-20 ENCOUNTER — Other Ambulatory Visit: Payer: Self-pay | Admitting: Family Medicine

## 2020-11-20 DIAGNOSIS — Z1231 Encounter for screening mammogram for malignant neoplasm of breast: Secondary | ICD-10-CM

## 2020-11-21 ENCOUNTER — Other Ambulatory Visit: Payer: Self-pay | Admitting: Family Medicine

## 2020-11-21 DIAGNOSIS — N6320 Unspecified lump in the left breast, unspecified quadrant: Secondary | ICD-10-CM

## 2020-11-22 ENCOUNTER — Ambulatory Visit
Admission: RE | Admit: 2020-11-22 | Discharge: 2020-11-22 | Disposition: A | Discharge status: Home / Self Care | Payer: Medicaid Other | Source: Ambulatory Visit | Attending: Family Medicine | Admitting: Family Medicine

## 2020-11-22 ENCOUNTER — Other Ambulatory Visit: Payer: Self-pay

## 2020-11-22 DIAGNOSIS — N6320 Unspecified lump in the left breast, unspecified quadrant: Secondary | ICD-10-CM

## 2020-11-25 ENCOUNTER — Other Ambulatory Visit: Payer: Medicaid Other

## 2021-01-08 ENCOUNTER — Ambulatory Visit: Payer: Medicaid Other

## 2021-01-16 ENCOUNTER — Ambulatory Visit: Payer: Medicaid Other

## 2021-01-24 ENCOUNTER — Emergency Department: Payer: Medicaid Other

## 2021-01-24 ENCOUNTER — Other Ambulatory Visit: Payer: Self-pay

## 2021-01-24 ENCOUNTER — Emergency Department
Admission: EM | Admit: 2021-01-24 | Discharge: 2021-01-24 | Disposition: A | Discharge status: Home / Self Care | Payer: Medicaid Other | Attending: Emergency Medicine | Admitting: Emergency Medicine

## 2021-01-24 DIAGNOSIS — I10 Essential (primary) hypertension: Secondary | ICD-10-CM | POA: Insufficient documentation

## 2021-01-24 DIAGNOSIS — Z20822 Contact with and (suspected) exposure to covid-19: Secondary | ICD-10-CM | POA: Diagnosis not present

## 2021-01-24 DIAGNOSIS — J40 Bronchitis, not specified as acute or chronic: Secondary | ICD-10-CM | POA: Insufficient documentation

## 2021-01-24 DIAGNOSIS — R509 Fever, unspecified: Secondary | ICD-10-CM | POA: Diagnosis present

## 2021-01-24 LAB — RESP PANEL BY RT-PCR (FLU A&B, COVID) ARPGX2
Influenza A by PCR: NEGATIVE
Influenza B by PCR: NEGATIVE
SARS Coronavirus 2 by RT PCR: NEGATIVE

## 2021-01-24 MED ORDER — BENZONATATE 100 MG PO CAPS: 100.0000 mg | ORAL_CAPSULE | Freq: Four times a day (QID) | ORAL | 0 refills | Status: AC | PRN

## 2021-01-24 MED ORDER — ACETAMINOPHEN 500 MG PO TABS
1000.0000 mg | ORAL_TABLET | Freq: Once | ORAL | Status: AC
  Administered 2021-01-24: 1000 mg via ORAL
  Filled 2021-01-24: qty 2

## 2021-01-24 MED ORDER — BENZONATATE 100 MG PO CAPS
100.0000 mg | ORAL_CAPSULE | Freq: Once | ORAL | Status: AC
  Administered 2021-01-24: 100 mg via ORAL
  Filled 2021-01-24: qty 1

## 2021-01-24 NOTE — ED Notes (Signed)
See triage note  Presents with possible COVID exposure  Low grade temp cough and headache

## 2021-01-24 NOTE — ED Provider Notes (Signed)
[facility] Emergency Department Provider Note  ____________________________________________   Event Date/Time   First MD Initiated Contact with Patient 01/24/21 1223     (approximate)  I have reviewed the triage vital signs and the nursing notes.   HISTORY  Chief Complaint Fever   HPI Pam Snyder is a 55 y.o. female with a past medical history of anemia, arthritis, ASD, sickle cell trait, CVA without residual deficits, migraine headaches, DDD, and recurrent bronchitis who presents for assessment approximately 4 days of worsening cough, congestion, some shortness of breath with exertion, soreness in her abdominal muscles when coughing and mild headache.  She states child at home was recently diagnosed with RSV and thinks he may have a bronchitis or pneumonia.  She denies any earache, sore throat, chest pain, other abdominal pains, back pain, nausea, vomiting, diarrhea, dysuria, rash or any other acute sick symptoms.  Denies any tobacco abuse.  She has not taken any Tylenol or ibuprofen for her symptoms.  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   Stroke ([place])    15 years ago, no deficits    Patient Active Problem List   Diagnosis Date Noted   Chest pain 04/04/2018   DDD (degenerative disc disease), cervical 01/12/2017   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   Internal  hernia    Abdominal pain    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   Chest wall pain 01/28/2016    Past Surgical History:  Procedure Laterality Date   CARDIAC CATHETERIZATION     x3 last in 2014, device implanted to help MVP   ENDOMETRIAL ABLATION  2014   [place]  01/04/2017   Procedure: HERNIA REPAIR;  Surgeon: Jules Husbands, MD;  Location: [facility] ORS;  Service: General;;   fibroid cyst     LAPAROTOMY N/A 01/04/2017   Procedure: EXPLORATORY LAPAROTOMY;  Surgeon: Jules Husbands, MD;  Location: [facility] ORS;  Service: General;  Laterality: N/A;   LYSIS OF ADHESION  01/04/2017   Procedure: LYSIS OF ADHESION;  Surgeon: Jules Husbands, MD;  Location: [facility] ORS;  Service: General;;    Prior to Admission medications   Medication Sig Start Date End Date Taking? Authorizing Provider  benzonatate (TESSALON PERLES) 100 MG capsule Take 1 capsule (100 mg total) by mouth every 6 (six) hours as needed for up to 7 days for cough. 01/24/21 01/31/21 Yes Lucrezia Starch, MD  albuterol [facility] HFA) 108 (90 Base) MCG/ACT inhaler Inhale 2 puffs into the lungs as needed.    [provider]  chlorthalidone (HYGROTON) 25 MG tablet Take 1 tablet (25 mg total) by mouth daily. Patient not taking: Reported on 07/15/2020 04/06/18   Epifanio Lesches, MD  ondansetron (ZOFRAN-ODT) 4 MG disintegrating tablet  Take 1 tablet (4 mg total) by mouth every 8 (eight) hours as needed for nausea or vomiting. Patient not taking: Reported on 07/15/2020 06/18/20   Cuthriell, Roderic Palau D, PA-C  polyethylene glycol (MIRALAX / GLYCOLAX) packet Take 17 g by mouth every other day. Patient not taking: Reported on 07/15/2020    [provider]    Allergies No known allergies  Family History  Problem Relation Age of Onset   Lung cancer Mother    Leukemia Father    Sickle cell anemia Daughter    Breast cancer Maternal Aunt 79   Breast cancer Maternal Aunt 50   Colon cancer Maternal Grandmother    Sickle cell  anemia Brother     Social History Social History   Tobacco Use   Smoking status: Never   Smokeless tobacco: Never  Vaping Use   Vaping Use: Never used  Substance Use Topics   Alcohol use: No   Drug use: No    Review of Systems  Review of Systems  Constitutional:  Positive for chills, fever and malaise/fatigue.  HENT:  Positive for congestion. Negative for sore throat.   Eyes:  Negative for pain.  Respiratory:  Positive for cough and shortness of breath (mild w/ exertion). Negative for stridor.   Cardiovascular:  Negative for chest pain.  Gastrointestinal:  Positive for abdominal pain (when coughin). Negative for vomiting.  Genitourinary:  Negative for dysuria.  Musculoskeletal:  Positive for myalgias.  Skin:  Negative for rash.  Neurological:  Positive for headaches. Negative for seizures and loss of consciousness.  Psychiatric/Behavioral:  Negative for suicidal ideas.   All other systems reviewed and are negative.    ____________________________________________   PHYSICAL EXAM:  VITAL SIGNS: ED Triage Vitals  Enc Vitals Group     BP 01/24/21 1149 (!) 159/90     Pulse Rate 01/24/21 1149 74     Resp 01/24/21 1149 18     Temp 01/24/21 1149 99.1 F (37.3 C)     Temp Source 01/24/21 1149 Oral     SpO2 01/24/21 1149 99 %     Weight 01/24/21 1150 208 lb (94.3 kg)     Height 01/24/21 1150 '5\' 8"'$  (1.727 m)     Head Circumference --      Peak Flow --      Pain Score 01/24/21 1148 7     Pain Loc --      Pain Edu? --      Excl. in [place]? --    Vitals:   01/24/21 1149  BP: (!) 159/90  Pulse: 74  Resp: 18  Temp: 99.1 F (37.3 C)  SpO2: 99%   Physical Exam Vitals and nursing note reviewed.  Constitutional:      General: She is not in acute distress.    Appearance: She is well-developed.  HENT:     Head: Normocephalic and atraumatic.     Right Ear: External ear normal.     Left Ear: External ear normal.     Nose: Nose normal.     Mouth/Throat:     Mouth:  Mucous membranes are moist.  Eyes:     Conjunctiva/sclera: Conjunctivae normal.  Cardiovascular:     Rate and Rhythm: Normal rate and regular rhythm.     Heart sounds: No murmur heard. Pulmonary:     Effort: Pulmonary effort is normal. No respiratory distress.     Breath sounds: Normal breath sounds. No stridor. No wheezing, rhonchi or rales.  Chest:  Chest wall: No tenderness.  Abdominal:     Palpations: Abdomen is soft.     Tenderness: There is no abdominal tenderness.  Musculoskeletal:     Cervical back: Neck supple.  Skin:    General: Skin is warm and dry.     Capillary Refill: Capillary refill takes less than 2 seconds.  Neurological:     Mental Status: She is alert and oriented to person, place, and time.  Psychiatric:        Mood and Affect: Mood normal.     ____________________________________________   LABS (all labs ordered are listed, but only abnormal results are displayed)  Labs Reviewed  RESP PANEL BY RT-PCR (FLU A&B, COVID) [id number]   ____________________________________________  EKG  RADIOLOGY  ED MD interpretation: No focal consolidation, effusion, edema, pneumothorax or any other clear acute intrathoracic process.  Official radiology report(s): DG Chest Portable 1 View  Result Date: 01/24/2021 CLINICAL DATA:  Cough, fever EXAM: PORTABLE CHEST 1 VIEW COMPARISON:  07/04/2020 FINDINGS: Unchanged cardiac and mediastinal contours. No focal pulmonary opacity, pleural effusion, or pneumothorax. No acute osseous abnormality. IMPRESSION: No acute cardiopulmonary process. Electronically Signed   By: Merilyn Baba M.D.   On: 01/24/2021 13:51    ____________________________________________   PROCEDURES  Procedure(s) performed (including Critical Care):  Procedures   ____________________________________________   INITIAL IMPRESSION / ASSESSMENT AND PLAN / ED COURSE      Patient presents with above to history exam for couple days of cough, headache,  congestion, some rectus abdominal muscle soreness with coughing and shortness of breath with exertion.  She notes she was around someone who was recently diagnosed with RSV.  On arrival she is hypertensive with otherwise stable vital signs on room air.  Differential includes pneumonia, bronchitis, and sinusitis.  Patient's not appear septic.  I have a low suspicion for PE given patient denies any chest pain or shortness of breath at rest and has no tachypnea, tachycardia or hypoxia or clear risk factors.  In addition she denies any chest pain and I have a low suspicion for ACS or myocarditis.  No abnormal breath sounds or findings on chest x-ray suggest bacterial pneumonia at this time.  No evidence of heart failure pneumothorax or other clear acute process.  Suspect likely viral process possibly RSV given patient has a close contact with this.  COVID influenza PCR is negative.  Will order Rx for Tessalon.  Advised he may take Tylenol as needed for discomfort.  Discharged stable condition.  Strict return precautions advised and discussed.  Advised her to have her blood pressure rechecked in a couple days as it was quite elevated today.        ____________________________________________   FINAL CLINICAL IMPRESSION(S) / ED DIAGNOSES  Final diagnoses:  Bronchitis  Hypertension, unspecified type    Medications  acetaminophen (TYLENOL) tablet 1,000 mg (1,000 mg Oral Given 01/24/21 1310)  benzonatate (TESSALON) capsule 100 mg (100 mg Oral Given 01/24/21 1310)     ED Discharge Orders          Ordered    benzonatate (TESSALON PERLES) 100 MG capsule  Every 6 hours PRN        01/24/21 1357             Note:  This document was prepared using Dragon voice recognition software and may include unintentional dictation errors.    Lucrezia Starch, MD 01/24/21 1359

## 2021-01-24 NOTE — ED Triage Notes (Signed)
Pt comes with c/o fever cough and headache. Pt states covid exposure.

## 2021-02-05 ENCOUNTER — Emergency Department
Admission: EM | Admit: 2021-02-05 | Discharge: 2021-02-05 | Disposition: A | Discharge status: Home / Self Care | Payer: Medicaid Other | Attending: Emergency Medicine | Admitting: Emergency Medicine

## 2021-02-05 ENCOUNTER — Other Ambulatory Visit: Payer: Self-pay

## 2021-02-05 ENCOUNTER — Emergency Department: Payer: Medicaid Other

## 2021-02-05 DIAGNOSIS — R079 Chest pain, unspecified: Secondary | ICD-10-CM

## 2021-02-05 DIAGNOSIS — I2510 Atherosclerotic heart disease of native coronary artery without angina pectoris: Secondary | ICD-10-CM | POA: Diagnosis not present

## 2021-02-05 DIAGNOSIS — R059 Cough, unspecified: Secondary | ICD-10-CM | POA: Diagnosis present

## 2021-02-05 DIAGNOSIS — R0789 Other chest pain: Secondary | ICD-10-CM | POA: Diagnosis not present

## 2021-02-05 LAB — CBC
HCT: 38.7 % (ref 36.0–46.0)
Hemoglobin: 12.3 g/dL (ref 12.0–15.0)
MCH: 24.4 pg — ABNORMAL LOW (ref 26.0–34.0)
MCHC: 31.8 g/dL (ref 30.0–36.0)
MCV: 76.6 fL — ABNORMAL LOW (ref 80.0–100.0)
Platelets: 232 10*3/uL (ref 150–400)
RBC: 5.05 MIL/uL (ref 3.87–5.11)
RDW: 14.7 % (ref 11.5–15.5)
WBC: 4.2 10*3/uL (ref 4.0–10.5)
nRBC: 0 % (ref 0.0–0.2)

## 2021-02-05 LAB — BASIC METABOLIC PANEL
Anion gap: 9 (ref 5–15)
BUN: 14 mg/dL (ref 6–20)
CO2: 23 mmol/L (ref 22–32)
Calcium: 9 mg/dL (ref 8.9–10.3)
Chloride: 106 mmol/L (ref 98–111)
Creatinine, Ser: 0.86 mg/dL (ref 0.44–1.00)
GFR, Estimated: >60 mL/min (ref >60)
Glucose, Bld: 100 mg/dL — ABNORMAL HIGH (ref 70–99)
Potassium: 3.5 mmol/L (ref 3.5–5.1)
Sodium: 138 mmol/L (ref 135–145)

## 2021-02-05 LAB — TROPONIN I (HIGH SENSITIVITY): Troponin I (High Sensitivity): 4 ng/L (ref <18)

## 2021-02-05 MED ORDER — IBUPROFEN 400 MG PO TABS
400.0000 mg | ORAL_TABLET | Freq: Once | ORAL | Status: AC
  Administered 2021-02-05: 400 mg via ORAL
  Filled 2021-02-05: qty 1

## 2021-02-05 NOTE — ED Notes (Addendum)
Clarified that pt did not come EMS-confusion with another pt. Pt did NOT receive aspirin.

## 2021-02-05 NOTE — ED Notes (Signed)
RN Pam Snyder states pt came by EMS and c/o CP at that time. Pt was given 324 aspirin. Pt states pain in chest of 9/10 and different pain in chest for cough.

## 2021-02-05 NOTE — ED Triage Notes (Signed)
Pt come with c/o productive cough and bronchitis. Pt states this has been ongoing for week. Pt states pain from coughing. Pt states she took all prescribed meds.

## 2021-02-05 NOTE — ED Provider Notes (Signed)
[facility]  ____________________________________________   Event Date/Time   First MD Initiated Contact with Patient 02/05/21 1617     (approximate)  I have reviewed the triage vital signs and the nursing notes.   HISTORY  Chief Complaint Cough and Chest Pain    HPI Pam Snyder is a 55 y.o. female past medical history of CAD, DJD, ASD, VSD who presents with chest pain.  Patient has had bronchitis over the past several days.  She was seen here in the ED and discharged with [facility].  She has had cough productive of yellow sputum.  She denies dyspnea.  Today she developed some left-sided chest wall pain.  It is intermittent, nonexertional without associated symptoms of nausea or diaphoresis.  It is nonpleuritic.  She denies any history of DVT/VTE, lower extremity swelling or pain, history of cancer or hemoptysis.         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   Stroke ([place])    15 years ago, no deficits    Patient Active Problem List   Diagnosis Date Noted   Chest pain 04/04/2018   DDD (degenerative disc disease), cervical 01/12/2017   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   Internal hernia    Abdominal pain    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   Chest wall pain 01/28/2016    Past Surgical History:  Procedure Laterality Date   CARDIAC CATHETERIZATION     x3 last in 2014, device implanted to help MVP    ENDOMETRIAL ABLATION  2014   [place]  01/04/2017   Procedure: HERNIA REPAIR;  Surgeon: Jules Husbands, MD;  Location: [facility] ORS;  Service: General;;   fibroid cyst     LAPAROTOMY N/A 01/04/2017   Procedure: EXPLORATORY LAPAROTOMY;  Surgeon: Jules Husbands, MD;  Location: [facility] ORS;  Service: General;  Laterality: N/A;   LYSIS OF ADHESION  01/04/2017   Procedure: LYSIS OF ADHESION;  Surgeon: Jules Husbands, MD;  Location: [facility] ORS;  Service: General;;    Prior to Admission medications   Medication Sig Start Date End Date Taking? Authorizing Provider  albuterol (PROAIR HFA) 108 (90 Base) MCG/ACT inhaler Inhale 2 puffs into the lungs as needed.    [provider]  chlorthalidone (HYGROTON) 25 MG tablet Take 1 tablet (25 mg total) by mouth daily. Patient not taking: Reported on 07/15/2020 04/06/18   Epifanio Lesches, MD  ondansetron (ZOFRAN-ODT) 4 MG disintegrating tablet Take 1 tablet (4 mg total) by mouth every 8 (eight) hours as needed for nausea or vomiting. Patient not taking: Reported on 07/15/2020 06/18/20   Cuthriell, Roderic Palau D, PA-C  polyethylene glycol (MIRALAX / GLYCOLAX) packet Take 17 g by mouth every other day. Patient not taking: Reported on 07/15/2020    [provider]    Allergies  No known allergies  Family History  Problem Relation Age of Onset   Lung cancer Mother    Leukemia Father    Sickle cell anemia Daughter    Breast cancer Maternal Aunt 20   Breast cancer Maternal Aunt 50   Colon cancer Maternal Grandmother    Sickle cell anemia Brother     Social History Social History   Tobacco Use   Smoking status: Never   Smokeless tobacco: Never  Vaping Use   Vaping Use: Never used  Substance Use Topics   Alcohol use: No   Drug use: No    Review of Systems   Review of Systems  Constitutional:  Negative for chills and fever.  Respiratory:  Positive for cough. Negative for shortness of breath.   Cardiovascular:  Positive  for chest pain.  Gastrointestinal:  Negative for abdominal pain, nausea and vomiting.  All other systems reviewed and are negative.  Physical Exam Updated Vital Signs BP 133/71   Pulse 68   Temp 98.4 F (36.9 C)   Resp 18   SpO2 99%   Physical Exam Vitals and nursing note reviewed.  Constitutional:      General: She is not in acute distress.    Appearance: Normal appearance.  HENT:     Head: Normocephalic and atraumatic.  Eyes:     General: No scleral icterus.    Conjunctiva/sclera: Conjunctivae normal.  Pulmonary:     Effort: Pulmonary effort is normal. No respiratory distress.     Breath sounds: Normal breath sounds. No stridor.  Musculoskeletal:        General: No deformity or signs of injury.     Cervical back: Normal range of motion.     Right lower leg: No tenderness. No edema.     Left lower leg: No tenderness. No edema.  Skin:    General: Skin is dry.     Coloration: Skin is not jaundiced or pale.  Neurological:     General: No focal deficit present.     Mental Status: She is alert and oriented to person, place, and time. Mental status is at baseline.  Psychiatric:        Mood and Affect: Mood normal.        Behavior: Behavior normal.     LABS (all labs ordered are listed, but only abnormal results are displayed)  Labs Reviewed  BASIC METABOLIC PANEL - Abnormal; Notable for the following components:      Result Value   Glucose, Bld 100 (*)    All other components within normal limits  CBC - Abnormal; Notable for the following components:   MCV 76.6 (*)    MCH 24.4 (*)    All other components within normal limits  POC URINE PREG, ED  TROPONIN I (HIGH SENSITIVITY)   ____________________________________________  EKG  This rhythm, normal axis, LVH, LVH with strain pattern, T wave inversions in inferior leads, V4 through V6, similar to prior ____________________________________________  RADIOLOGY I, Madelin Headings, personally viewed and evaluated  these images (plain radiographs) as part of my medical decision making, as well as reviewing the written report by the radiologist.  ED MD interpretation: I reviewed the chest x-ray which does not show any acute cardiopulmonary process    ____________________________________________   PROCEDURES  Procedure(s) performed (including Critical Care):  Procedures   ____________________________________________   INITIAL IMPRESSION / ASSESSMENT AND PLAN / ED COURSE     55 year old female presenting with chest pain.  Pain is  atypical and that it is nonexertional without associated symptoms.  Seems to be in the setting of her frequent cough with recent diagnosis of bronchitis.  Her EKG has LVH with strain pattern but is unchanged from prior.  Chest x-ray without infiltrate.  Her troponin is negative.  Low suspicion for ACS.  She has no vital sign abnormalities and no risk factors for PE so suspicion for this is low.  Suspect pleurisy related to her underlying bronchitis.  She is stable for discharge.  I recommended NSAIDs.   ____________________________________________   FINAL CLINICAL IMPRESSION(S) / ED DIAGNOSES  Final diagnoses:  Cough  Chest pain, unspecified type     ED Discharge Orders     None        Note:  This document was prepared using Dragon voice recognition software and may include unintentional dictation errors.    Rada Hay, MD 02/05/21 [phone]

## 2021-02-05 NOTE — Discharge Instructions (Addendum)
Your EKG, blood work and chest x-ray were reassuring today.  Please follow-up with your primary care physician if you continue to have symptoms.

## 2021-05-16 ENCOUNTER — Ambulatory Visit: Payer: Medicaid Other

## 2021-05-21 ENCOUNTER — Encounter: Payer: Self-pay | Admitting: Emergency Medicine

## 2021-05-21 ENCOUNTER — Emergency Department
Admission: EM | Admit: 2021-05-21 | Discharge: 2021-05-22 | Disposition: A | Discharge status: Home / Self Care | Payer: Medicaid Other | Attending: Emergency Medicine | Admitting: Emergency Medicine

## 2021-05-21 ENCOUNTER — Emergency Department: Payer: Medicaid Other

## 2021-05-21 DIAGNOSIS — Z20822 Contact with and (suspected) exposure to covid-19: Secondary | ICD-10-CM | POA: Insufficient documentation

## 2021-05-21 DIAGNOSIS — M79602 Pain in left arm: Secondary | ICD-10-CM | POA: Insufficient documentation

## 2021-05-21 DIAGNOSIS — J101 Influenza due to other identified influenza virus with other respiratory manifestations: Secondary | ICD-10-CM | POA: Insufficient documentation

## 2021-05-21 DIAGNOSIS — J40 Bronchitis, not specified as acute or chronic: Secondary | ICD-10-CM | POA: Diagnosis not present

## 2021-05-21 DIAGNOSIS — Z7951 Long term (current) use of inhaled steroids: Secondary | ICD-10-CM | POA: Diagnosis not present

## 2021-05-21 DIAGNOSIS — J111 Influenza due to unidentified influenza virus with other respiratory manifestations: Secondary | ICD-10-CM

## 2021-05-21 DIAGNOSIS — R059 Cough, unspecified: Secondary | ICD-10-CM | POA: Diagnosis present

## 2021-05-21 LAB — CBC
HCT: 37.7 % (ref 36.0–46.0)
Hemoglobin: 12.1 g/dL (ref 12.0–15.0)
MCH: 23.4 pg — ABNORMAL LOW (ref 26.0–34.0)
MCHC: 32.1 g/dL (ref 30.0–36.0)
MCV: 73.1 fL — ABNORMAL LOW (ref 80.0–100.0)
Platelets: 186 10*3/uL (ref 150–400)
RBC: 5.16 MIL/uL — ABNORMAL HIGH (ref 3.87–5.11)
RDW: 15.1 % (ref 11.5–15.5)
WBC: 5.4 10*3/uL (ref 4.0–10.5)
nRBC: 0 % (ref 0.0–0.2)

## 2021-05-21 LAB — BASIC METABOLIC PANEL
Anion gap: 6 (ref 5–15)
BUN: 11 mg/dL (ref 6–20)
CO2: 27 mmol/L (ref 22–32)
Calcium: 8.3 mg/dL — ABNORMAL LOW (ref 8.9–10.3)
Chloride: 105 mmol/L (ref 98–111)
Creatinine, Ser: 0.67 mg/dL (ref 0.44–1.00)
GFR, Estimated: >60 mL/min (ref >60)
Glucose, Bld: 128 mg/dL — ABNORMAL HIGH (ref 70–99)
Potassium: 3.9 mmol/L (ref 3.5–5.1)
Sodium: 138 mmol/L (ref 135–145)

## 2021-05-21 LAB — RESP PANEL BY RT-PCR (FLU A&B, COVID) ARPGX2
Influenza A by PCR: NEGATIVE
Influenza B by PCR: NEGATIVE
SARS Coronavirus 2 by RT PCR: NEGATIVE

## 2021-05-21 LAB — TROPONIN I (HIGH SENSITIVITY): Troponin I (High Sensitivity): 8 ng/L (ref <18)

## 2021-05-21 MED ORDER — IPRATROPIUM-ALBUTEROL 0.5-2.5 (3) MG/3ML IN SOLN
3.0000 mL | Freq: Once | RESPIRATORY_TRACT | Status: AC
  Administered 2021-05-21: 23:00:00 3 mL via RESPIRATORY_TRACT
  Filled 2021-05-21: qty 3

## 2021-05-21 MED ORDER — IBUPROFEN 600 MG PO TABS
600.0000 mg | ORAL_TABLET | Freq: Once | ORAL | Status: AC
  Administered 2021-05-21: 21:00:00 600 mg via ORAL
  Filled 2021-05-21: qty 1

## 2021-05-21 MED ORDER — AZITHROMYCIN 500 MG PO TABS
500.0000 mg | ORAL_TABLET | Freq: Once | ORAL | Status: AC
  Administered 2021-05-21: 23:00:00 500 mg via ORAL
  Filled 2021-05-21: qty 1

## 2021-05-21 MED ORDER — METHYLPREDNISOLONE SODIUM SUCC 125 MG IJ SOLR
125.0000 mg | Freq: Once | INTRAMUSCULAR | Status: AC
  Administered 2021-05-21: 125 mg via INTRAVENOUS
  Filled 2021-05-21: qty 2

## 2021-05-21 MED ORDER — LACTATED RINGERS IV BOLUS
1000.0000 mL | Freq: Once | INTRAVENOUS | Status: AC
  Administered 2021-05-21: 23:00:00 1000 mL via INTRAVENOUS

## 2021-05-21 MED ORDER — IPRATROPIUM-ALBUTEROL 0.5-2.5 (3) MG/3ML IN SOLN
3.0000 mL | Freq: Once | RESPIRATORY_TRACT | Status: AC
  Administered 2021-05-21: 21:00:00 3 mL via RESPIRATORY_TRACT
  Filled 2021-05-21: qty 3

## 2021-05-21 NOTE — ED Triage Notes (Addendum)
Pt in with cough, L arm pain. Pt states grandkids have recently been sick with Flu and RSV, and she developed cough and bronchitis 2 days ago. Has been using OTC meds since, took Tylenol 1 hr ago (temp 102.7 in triage). C/o some rib/chest wall pain and sob in triage. Pt states stabbing L arm pain now, every time she coughs

## 2021-05-22 ENCOUNTER — Emergency Department: Payer: Medicaid Other

## 2021-05-22 ENCOUNTER — Encounter: Payer: Self-pay | Admitting: Radiology

## 2021-05-22 LAB — D-DIMER, QUANTITATIVE: D-Dimer, Quant: 0.81 ug/mL-FEU — ABNORMAL HIGH (ref 0.00–0.50)

## 2021-05-22 LAB — TROPONIN I (HIGH SENSITIVITY): Troponin I (High Sensitivity): 7 ng/L (ref <18)

## 2021-05-22 MED ORDER — ALBUTEROL SULFATE HFA 108 (90 BASE) MCG/ACT IN AERS: 2.0000 | INHALATION_SPRAY | Freq: Four times a day (QID) | RESPIRATORY_TRACT | 2 refills | Status: AC | PRN

## 2021-05-22 MED ORDER — AZITHROMYCIN 250 MG PO TABS: ORAL_TABLET | ORAL | 0 refills | Status: DC

## 2021-05-22 MED ORDER — IOHEXOL 350 MG/ML SOLN
100.0000 mL | Freq: Once | INTRAVENOUS | Status: AC | PRN
  Administered 2021-05-22: 100 mL via INTRAVENOUS

## 2021-05-22 MED ORDER — PREDNISONE 20 MG PO TABS: 60.0000 mg | ORAL_TABLET | Freq: Every day | ORAL | 0 refills | Status: AC

## 2021-05-22 NOTE — ED Provider Notes (Signed)
[facility] Emergency Department Provider Note  ____________________________________________  Time seen: Approximately 1:22 AM  I have reviewed the triage vital signs and the nursing notes.   HISTORY  Chief Complaint Cough and L arm pain   HPI Pam Snyder is a 55 y.o. female with history of anemia, migraine headaches, sickle cell trait, stroke who presents for evaluation of flulike symptoms.  Patient reports that her grandkids have been diagnosed with influenza and RSV.  For the last 2 days, she has had cough, fever, body aches, congestion, chest tightness, and dyspnea with exertion.  She is also complaining of left-sided rib/chest wall sharp pain that is present with coughing or deep inspiration.  She denies any personal or family history of PE or DVT, no recent travel immobilization, no leg pain or swelling, no hemoptysis, no exogenous hormones, no history of cancer.  No history of smoking, COPD, asthma, or emphysema.  She denies abdominal pain, vomiting or diarrhea.  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   Stroke ([place])    15 years ago, no deficits    Patient Active Problem List   Diagnosis Date Noted   Chest pain 04/04/2018   DDD (degenerative disc disease), cervical 01/12/2017   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   Internal hernia    Abdominal pain    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   Chest wall pain 01/28/2016     Past Surgical History:  Procedure Laterality Date   CARDIAC CATHETERIZATION     x3 last in 2014, device implanted to help MVP   ENDOMETRIAL ABLATION  2014   [place]  01/04/2017   Procedure: HERNIA REPAIR;  Surgeon: Jules Husbands, MD;  Location: [facility] ORS;  Service: General;;   fibroid cyst     LAPAROTOMY N/A 01/04/2017   Procedure: EXPLORATORY LAPAROTOMY;  Surgeon: Jules Husbands, MD;  Location: [facility] ORS;  Service: General;  Laterality: N/A;   LYSIS OF ADHESION  01/04/2017   Procedure: LYSIS OF ADHESION;  Surgeon: Jules Husbands, MD;  Location: [facility] ORS;  Service: General;;    Prior to Admission medications   Medication Sig Start Date End Date Taking? Authorizing Provider  albuterol (VENTOLIN HFA) 108 (90 Base) MCG/ACT inhaler Inhale 2 puffs into the lungs every 6 (six) hours as needed for wheezing or shortness of breath. 05/22/21  Yes Alfred Levins, [place], MD  azithromycin [facility]) 250 MG tablet Take 1 a day for 4 days 05/22/21  Yes Alfred Levins, [place], MD  predniSONE (DELTASONE) 20 MG tablet Take 3 tablets (60 mg total) by mouth daily with breakfast for 4 days. 05/22/21 05/26/21 Yes Babyboy Loya, [place], MD  chlorthalidone (HYGROTON) 25 MG tablet Take 1 tablet (25 mg total) by mouth daily. Patient not taking: Reported on 07/15/2020 04/06/18   Epifanio Lesches, MD  ondansetron (ZOFRAN-ODT) 4 MG  disintegrating tablet Take 1 tablet (4 mg total) by mouth every 8 (eight) hours as needed for nausea or vomiting. Patient not taking: Reported on 07/15/2020 06/18/20   Cuthriell, Roderic Palau D, PA-C  polyethylene glycol (MIRALAX / GLYCOLAX) packet Take 17 g by mouth every other day. Patient not taking: Reported on 07/15/2020    [provider]    Allergies No known allergies  Family History  Problem Relation Age of Onset   Lung cancer Mother    Leukemia Father    Sickle cell anemia Daughter    Breast cancer Maternal Aunt 20   Breast cancer Maternal Aunt 50    Colon cancer Maternal Grandmother    Sickle cell anemia Brother     Social History Social History   Tobacco Use   Smoking status: Never   Smokeless tobacco: Never  Vaping Use   Vaping Use: Never used  Substance Use Topics   Alcohol use: No   Drug use: No    Review of Systems  Constitutional: + fever, body aches Eyes: Negative for visual changes. ENT: Negative for sore throat. Neck: No neck pain  Cardiovascular: + chest pain and tightness Respiratory: + shortness of breath and cough Gastrointestinal: Negative for abdominal pain, vomiting or diarrhea. Genitourinary: Negative for dysuria. Musculoskeletal: Negative for back pain. Skin: Negative for rash. Neurological: Negative for headaches, weakness or numbness. Psych: No SI or HI  ____________________________________________   PHYSICAL EXAM:  VITAL SIGNS: Vitals:   05/22/21 0155 05/22/21 0157  BP:  132/82  Pulse: (!) 110 97  Resp:  20  Temp:    SpO2: 97% 98%     Constitutional: Alert and oriented. Well appearing and in no apparent distress. HEENT:      Head: Normocephalic and atraumatic.         Eyes: Conjunctivae are normal. Sclera is non-icteric.       Mouth/Throat: Mucous membranes are moist.       Neck: Supple with no signs of meningismus. Cardiovascular: Regular rate and rhythm. No murmurs, gallops, or rubs. 2+ symmetrical distal pulses are present in all extremities. No JVD. Respiratory: Tachypneic but no hypoxia, diminished air movement bilaterally with no wheezing or crackle  gastrointestinal: Soft, non tender, and non distended with positive bowel sounds. No rebound or guarding. Genitourinary: No CVA tenderness. Musculoskeletal:  No edema, cyanosis, or erythema of extremities. Neurologic: Normal speech and language. Face is symmetric. Moving all extremities. No gross focal neurologic deficits are appreciated. Skin: Skin is warm, dry and intact. No rash noted. Psychiatric: Mood and affect are normal.  Speech and behavior are normal.  ____________________________________________   LABS (all labs ordered are listed, but only abnormal results are displayed)  Labs Reviewed  BASIC METABOLIC PANEL - Abnormal; Notable for the following components:      Result Value   Glucose, Bld 128 (*)    Calcium 8.3 (*)    All other components within normal limits  CBC - Abnormal; Notable for the following components:   RBC 5.16 (*)    MCV 73.1 (*)    MCH 23.4 (*)    All other components within normal limits  D-DIMER, QUANTITATIVE - Abnormal; Notable for the following components:   D-Dimer, Quant 0.81 (*)    All other components within normal limits  RESP PANEL BY RT-PCR (FLU A&B, COVID) ARPGX2  POC URINE PREG, ED  TROPONIN I (HIGH SENSITIVITY)  TROPONIN I (HIGH SENSITIVITY)   ____________________________________________  EKG  ED ECG REPORT I, Rudene Re, the  attending physician, personally viewed and interpreted this ECG.  Sinus tachycardia with a rate of 104, LVH, T wave inversions in lateral leads with no ST elevations. ____________________________________________  RADIOLOGY  I have personally reviewed the images performed during this visit and I agree with the Radiologist's read.   Interpretation by Radiologist:  DG Chest 2 View  Result Date: 05/21/2021 CLINICAL DATA:  Chest pain EXAM: CHEST - 2 VIEW COMPARISON:  02/05/2021 FINDINGS: The heart size and mediastinal contours are within normal limits. Both lungs are clear. The visualized skeletal structures are unremarkable. IMPRESSION: No active cardiopulmonary disease. Electronically Signed   By: Donavan Foil M.D.   On: 05/21/2021 21:30   CT Angio Chest PE W and/or Wo Contrast  Result Date: 05/22/2021 CLINICAL DATA:  Positive D-dimer.  Concern for pulmonary embolism. EXAM: CT ANGIOGRAPHY CHEST WITH CONTRAST TECHNIQUE: Multidetector CT imaging of the chest was performed using the standard protocol during bolus administration  of intravenous contrast. Multiplanar CT image reconstructions and MIPs were obtained to evaluate the vascular anatomy. CONTRAST:  110mL OMNIPAQUE IOHEXOL 350 MG/ML SOLN COMPARISON:  Chest radiograph dated 05/22/2021. CT dated 07/04/2020. FINDINGS: Cardiovascular: Borderline cardiomegaly. No pericardial effusion. Coronary vascular calcification of the LAD. Atrial septal defect Amplatzer device. The thoracic aorta is unremarkable. 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 Evaluation of the pulmonary arteries is limited due to respiratory motion artifact and suboptimal visualization of the peripheral branches. No large or central pulmonary artery embolus identified. Mediastinum/Nodes: No hilar or mediastinal adenopathy. The esophagus and the thyroid gland are grossly unremarkable. No mediastinal fluid collection. Lungs/Pleura: The lungs are clear. There is no pleural effusion pneumothorax. The central airways are patent. Upper Abdomen: Fatty liver. Musculoskeletal: No acute osseous pathology. Review of the MIP images confirms the above findings. IMPRESSION: 1. No acute intrathoracic pathology. No CT evidence of central pulmonary artery embolus. 2. Left-sided aortic arch with aberrant right subclavian artery anatomy. 3. Fatty liver. Electronically Signed   By: Anner Crete M.D.   On: 05/22/2021 01:07     ____________________________________________   PROCEDURES  Procedure(s) performed:yes .1-3 Lead EKG Interpretation Performed by: Rudene Re, MD Authorized by: Rudene Re, MD     Interpretation: non-specific     ECG rate assessment: tachycardic     Rhythm: sinus tachycardia     Ectopy: none     Conduction: normal     Critical Care performed:  yes  CRITICAL CARE Performed by: Rudene Re  ?  Total critical care time: 30 min  Critical care time was exclusive of separately billable procedures and treating other patients.  Critical  care was necessary to treat or prevent imminent or life-threatening deterioration.  Critical care was time spent personally by me on the following activities: development of treatment plan with patient and/or surrogate as well as nursing, discussions with consultants, evaluation of patient's response to treatment, examination of patient, obtaining history from patient or surrogate, ordering and performing treatments and interventions, ordering and review of laboratory studies, ordering and review of radiographic studies, pulse oximetry and re-evaluation of patient's condition.  ____________________________________________   INITIAL IMPRESSION / ASSESSMENT AND PLAN / ED COURSE  55 y.o. female with history of anemia, migraine headaches, sickle cell trait, stroke who presents for evaluation of flulike symptoms.  Patient initially tachycardic in the setting of a fever 102.75F, slightly tachypneic with diminished air movement bilaterally but no wheezing or crackles and no hypoxia.  His vitals significantly improved after patient defervesced.  Chest  x-ray with no signs of pneumonia.  Due to the left-sided pleuritic chest pain and abnormal chest x-ray with a negative COVID and flu swabs a D-dimer was sent to rule out PE.  D-dimer was elevated therefore patient was sent for CT angio of the chest.  There is no signs of PE, pneumonia, pleural effusions, pericardial effusion, or any other abnormalities. EKG and troponins with no signs of mild/pericarditis. Labs with no signs of sepsis.  Grandkids have tested positive for flu and RSV.  This is most likely a viral syndrome possibly RSV.  She has no signs of respiratory distress.  She did receive duo nebs and steroids and azithromycin for bronchitis.  She reports that her shortness of breath and chest tightness improved after that.  She was ambulated with no signs of hypoxia both at rest and with ambulation.  She will be discharged home on steroids, albuterol, and  azithromycin.  Recommended O2 monitoring at home, close follow-up with primary care doctor.  Return to the emergency room for new or worsening chest pain or shortness of breath.       _____________________________________________ Please note:  Patient was evaluated in Emergency Department today for the symptoms described in the history of present illness. Patient was evaluated in the context of the global COVID-19 pandemic, which necessitated consideration that the patient might be at risk for infection with the SARS-CoV-2 virus that causes COVID-19. Institutional protocols and algorithms that pertain to the evaluation of patients at risk for COVID-19 are in a state of rapid change based on information released by regulatory bodies including the CDC and federal and state organizations. These policies and algorithms were followed during the patient's care in the ED.  Some ED evaluations and interventions may be delayed as a result of limited staffing during the pandemic.   [place] Controlled Substance Database was reviewed by me. ____________________________________________   FINAL CLINICAL IMPRESSION(S) / ED DIAGNOSES   Final diagnoses:  Influenza-like illness  Bronchitis      NEW MEDICATIONS STARTED DURING THIS VISIT:  ED Discharge Orders          Ordered    azithromycin (ZITHROMAX) 250 MG tablet        05/22/21 0202    predniSONE (DELTASONE) 20 MG tablet  Daily with breakfast        05/22/21 0202    albuterol (VENTOLIN HFA) 108 (90 Base) MCG/ACT inhaler  Every 6 hours PRN        05/22/21 0202             Note:  This document was prepared using Dragon voice recognition software and may include unintentional dictation errors.    Rudene Re, MD 05/22/21 [phone]

## 2021-10-14 ENCOUNTER — Inpatient Hospital Stay: Payer: Medicaid Other

## 2021-10-14 ENCOUNTER — Inpatient Hospital Stay
Admission: EM | Admit: 2021-10-14 | Discharge: 2021-10-15 | DRG: 438 | Disposition: A | Discharge status: Home / Self Care | Payer: Medicaid Other | Attending: Osteopathic Medicine | Admitting: Osteopathic Medicine

## 2021-10-14 ENCOUNTER — Emergency Department: Payer: Medicaid Other

## 2021-10-14 ENCOUNTER — Encounter: Payer: Self-pay | Admitting: Emergency Medicine

## 2021-10-14 ENCOUNTER — Other Ambulatory Visit: Payer: Self-pay

## 2021-10-14 DIAGNOSIS — I252 Old myocardial infarction: Secondary | ICD-10-CM

## 2021-10-14 DIAGNOSIS — R739 Hyperglycemia, unspecified: Secondary | ICD-10-CM | POA: Diagnosis present

## 2021-10-14 DIAGNOSIS — K529 Noninfective gastroenteritis and colitis, unspecified: Secondary | ICD-10-CM | POA: Diagnosis present

## 2021-10-14 DIAGNOSIS — K562 Volvulus: Secondary | ICD-10-CM | POA: Diagnosis present

## 2021-10-14 DIAGNOSIS — Z806 Family history of leukemia: Secondary | ICD-10-CM

## 2021-10-14 DIAGNOSIS — Z803 Family history of malignant neoplasm of breast: Secondary | ICD-10-CM

## 2021-10-14 DIAGNOSIS — I2510 Atherosclerotic heart disease of native coronary artery without angina pectoris: Secondary | ICD-10-CM | POA: Diagnosis present

## 2021-10-14 DIAGNOSIS — Z7984 Long term (current) use of oral hypoglycemic drugs: Secondary | ICD-10-CM | POA: Diagnosis not present

## 2021-10-14 DIAGNOSIS — Q2111 Secundum atrial septal defect: Secondary | ICD-10-CM | POA: Diagnosis not present

## 2021-10-14 DIAGNOSIS — Z801 Family history of malignant neoplasm of trachea, bronchus and lung: Secondary | ICD-10-CM | POA: Diagnosis not present

## 2021-10-14 DIAGNOSIS — Z79899 Other long term (current) drug therapy: Secondary | ICD-10-CM

## 2021-10-14 DIAGNOSIS — D573 Sickle-cell trait: Secondary | ICD-10-CM | POA: Diagnosis present

## 2021-10-14 DIAGNOSIS — Z832 Family history of diseases of the blood and blood-forming organs and certain disorders involving the immune mechanism: Secondary | ICD-10-CM | POA: Diagnosis not present

## 2021-10-14 DIAGNOSIS — R1013 Epigastric pain: Secondary | ICD-10-CM | POA: Diagnosis present

## 2021-10-14 DIAGNOSIS — K859 Acute pancreatitis without necrosis or infection, unspecified: Principal | ICD-10-CM | POA: Diagnosis present

## 2021-10-14 DIAGNOSIS — E876 Hypokalemia: Secondary | ICD-10-CM | POA: Diagnosis present

## 2021-10-14 DIAGNOSIS — K760 Fatty (change of) liver, not elsewhere classified: Secondary | ICD-10-CM | POA: Diagnosis present

## 2021-10-14 DIAGNOSIS — Z8673 Personal history of transient ischemic attack (TIA), and cerebral infarction without residual deficits: Secondary | ICD-10-CM | POA: Diagnosis not present

## 2021-10-14 DIAGNOSIS — E785 Hyperlipidemia, unspecified: Secondary | ICD-10-CM | POA: Diagnosis present

## 2021-10-14 DIAGNOSIS — I10 Essential (primary) hypertension: Secondary | ICD-10-CM

## 2021-10-14 DIAGNOSIS — K8500 Idiopathic acute pancreatitis without necrosis or infection: Secondary | ICD-10-CM | POA: Diagnosis not present

## 2021-10-14 DIAGNOSIS — Z800 Family history of malignant neoplasm of digestive organs: Secondary | ICD-10-CM | POA: Diagnosis not present

## 2021-10-14 LAB — URINALYSIS, ROUTINE W REFLEX MICROSCOPIC
Bilirubin Urine: NEGATIVE
Glucose, UA: NEGATIVE mg/dL
Hgb urine dipstick: NEGATIVE
Ketones, ur: NEGATIVE mg/dL
Leukocytes,Ua: NEGATIVE
Nitrite: NEGATIVE
Protein, ur: NEGATIVE mg/dL
Specific Gravity, Urine: >1.046 — ABNORMAL HIGH (ref 1.005–1.030)
pH: 5 (ref 5.0–8.0)

## 2021-10-14 LAB — CBC
HCT: 41.3 % (ref 36.0–46.0)
Hemoglobin: 13.1 g/dL (ref 12.0–15.0)
MCH: 23.3 pg — ABNORMAL LOW (ref 26.0–34.0)
MCHC: 31.7 g/dL (ref 30.0–36.0)
MCV: 73.4 fL — ABNORMAL LOW (ref 80.0–100.0)
Platelets: 236 10*3/uL (ref 150–400)
RBC: 5.63 MIL/uL — ABNORMAL HIGH (ref 3.87–5.11)
RDW: 14.2 % (ref 11.5–15.5)
WBC: 7.6 10*3/uL (ref 4.0–10.5)
nRBC: 0 % (ref 0.0–0.2)

## 2021-10-14 LAB — COMPREHENSIVE METABOLIC PANEL
ALT: 19 U/L (ref 0–44)
AST: 22 U/L (ref 15–41)
Albumin: 3.9 g/dL (ref 3.5–5.0)
Alkaline Phosphatase: 89 U/L (ref 38–126)
Anion gap: 8 (ref 5–15)
BUN: 15 mg/dL (ref 6–20)
CO2: 22 mmol/L (ref 22–32)
Calcium: 9.5 mg/dL (ref 8.9–10.3)
Chloride: 109 mmol/L (ref 98–111)
Creatinine, Ser: 0.81 mg/dL (ref 0.44–1.00)
GFR, Estimated: >60 mL/min (ref >60)
Glucose, Bld: 212 mg/dL — ABNORMAL HIGH (ref 70–99)
Potassium: 3.4 mmol/L — ABNORMAL LOW (ref 3.5–5.1)
Sodium: 139 mmol/L (ref 135–145)
Total Bilirubin: 0.3 mg/dL (ref 0.3–1.2)
Total Protein: 7 g/dL (ref 6.5–8.1)

## 2021-10-14 LAB — URINE DRUG SCREEN, QUALITATIVE (ARMC ONLY)
Amphetamines, Ur Screen: NOT DETECTED
Barbiturates, Ur Screen: NOT DETECTED
Benzodiazepine, Ur Scrn: NOT DETECTED
Cannabinoid 50 Ng, Ur ~~LOC~~: NOT DETECTED
Cocaine Metabolite,Ur ~~LOC~~: NOT DETECTED
MDMA (Ecstasy)Ur Screen: NOT DETECTED
Methadone Scn, Ur: NOT DETECTED
Opiate, Ur Screen: NOT DETECTED
Phencyclidine (PCP) Ur S: NOT DETECTED
Tricyclic, Ur Screen: NOT DETECTED

## 2021-10-14 LAB — CBG MONITORING, ED: Glucose-Capillary: 125 mg/dL — ABNORMAL HIGH (ref 70–99)

## 2021-10-14 LAB — TROPONIN I (HIGH SENSITIVITY)
Troponin I (High Sensitivity): 5 ng/L (ref <18)
Troponin I (High Sensitivity): 7 ng/L (ref <18)

## 2021-10-14 LAB — TRIGLYCERIDES: Triglycerides: 41 mg/dL (ref <150)

## 2021-10-14 LAB — LIPASE, BLOOD: Lipase: 634 U/L — ABNORMAL HIGH (ref 11–51)

## 2021-10-14 LAB — GLUCOSE, CAPILLARY: Glucose-Capillary: 135 mg/dL — ABNORMAL HIGH (ref 70–99)

## 2021-10-14 LAB — MAGNESIUM: Magnesium: 1.9 mg/dL (ref 1.7–2.4)

## 2021-10-14 MED ORDER — KETOROLAC TROMETHAMINE 15 MG/ML IJ SOLN
15.0000 mg | Freq: Once | INTRAMUSCULAR | Status: AC
  Administered 2021-10-14: 15 mg via INTRAVENOUS
  Filled 2021-10-14: qty 1

## 2021-10-14 MED ORDER — KETOROLAC TROMETHAMINE 30 MG/ML IJ SOLN
30.0000 mg | Freq: Four times a day (QID) | INTRAMUSCULAR | Status: DC | PRN
  Administered 2021-10-14 – 2021-10-15 (×2): 30 mg via INTRAVENOUS
  Filled 2021-10-14 (×2): qty 1

## 2021-10-14 MED ORDER — ACETAMINOPHEN 325 MG PO TABS: 650.0000 mg | ORAL_TABLET | Freq: Four times a day (QID) | ORAL | Status: DC | PRN

## 2021-10-14 MED ORDER — SODIUM CHLORIDE 0.9 % IV SOLN: INTRAVENOUS | Status: AC

## 2021-10-14 MED ORDER — ONDANSETRON HCL 4 MG PO TABS: 4.0000 mg | ORAL_TABLET | Freq: Four times a day (QID) | ORAL | Status: DC | PRN

## 2021-10-14 MED ORDER — ACETAMINOPHEN 650 MG RE SUPP: 650.0000 mg | Freq: Four times a day (QID) | RECTAL | Status: DC | PRN

## 2021-10-14 MED ORDER — HYDROMORPHONE HCL 1 MG/ML IJ SOLN
1.0000 mg | Freq: Once | INTRAMUSCULAR | Status: AC
  Administered 2021-10-14: 1 mg via INTRAVENOUS
  Filled 2021-10-14: qty 1

## 2021-10-14 MED ORDER — ONDANSETRON HCL 4 MG/2ML IJ SOLN
4.0000 mg | Freq: Once | INTRAMUSCULAR | Status: AC
  Administered 2021-10-14: 4 mg via INTRAVENOUS
  Filled 2021-10-14: qty 2

## 2021-10-14 MED ORDER — GADOBUTROL 1 MMOL/ML IV SOLN
9.0000 mL | Freq: Once | INTRAVENOUS | Status: AC | PRN
  Administered 2021-10-14: 9 mL via INTRAVENOUS
  Filled 2021-10-14: qty 10

## 2021-10-14 MED ORDER — PANTOPRAZOLE SODIUM 40 MG IV SOLR
40.0000 mg | Freq: Once | INTRAVENOUS | Status: AC
  Administered 2021-10-14: 40 mg via INTRAVENOUS
  Filled 2021-10-14: qty 10

## 2021-10-14 MED ORDER — OXYCODONE HCL 5 MG PO TABS: 5.0000 mg | ORAL_TABLET | ORAL | Status: DC | PRN

## 2021-10-14 MED ORDER — ONDANSETRON HCL 4 MG/2ML IJ SOLN
4.0000 mg | Freq: Four times a day (QID) | INTRAMUSCULAR | Status: DC | PRN
  Administered 2021-10-14: 4 mg via INTRAVENOUS
  Filled 2021-10-14: qty 2

## 2021-10-14 MED ORDER — MORPHINE SULFATE (PF) 2 MG/ML IV SOLN
2.0000 mg | INTRAVENOUS | Status: DC | PRN
  Administered 2021-10-14: 2 mg via INTRAVENOUS
  Filled 2021-10-14: qty 1

## 2021-10-14 MED ORDER — LORAZEPAM 0.5 MG PO TABS: 0.5000 mg | ORAL_TABLET | Freq: Four times a day (QID) | ORAL | Status: DC | PRN

## 2021-10-14 MED ORDER — ALBUTEROL SULFATE (2.5 MG/3ML) 0.083% IN NEBU: 2.5000 mg | INHALATION_SOLUTION | Freq: Four times a day (QID) | RESPIRATORY_TRACT | Status: DC | PRN

## 2021-10-14 MED ORDER — IOHEXOL 300 MG/ML  SOLN
100.0000 mL | Freq: Once | INTRAMUSCULAR | Status: AC | PRN
  Administered 2021-10-14: 100 mL via INTRAVENOUS
  Filled 2021-10-14: qty 100

## 2021-10-14 MED ORDER — INSULIN ASPART 100 UNIT/ML IJ SOLN
0.0000 [IU] | Freq: Three times a day (TID) | INTRAMUSCULAR | Status: DC
  Administered 2021-10-14 – 2021-10-15 (×2): 2 [IU] via SUBCUTANEOUS
  Administered 2021-10-15: 3 [IU] via SUBCUTANEOUS
  Filled 2021-10-14 (×3): qty 1

## 2021-10-14 MED ORDER — LACTATED RINGERS IV BOLUS
1000.0000 mL | Freq: Once | INTRAVENOUS | Status: AC
  Administered 2021-10-14: 1000 mL via INTRAVENOUS

## 2021-10-14 MED ORDER — PRAVASTATIN SODIUM 20 MG PO TABS
20.0000 mg | ORAL_TABLET | Freq: Every day | ORAL | Status: DC
  Administered 2021-10-14 – 2021-10-15 (×2): 20 mg via ORAL
  Filled 2021-10-14 (×2): qty 1

## 2021-10-14 MED ORDER — POTASSIUM CHLORIDE 10 MEQ/100ML IV SOLN
10.0000 meq | Freq: Once | INTRAVENOUS | Status: AC
  Administered 2021-10-14: 10 meq via INTRAVENOUS
  Filled 2021-10-14: qty 100

## 2021-10-14 MED ORDER — HYDRALAZINE HCL 25 MG PO TABS: 25.0000 mg | ORAL_TABLET | Freq: Four times a day (QID) | ORAL | Status: DC | PRN

## 2021-10-14 NOTE — H&P (Signed)
?History and Physical  ? ? ?Pam Snyder WEX:[id number] DOB: 1965/07/10 DOA: 10/14/2021 ? ?PCP: Remi Haggard, FNP (Confirm with patient/family/NH records and if not entered, this has to be entered at [facility] point of entry) ?Patient coming from: Home ? ?I have personally briefly reviewed patient's old medical records in [place] ? ?Chief Complaint: Stomach hurts ? ?HPI: Pam Snyder is a 56 y.o. female with medical history significant of Recently diagnosed HTN, IIDM, HLD, ASD, stroke, MI, sigmoid volvulus, sickle cell trait, presented with new onset of epigastric pain. ? ?Patient woke up today with cramping-like epigastric pain, radiating to lower chest, associated with nauseous vomiting stomach content nonbloody nonbilious for 5-7 times.  No diarrhea this morning however after came to ED patient had 2 loose> diarrhea.  Denies any tenesmus.  Epigastric pain has been constant, cramping-like, 10/10.  She denies any history of drug use or alcohol use.  She has no history of pancreatitis before.  She claims that she has a uterine cyst.  1 month ago, patient developed bilateral lower extremity pitting edema, about 3 weeks ago patient was started on HCTZ 25 mg and the swelling resolved.  She was also started on Primacor at the same time. ? ?ED Course: No tachycardia no hypotension afebrile. ?Lipase 634, CT abdomen pelvis with contrast showed no significant acute changes on pancreas or hepatobiliary no CBD dilatation.:  Image compatible with colitis. ? ?WBC 7.6, creatinine 0.8, K3.4.  Glucose 212. ? ?Review of Systems: As per HPI otherwise 14 point review of systems negative.  ? ? ?Past Medical History:  ?Diagnosis Date  ? Abdominal pain   ? Anemia   ? Arthritis   ? hips, legs, arms  ? 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  ? Overview:  1. Amplatzer closure in 2006  ? Car sickness   ? Chest wall pain 01/28/2016  ? DDD (degenerative disc disease), cervical 01/12/2017  ? 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  ?  Headache   ? Migraines  ? Internal hernia   ? MI (myocardial infarction) ([place])   ? Mitral valve prolapse   ? Ovarian cyst   ? Plantar fasciitis   ? Sickle cell trait ([place])   ? Sigmoid volvulus ([place]) 01/04/2017  ? Stroke [facility])   ? 15 years ago, no deficits  ? ? ?Past Surgical History:  ?Procedure Laterality Date  ? CARDIAC CATHETERIZATION    ? x3 last in 2014, device implanted to help MVP  ? ENDOMETRIAL ABLATION  2014  ? [place]  ? EPIGASTRIC HERNIA REPAIR  01/04/2017  ? Procedure: HERNIA REPAIR;  Surgeon: Jules Husbands, MD;  Location: [facility] ORS;  Service: General;;  ? fibroid cyst    ? LAPAROTOMY N/A 01/04/2017  ? Procedure: EXPLORATORY LAPAROTOMY;  Surgeon: Jules Husbands, MD;  Location: [facility] ORS;  Service: General;  Laterality: N/A;  ? LYSIS OF ADHESION  01/04/2017  ? Procedure: LYSIS OF ADHESION;  Surgeon: Jules Husbands, MD;  Location: [facility] ORS;  Service: General;;  ? ? ? reports that she has never smoked. She has never used smokeless tobacco. She reports that she does not drink alcohol and does not use drugs. ? ?Allergies  ?Allergen Reactions  ? No Known Allergies   ? ? ?Family History  ?Problem Relation Age of Onset  ? Lung cancer Mother   ? Leukemia Father   ? Sickle cell anemia Daughter   ? Breast cancer Maternal Aunt 71  ? Breast cancer Maternal Aunt 1  ? Colon cancer  Maternal Grandmother   ? Sickle cell anemia Brother   ? ? ? ?Prior to Admission medications   ?Medication Sig Start Date End Date Taking? Authorizing Provider  ?hydrochlorothiazide (HYDRODIURIL) 25 MG tablet Take 25 mg by mouth daily. 09/23/21  Yes [provider]  ?metFORMIN (GLUCOPHAGE) 500 MG tablet Take 500 mg by mouth 2 (two) times daily. 09/23/21  Yes [provider]  ?albuterol (VENTOLIN HFA) 108 (90 Base) MCG/ACT inhaler Inhale 2 puffs into the lungs every 6 (six) hours as needed for wheezing or shortness of breath. 05/22/21   Rudene Re, MD  ?azithromycin (ZITHROMAX) 250 MG tablet Take 1 a day for 4 days ?Patient not  taking: Reported on 10/14/2021 05/22/21   Rudene Re, MD  ?chlorthalidone (HYGROTON) 25 MG tablet Take 1 tablet (25 mg total) by mouth daily. ?Patient not taking: Reported on 07/15/2020 04/06/18   Epifanio Lesches, MD  ?ondansetron (ZOFRAN-ODT) 4 MG disintegrating tablet Take 1 tablet (4 mg total) by mouth every 8 (eight) hours as needed for nausea or vomiting. ?Patient not taking: Reported on 07/15/2020 06/18/20   Cuthriell, Charline Bills, PA-C  ?polyethylene glycol (MIRALAX / GLYCOLAX) packet Take 17 g by mouth every other day. ?Patient not taking: Reported on 07/15/2020    [provider]  ?pravastatin (PRAVACHOL) 20 MG tablet Take 20 mg by mouth daily. 09/23/21   [provider]  ? ? ?Physical Exam: ?Vitals:  ? 10/14/21 0633 10/14/21 0641 10/14/21 1103  ?BP: 134/75  140/77  ?Pulse: 93  79  ?Resp: 20  20  ?Temp: 98.3 ?F (36.8 ?C)    ?TempSrc: Oral    ?SpO2: 97%  98%  ?Weight:  93 kg   ?Height:  '5\' 8"'$  (1.727 m)   ? ? ?Constitutional: NAD, calm, comfortable ?Vitals:  ? 10/14/21 8416 10/14/21 0641 10/14/21 1103  ?BP: 134/75  140/77  ?Pulse: 93  79  ?Resp: 20  20  ?Temp: 98.3 ?F (36.8 ?C)    ?TempSrc: Oral    ?SpO2: 97%  98%  ?Weight:  93 kg   ?Height:  '5\' 8"'$  (1.727 m)   ? ?Eyes: PERRL, lids and conjunctivae normal ?ENMT: Mucous membranes are moist. Posterior pharynx clear of any exudate or lesions.Normal dentition.  ?Neck: normal, supple, no masses, no thyromegaly ?Respiratory: clear to auscultation bilaterally, no wheezing, no crackles. Normal respiratory effort. No accessory muscle use.  ?Cardiovascular: Regular rate and rhythm, no murmurs / rubs / gallops. No extremity edema. 2+ pedal pulses. No carotid bruits.  ?Abdomen: Mild tenderness on deep palpation on epigastric area, no rebound no guarding, no masses palpated. No hepatosplenomegaly. Bowel sounds positive.  ?Musculoskeletal: no clubbing / cyanosis. No joint deformity upper and lower extremities. Good ROM, no contractures. Normal muscle  tone.  ?Skin: no rashes, lesions, ulcers. No induration ?Neurologic: CN 2-12 grossly intact. Sensation intact, DTR normal. Strength 5/5 in all 4.  ?Psychiatric: Normal judgment and insight. Alert and oriented x 3. Normal mood.  ? ? ?Labs on Admission: I have personally reviewed following labs and imaging studies ? ?CBC: ?Recent Labs  ?Lab 10/14/21 ?6063  ?WBC 7.6  ?HGB 13.1  ?HCT 41.3  ?MCV 73.4*  ?PLT 236  ? ?Basic Metabolic Panel: ?Recent Labs  ?Lab 10/14/21 ?0160  ?NA 139  ?K 3.4*  ?CL 109  ?CO2 22  ?GLUCOSE 212*  ?BUN 15  ?CREATININE 0.81  ?CALCIUM 9.5  ?MG 1.9  ? ?GFR: ?Estimated Creatinine Clearance: 94.6 mL/min (by C-G formula based on SCr of 0.81 mg/dL). ?Liver  Function Tests: ?Recent Labs  ?Lab 10/14/21 ?1655  ?AST 22  ?ALT 19  ?ALKPHOS 89  ?BILITOT 0.3  ?PROT 7.0  ?ALBUMIN 3.9  ? ?Recent Labs  ?Lab 10/14/21 ?3748  ?LIPASE 634*  ? ?No results for input(s): AMMONIA in the last 168 hours. ?Coagulation Profile: ?No results for input(s): INR, PROTIME in the last 168 hours. ?Cardiac Enzymes: ?No results for input(s): CKTOTAL, CKMB, CKMBINDEX, TROPONINI in the last 168 hours. ?BNP (last 3 results) ?No results for input(s): PROBNP in the last 8760 hours. ?HbA1C: ?No results for input(s): HGBA1C in the last 72 hours. ?CBG: ?No results for input(s): GLUCAP in the last 168 hours. ?Lipid Profile: ?Recent Labs  ?  10/14/21 ?1024  ?TRIG 41  ? ?Thyroid Function Tests: ?No results for input(s): TSH, T4TOTAL, FREET4, T3FREE, THYROIDAB in the last 72 hours. ?Anemia Panel: ?No results for input(s): VITAMINB12, FOLATE, FERRITIN, TIBC, IRON, RETICCTPCT in the last 72 hours. ?Urine analysis: ?   ?Component Value Date/Time  ? COLORURINE YELLOW (A) 10/14/2021 1109  ? APPEARANCEUR CLEAR (A) 10/14/2021 1109  ? APPEARANCEUR Clear 01/01/2014 1434  ? LABSPEC >1.046 (H) 10/14/2021 1109  ? LABSPEC 1.017 01/01/2014 1434  ? PHURINE 5.0 10/14/2021 1109  ? GLUCOSEU NEGATIVE 10/14/2021 1109  ? GLUCOSEU Negative 01/01/2014 1434  ? HGBUR  NEGATIVE 10/14/2021 1109  ? BILIRUBINUR NEGATIVE 10/14/2021 1109  ? BILIRUBINUR Negative 01/01/2014 1434  ? [place] NEGATIVE 10/14/2021 1109  ? PROTEINUR NEGATIVE 10/14/2021 1109  ? NITRITE NEGATIVE 05/09/202

## 2021-10-14 NOTE — ED Provider Notes (Signed)
? ?[facility] ?Provider Note ? ? ? Event Date/Time  ? First MD Initiated Contact with Patient 10/14/21 [phone]   ?  (approximate) ? ? ?History  ? ?Chief Complaint ?Abdominal Pain ? ? ?HPI ?Pam Snyder is a 56 y.o. female, brought in by EMS, history of stroke, MI, sigmoid volvulus, sickle cell trait, anemia, presents to the emergency department for evaluation of abdominal pain.  Patient states that she was driving to work today when she felt a gradual onset of significant abdominal pain in the epigastric region, as well as left-sided lower chest pain.  Described as a sharp/crampy sensation, 10/10, radiates into the lower abdomen.  She states that she has had 15 episodes of vomiting since this morning.  Denies any other recent illnesses or injuries.  Denies fever/chills, shortness of breath, flank pain, urinary symptoms, diarrhea, blood in vomit, blood in stool, headache, rash/lesions, or dizziness/lightheadedness. ? ?History Limitations: No limitations. ? ?    ? ? ?Physical Exam  ?Triage Vital Signs: ?ED Triage Vitals  ?Enc Vitals Group  ?   BP 10/14/21 0633 134/75  ?   Pulse Rate 10/14/21 0633 93  ?   Resp 10/14/21 0633 20  ?   Temp 10/14/21 0633 98.3 ?F (36.8 ?C)  ?   Temp Source 10/14/21 0633 Oral  ?   SpO2 10/14/21 0633 97 %  ?   Weight 10/14/21 0641 205 lb (93 kg)  ?   Height 10/14/21 0641 '5\' 8"'$  (1.727 m)  ?   Head Circumference --   ?   Peak Flow --   ?   Pain Score 10/14/21 0641 10  ?   Pain Loc --   ?   Pain Edu? --   ?   Excl. in [place]? --   ? ? ?Most recent vital signs: ?Vitals:  ? 10/14/21 0633 10/14/21 1103  ?BP: 134/75 140/77  ?Pulse: 93 79  ?Resp: 20 20  ?Temp: 98.3 ?F (36.8 ?C)   ?SpO2: 97% 98%  ? ? ?General: Awake, appears uncomfortable and in pain ?Skin: Warm, dry. No rashes or lesions.  ?Eyes: PERRL. Conjunctivae normal.  ?CV: Good peripheral perfusion.  ?Resp: Normal effort.  Lung sounds are clear bilaterally in the apices and bases. ?Abd: Soft, moderate tenderness appreciated  when palpating the epigastric region. ?Neuro: At baseline. No gross neurological deficits.  ? ?Focused Exam: Negative rebound tenderness. ? ?Physical Exam ? ? ? ?ED Results / Procedures / Treatments  ?Labs ?(all labs ordered are listed, but only abnormal results are displayed) ?Labs Reviewed  ?LIPASE, BLOOD - Abnormal; Notable for the following components:  ?    Result Value  ? Lipase 634 (*)   ? All other components within normal limits  ?COMPREHENSIVE METABOLIC PANEL - Abnormal; Notable for the following components:  ? Potassium 3.4 (*)   ? Glucose, Bld 212 (*)   ? All other components within normal limits  ?CBC - Abnormal; Notable for the following components:  ? RBC 5.63 (*)   ? MCV 73.4 (*)   ? MCH 23.3 (*)   ? All other components within normal limits  ?URINALYSIS, ROUTINE W REFLEX MICROSCOPIC - Abnormal; Notable for the following components:  ? Color, Urine YELLOW (*)   ? APPearance CLEAR (*)   ? Specific Gravity, Urine >1.046 (*)   ? All other components within normal limits  ?TRIGLYCERIDES  ?MAGNESIUM  ?TROPONIN I (HIGH SENSITIVITY)  ?TROPONIN I (HIGH SENSITIVITY)  ? ? ? ?EKG ?Sinus rhythm, rate  of 89, no significant ST segment changes, no AV blocks, no axis deviations, mild QT prolongation. ? ? ?RADIOLOGY ? ?ED Provider Interpretation: I personally reviewed and interpreted these images.  Chest x-ray shows no acute pathology.  CT abdomen does not show any acute pathology. ? ?DG Chest 2 View ? ?Result Date: 10/14/2021 ?CLINICAL DATA:  Chest pain EXAM: CHEST - 2 VIEW COMPARISON:  05/21/2021 FINDINGS: Artifact from EKG pads. Normal heart size and mediastinal contours. Atrial septal implant. No acute infiltrate or edema. No effusion or pneumothorax. No acute osseous findings. IMPRESSION: No active cardiopulmonary disease. Electronically Signed   By: Jorje Guild M.D.   On: 10/14/2021 07:31  ? ?CT Abdomen Pelvis W Contrast ? ?Result Date: 10/14/2021 ?CLINICAL DATA:  Pancreatitis, acute, severe. Triage note: To  triage via [facility] with c/o abd pain, vomiting green emesis and chest pain. States chest pain began at same time as abd pain. EXAM: CT ABDOMEN AND PELVIS WITH CONTRAST TECHNIQUE: Multidetector CT imaging of the abdomen and pelvis was performed using the standard protocol following bolus administration of intravenous contrast. RADIATION DOSE REDUCTION: This exam was performed according to the departmental dose-optimization program which includes automated exposure control, adjustment of the mA and/or kV according to patient size and/or use of iterative reconstruction technique. CONTRAST:  124m OMNIPAQUE IOHEXOL 300 MG/ML  SOLN COMPARISON:  07/04/2020 FINDINGS: Lower chest: Unremarkable. Hepatobiliary: Tiny hypodensities in the liver are too small to characterize but stable in the interval consistent with benign etiology. No followup recommended. There is no evidence for gallstones, gallbladder wall thickening, or pericholecystic fluid. No intrahepatic or extrahepatic biliary dilation. Pancreas: No focal mass lesion. No dilatation of the main duct. No intraparenchymal cyst. No peripancreatic edema. Spleen: No splenomegaly. No focal mass lesion. Adrenals/Urinary Tract: No adrenal nodule or mass. Right kidney unremarkable. Tiny cyst interpolar left kidney stable in the interval. No followup recommended. No evidence for hydroureter. The urinary bladder appears normal for the degree of distention. Stomach/Bowel: Stomach is unremarkable. No gastric wall thickening. No evidence of outlet obstruction. Duodenum is normally positioned as is the ligament of Treitz. No small bowel wall thickening. No small bowel dilatation. The terminal ileum is normal. The appendix is normal. No gross colonic mass. No colonic wall thickening. Fluid in the colonic lumen extends to the rectum, imaging features that could be compatible with clinical diarrhea. Vascular/Lymphatic: No abdominal aortic aneurysm. No abdominal aortic atherosclerotic  calcification. There is no gastrohepatic or hepatoduodenal ligament lymphadenopathy. No retroperitoneal or mesenteric lymphadenopathy. No pelvic sidewall lymphadenopathy. Reproductive: Uterine fibroids evident.  There is no adnexal mass. Other: No intraperitoneal free fluid. Musculoskeletal: No worrisome lytic or sclerotic osseous abnormality. Small supraumbilical midline ventral hernia contains only fat. IMPRESSION: 1. No CT evidence for acute pancreatitis. 2. Fluid in the colonic lumen extends to the rectum, imaging features that could be compatible with clinical diarrhea. 3. Uterine fibroids. Electronically Signed   By: EMisty StanleyM.D.   On: 10/14/2021 09:24   ? ?PROCEDURES: ? ?Critical Care performed: None ? ?Procedures ? ? ? ?MEDICATIONS ORDERED IN ED: ?Medications  ?HYDROmorphone (DILAUDID) injection 1 mg (1 mg Intravenous Given 10/14/21 0823)  ?ondansetron ([facility] injection 4 mg (4 mg Intravenous Given 10/14/21 08841  ?lactated ringers bolus 1,000 mL (1,000 mLs Intravenous New Bag/Given 10/14/21 0821)  ?iohexol (OMNIPAQUE) 300 MG/ML solution 100 mL (100 mLs Intravenous Contrast Given 10/14/21 0900)  ?pantoprazole (PROTONIX) injection 40 mg (40 mg Intravenous Given 10/14/21 1106)  ?ketorolac (TORADOL) 15 MG/ML injection 15 mg (15 mg  Intravenous Given 10/14/21 1104)  ?potassium chloride 10 mEq in 100 mL IVPB (0 mEq Intravenous Stopped 10/14/21 1222)  ? ? ? ?IMPRESSION / MDM / ASSESSMENT AND PLAN / ED COURSE  ?I reviewed the triage vital signs and the nursing notes. ?             ?               ? ?Differential diagnosis includes, but is not limited to, pancreatitis, cholecystitis, cholangitis, ACS, colitis, diverticulitis, gastritis, esophagitis. ? ?ED Course ?Patient appears uncomfortable, but stable.  Vitals within normal limits.  Currently afebrile.  We will go ahead treat with ondansetron and hydromorphone. ? ?BC shows no leukocytosis or anemia. ? ?CMP notable for mild hypokalemia at 3.4 and elevated glucose at 212,  otherwise unremarkable. ? ?Lipase significantly elevated at 634.  Suggestive of pancreatitis. ? ?EKG unremarkable.  Initial troponin 5.  Second troponin 7.  Unlikely ACS. ? ?Assessment/Plan ?Patient presen

## 2021-10-14 NOTE — ED Triage Notes (Signed)
To triage via [facility] with c/o abd pain, vomiting green emesis and chest pain. States chest pain began at same time as abd pain. Given '4mg'$  Zofran by EMS with some improvement.  ?

## 2021-10-15 ENCOUNTER — Inpatient Hospital Stay: Payer: Medicaid Other

## 2021-10-15 DIAGNOSIS — I10 Essential (primary) hypertension: Secondary | ICD-10-CM

## 2021-10-15 DIAGNOSIS — K8500 Idiopathic acute pancreatitis without necrosis or infection: Secondary | ICD-10-CM

## 2021-10-15 DIAGNOSIS — E785 Hyperlipidemia, unspecified: Secondary | ICD-10-CM

## 2021-10-15 DIAGNOSIS — Q2111 Secundum atrial septal defect: Secondary | ICD-10-CM

## 2021-10-15 DIAGNOSIS — R1013 Epigastric pain: Secondary | ICD-10-CM

## 2021-10-15 LAB — COMPREHENSIVE METABOLIC PANEL
ALT: 16 U/L (ref 0–44)
AST: 15 U/L (ref 15–41)
Albumin: 3.2 g/dL — ABNORMAL LOW (ref 3.5–5.0)
Alkaline Phosphatase: 61 U/L (ref 38–126)
Anion gap: 3 — ABNORMAL LOW (ref 5–15)
BUN: 11 mg/dL (ref 6–20)
CO2: 28 mmol/L (ref 22–32)
Calcium: 8.5 mg/dL — ABNORMAL LOW (ref 8.9–10.3)
Chloride: 111 mmol/L (ref 98–111)
Creatinine, Ser: 0.81 mg/dL (ref 0.44–1.00)
GFR, Estimated: >60 mL/min (ref >60)
Glucose, Bld: 134 mg/dL — ABNORMAL HIGH (ref 70–99)
Potassium: 3.8 mmol/L (ref 3.5–5.1)
Sodium: 142 mmol/L (ref 135–145)
Total Bilirubin: 0.5 mg/dL (ref 0.3–1.2)
Total Protein: 5.8 g/dL — ABNORMAL LOW (ref 6.5–8.1)

## 2021-10-15 LAB — GLUCOSE, CAPILLARY
Glucose-Capillary: 141 mg/dL — ABNORMAL HIGH (ref 70–99)
Glucose-Capillary: 199 mg/dL — ABNORMAL HIGH (ref 70–99)

## 2021-10-15 LAB — HEMOGLOBIN A1C
Hgb A1c MFr Bld: 7.6 % — ABNORMAL HIGH (ref 4.8–5.6)
Mean Plasma Glucose: 171.42 mg/dL

## 2021-10-15 LAB — HIV ANTIBODY (ROUTINE TESTING W REFLEX): HIV Screen 4th Generation wRfx: NONREACTIVE

## 2021-10-15 LAB — LIPASE, BLOOD
Lipase: 61 U/L — ABNORMAL HIGH (ref 11–51)
Lipase: 40 U/L (ref 11–51)

## 2021-10-15 MED ORDER — HYDROCOD POLI-CHLORPHE POLI ER 10-8 MG/5ML PO SUER: 5.0000 mL | Freq: Two times a day (BID) | ORAL | Status: DC | PRN

## 2021-10-15 MED ORDER — BENZONATATE 100 MG PO CAPS
200.0000 mg | ORAL_CAPSULE | Freq: Three times a day (TID) | ORAL | Status: DC | PRN
  Administered 2021-10-15: 200 mg via ORAL
  Filled 2021-10-15: qty 2

## 2021-10-15 NOTE — Plan of Care (Signed)
?  Problem: Education: ?Goal: Knowledge of General Education information will improve ?Description: Including pain rating scale, medication(s)/side effects and non-pharmacologic comfort measures ?Outcome: Progressing ?  ?Problem: Health Behavior/Discharge Planning: ?Goal: Ability to manage health-related needs will improve ?Outcome: Progressing ?  ?Problem: Clinical Measurements: ?Goal: Ability to maintain clinical measurements within normal limits will improve ?Outcome: Progressing ?  ?Problem: Clinical Measurements: ?Goal: Will remain free from infection ?Outcome: Progressing ?  ?Problem: Clinical Measurements: ?Goal: Diagnostic test results will improve ?Outcome: Progressing ?  ?Problem: Clinical Measurements: ?Goal: Respiratory complications will improve ?Outcome: Progressing ?  ?Problem: Clinical Measurements: ?Goal: Cardiovascular complication will be avoided ?Outcome: Progressing ?  ?Problem: Pain Managment: ?Goal: General experience of comfort will improve ?Outcome: Progressing ?  ?Problem: Safety: ?Goal: Ability to remain free from injury will improve ?Outcome: Progressing ?  ?Problem: Skin Integrity: ?Goal: Risk for impaired skin integrity will decrease ?Outcome: Progressing ?  ?

## 2021-10-15 NOTE — Plan of Care (Signed)
?  Problem: Education: ?Goal: Knowledge of General Education information will improve ?Description: Including pain rating scale, medication(s)/side effects and non-pharmacologic comfort measures ?10/15/2021 1735 by Alferd Apa, RN ?Outcome: Adequate for Discharge ?10/15/2021 0810 by Alferd Apa, RN ?Outcome: Progressing ?  ?Problem: Health Behavior/Discharge Planning: ?Goal: Ability to manage health-related needs will improve ?10/15/2021 1735 by Alferd Apa, RN ?Outcome: Adequate for Discharge ?10/15/2021 0810 by Alferd Apa, RN ?Outcome: Progressing ?  ?Problem: Clinical Measurements: ?Goal: Ability to maintain clinical measurements within normal limits will improve ?10/15/2021 1735 by Alferd Apa, RN ?Outcome: Adequate for Discharge ?10/15/2021 0810 by Alferd Apa, RN ?Outcome: Progressing ?Goal: Will remain free from infection ?10/15/2021 1735 by Alferd Apa, RN ?Outcome: Adequate for Discharge ?10/15/2021 0810 by Alferd Apa, RN ?Outcome: Progressing ?Goal: Diagnostic test results will improve ?10/15/2021 1735 by Alferd Apa, RN ?Outcome: Adequate for Discharge ?10/15/2021 0810 by Alferd Apa, RN ?Outcome: Progressing ?Goal: Respiratory complications will improve ?10/15/2021 1735 by Alferd Apa, RN ?Outcome: Adequate for Discharge ?10/15/2021 0810 by Alferd Apa, RN ?Outcome: Progressing ?Goal: Cardiovascular complication will be avoided ?10/15/2021 1735 by Alferd Apa, RN ?Outcome: Adequate for Discharge ?10/15/2021 0810 by Alferd Apa, RN ?Outcome: Progressing ?  ?Problem: Activity: ?Goal: Risk for activity intolerance will decrease ?10/15/2021 1735 by Alferd Apa, RN ?Outcome: Adequate for Discharge ?10/15/2021 0810 by Alferd Apa, RN ?Outcome: Progressing ?  ?Problem: Nutrition: ?Goal: Adequate nutrition will be maintained ?10/15/2021 1735 by Alferd Apa, RN ?Outcome: Adequate for Discharge ?10/15/2021 0810 by Alferd Apa, RN ?Outcome: Progressing ?  ?Problem: Coping: ?Goal: Level of anxiety  will decrease ?10/15/2021 1735 by Alferd Apa, RN ?Outcome: Adequate for Discharge ?10/15/2021 0810 by Alferd Apa, RN ?Outcome: Progressing ?  ?Problem: Elimination: ?Goal: Will not experience complications related to bowel motility ?10/15/2021 1735 by Alferd Apa, RN ?Outcome: Adequate for Discharge ?10/15/2021 0810 by Alferd Apa, RN ?Outcome: Progressing ?Goal: Will not experience complications related to urinary retention ?10/15/2021 1735 by Alferd Apa, RN ?Outcome: Adequate for Discharge ?10/15/2021 0810 by Alferd Apa, RN ?Outcome: Progressing ?  ?Problem: Pain Managment: ?Goal: General experience of comfort will improve ?10/15/2021 1735 by Alferd Apa, RN ?Outcome: Adequate for Discharge ?10/15/2021 0810 by Alferd Apa, RN ?Outcome: Progressing ?  ?Problem: Safety: ?Goal: Ability to remain free from injury will improve ?10/15/2021 1735 by Alferd Apa, RN ?Outcome: Adequate for Discharge ?10/15/2021 0810 by Alferd Apa, RN ?Outcome: Progressing ?  ?Problem: Skin Integrity: ?Goal: Risk for impaired skin integrity will decrease ?10/15/2021 1735 by Alferd Apa, RN ?Outcome: Adequate for Discharge ?10/15/2021 0810 by Alferd Apa, RN ?Outcome: Progressing ?  ?

## 2021-10-15 NOTE — Plan of Care (Signed)

## 2021-10-15 NOTE — Discharge Summary (Signed)
?Physician Discharge Summary  ?Pam Snyder QBH:[id number] DOB: December 23, 1965 DOA: 10/14/2021 ? ?PCP: Remi Haggard, FNP ? ?Admit date: 10/14/2021 ?Discharge date: 10/15/2021 ? ?Admitted From: home ?Disposition:  home ? ?Recommendations for Outpatient Follow-up:  ?Follow up with PCP in 1-2 weeks ?Please obtain labs/tests: lipase, CMP.  ?Please address the following diagnoses: pancreatitis, hepatic steatosis, chest wall pain  ?Please follow up on the following pending results: none ? ?Home Health: none  ?Equipment/Devices: none ? ?Discharge Condition: good  ?CODE STATUS: FULL  ?Diet recommendation: full liquid to advance as tolerated over the next 5-7 days  ? ?Brief/Interim Summary: ?Pam Snyder is a 56 y.o. female with medical history significant of Recently diagnosed HTN, IIDM, HLD, ASD, stroke, MI, sigmoid volvulus, sickle cell trait, presented with new onset of epigastric pain. ?Patient woke up 05/09 with cramping-like epigastric pain, radiating to lower chest, associated with nauseous vomiting stomach content nonbloody nonbilious for 5-7 times.  No diarrhea however after came to ED patient had 2 loose diarrhea.  No tenesmus.  Epigastric pain constant, cramping-like, 10/10.  She denied any history of drug use or alcohol use.  She has no history of pancreatitis before.  She claims that she has a uterine cyst.  1 month ago, patient developed bilateral lower extremity pitting edema, about 3 weeks ago patient was started on HCTZ 25 mg and the swelling resolved.  She was also started on Primacor at the same time. ED Course: No tachycardia no hypotension afebrile. Lipase 634, CT abdomen pelvis with contrast showed no significant acute changes on pancreas or hepatobiliary no CBD dilatation.MRCP as below, mild pancreatitis, (+)hepatic steatosis. WBC 7.6, creatinine 0.8, K3.4.  Glucose 212. Pt treated w/ IV fluids, restricted diet. TG WNL. ?Improved by 05/10, lipase normalized, tolerating diet, CXR repeated d/t pt c/o  CP on cough and concern for pneumonia which she has had before, CXR WNL.  ? ?Discharge Diagnoses:  ?Principal Problem: ?  Pancreatitis ?Active Problems: ?  ASD secundum ?  Pancreatitis, acute ?  HTN (hypertension) ?  HLD (hyperlipidemia) ? ? ? ? ?Discharge Instructions ? ?Discharge Instructions   ? ? Diet full liquid   Complete by: As directed ?  ? Discharge instructions   Complete by: As directed ?  ? See attached instructions for full liquid diet and soft food diet. Continue full liquid diet for 1-2 more days, then soft diet for 1-2 days, then advance to solid foods. If you experience abdominal pain with heavier diet, please go back to the diet that you were tolerating, and if this doesn't help / if severe pain please seek medical care. Please make appointment to see your primary care office for a hospital follow up visit in 1-2 weeks.  ? Increase activity slowly   Complete by: As directed ?  ? ?  ? ?Allergies as of 10/15/2021   ? ?   Reactions  ? No Known Allergies   ? ?  ? ?  ?Medication List  ?  ? ?STOP taking these medications   ? ?azithromycin 250 MG tablet ?Commonly known as: ZITHROMAX ?  ?chlorthalidone 25 MG tablet ?Commonly known as: HYGROTON ?  ?ondansetron 4 MG disintegrating tablet ?Commonly known as: ZOFRAN-ODT ?  ?polyethylene glycol 17 g packet ?Commonly known as: MIRALAX / GLYCOLAX ?  ? ?  ? ?TAKE these medications   ? ?albuterol 108 (90 Base) MCG/ACT inhaler ?Commonly known as: VENTOLIN HFA ?Inhale 2 puffs into the lungs every 6 (six) hours as needed for wheezing or shortness  of breath. ?  ?hydrochlorothiazide 25 MG tablet ?Commonly known as: HYDRODIURIL ?Take 25 mg by mouth daily. ?  ?metFORMIN 500 MG tablet ?Commonly known as: GLUCOPHAGE ?Take 500 mg by mouth 2 (two) times daily. ?  ?pravastatin 20 MG tablet ?Commonly known as: PRAVACHOL ?Take 20 mg by mouth daily. ?  ? ?  ? ? ? ?Allergies  ?Allergen Reactions  ? No Known Allergies   ? ? ?Consultations: ?none ? ? ?Procedures/Studies: ?DG Chest 2  View ? ?Result Date: 10/14/2021 ?CLINICAL DATA:  Chest pain EXAM: CHEST - 2 VIEW COMPARISON:  05/21/2021 FINDINGS: Artifact from EKG pads. Normal heart size and mediastinal contours. Atrial septal implant. No acute infiltrate or edema. No effusion or pneumothorax. No acute osseous findings. IMPRESSION: No active cardiopulmonary disease. Electronically Signed   By: Jorje Guild M.D.   On: 10/14/2021 07:31  ? ?CT Abdomen Pelvis W Contrast ? ?Result Date: 10/14/2021 ?CLINICAL DATA:  Pancreatitis, acute, severe. Triage note: To triage via [facility] with c/o abd pain, vomiting green emesis and chest pain. States chest pain began at same time as abd pain. EXAM: CT ABDOMEN AND PELVIS WITH CONTRAST TECHNIQUE: Multidetector CT imaging of the abdomen and pelvis was performed using the standard protocol following bolus administration of intravenous contrast. RADIATION DOSE REDUCTION: This exam was performed according to the departmental dose-optimization program which includes automated exposure control, adjustment of the mA and/or kV according to patient size and/or use of iterative reconstruction technique. CONTRAST:  160m OMNIPAQUE IOHEXOL 300 MG/ML  SOLN COMPARISON:  07/04/2020 FINDINGS: Lower chest: Unremarkable. Hepatobiliary: Tiny hypodensities in the liver are too small to characterize but stable in the interval consistent with benign etiology. No followup recommended. There is no evidence for gallstones, gallbladder wall thickening, or pericholecystic fluid. No intrahepatic or extrahepatic biliary dilation. Pancreas: No focal mass lesion. No dilatation of the main duct. No intraparenchymal cyst. No peripancreatic edema. Spleen: No splenomegaly. No focal mass lesion. Adrenals/Urinary Tract: No adrenal nodule or mass. Right kidney unremarkable. Tiny cyst interpolar left kidney stable in the interval. No followup recommended. No evidence for hydroureter. The urinary bladder appears normal for the degree of distention.  Stomach/Bowel: Stomach is unremarkable. No gastric wall thickening. No evidence of outlet obstruction. Duodenum is normally positioned as is the ligament of Treitz. No small bowel wall thickening. No small bowel dilatation. The terminal ileum is normal. The appendix is normal. No gross colonic mass. No colonic wall thickening. Fluid in the colonic lumen extends to the rectum, imaging features that could be compatible with clinical diarrhea. Vascular/Lymphatic: No abdominal aortic aneurysm. No abdominal aortic atherosclerotic calcification. There is no gastrohepatic or hepatoduodenal ligament lymphadenopathy. No retroperitoneal or mesenteric lymphadenopathy. No pelvic sidewall lymphadenopathy. Reproductive: Uterine fibroids evident.  There is no adnexal mass. Other: No intraperitoneal free fluid. Musculoskeletal: No worrisome lytic or sclerotic osseous abnormality. Small supraumbilical midline ventral hernia contains only fat. IMPRESSION: 1. No CT evidence for acute pancreatitis. 2. Fluid in the colonic lumen extends to the rectum, imaging features that could be compatible with clinical diarrhea. 3. Uterine fibroids. Electronically Signed   By: EMisty StanleyM.D.   On: 10/14/2021 09:24  ? ?MR 3D Recon At Scanner ? ?Result Date: 10/14/2021 ?CLINICAL DATA:  Epigastric abdominal pain new onset concern for severe pancreatitis. EXAM: MRI ABDOMEN WITHOUT AND WITH CONTRAST (INCLUDING MRCP) TECHNIQUE: Multiplanar multisequence MR imaging of the abdomen was performed both before and after the administration of intravenous contrast. Heavily T2-weighted images of the biliary and pancreatic ducts were  obtained, and three-dimensional MRCP images were rendered by post processing. CONTRAST:  34m GADAVIST GADOBUTROL 1 MMOL/ML IV SOLN COMPARISON:  Same day CT of the abdomen and pelvis dated Oct 14, 2021 FINDINGS: Lower chest: No acute abnormality. Hepatobiliary: Mild diffuse hepatic steatosis. Bilobar nonenhancing fluid density hepatic  lesions measure up to 12 mm and are compatible with benign cysts. No solid enhancing hepatic lesion. Gallbladder is distended without cholelithiasis or gallbladder wall thickening. No biliary ductal dilati

## 2021-10-15 NOTE — Progress Notes (Signed)
Pre-rounds chart review and preliminary plan / goals for today: ?Significant overnight events per chart: none ?Significant new findings on labs/other diagnostics:  ?Lipase down from 634 --> 61 this AM ?Dispo plan: home ?Pending/Plan: clinical improvement  ? ?Please feel free to reach out via secure chat in Epic for non-urgent issues. Please page for urgent matters! ? ?This note will be updated after rounds to full progress note / discharge note.  ? ?

## 2021-10-15 NOTE — Assessment & Plan Note (Signed)
Lipase down from 634 --> 61 this AM ?If doing well, tolerating po may be able to d/c later today or tomorrow  ?

## 2021-12-23 ENCOUNTER — Encounter: Payer: Self-pay | Admitting: Emergency Medicine

## 2021-12-23 ENCOUNTER — Other Ambulatory Visit: Payer: Self-pay

## 2021-12-23 ENCOUNTER — Emergency Department: Payer: Medicaid Other

## 2021-12-23 ENCOUNTER — Emergency Department
Admission: EM | Admit: 2021-12-23 | Discharge: 2021-12-23 | Disposition: A | Discharge status: Home / Self Care | Payer: Medicaid Other | Attending: Emergency Medicine | Admitting: Emergency Medicine

## 2021-12-23 DIAGNOSIS — R079 Chest pain, unspecified: Secondary | ICD-10-CM | POA: Diagnosis present

## 2021-12-23 DIAGNOSIS — I10 Essential (primary) hypertension: Secondary | ICD-10-CM | POA: Diagnosis not present

## 2021-12-23 DIAGNOSIS — U071 COVID-19: Secondary | ICD-10-CM | POA: Diagnosis not present

## 2021-12-23 LAB — CBC WITH DIFFERENTIAL/PLATELET
Abs Immature Granulocytes: 0.01 10*3/uL (ref 0.00–0.07)
Basophils Absolute: 0 10*3/uL (ref 0.0–0.1)
Basophils Relative: 0 %
Eosinophils Absolute: 0 10*3/uL (ref 0.0–0.5)
Eosinophils Relative: 1 %
HCT: 38.1 % (ref 36.0–46.0)
Hemoglobin: 12 g/dL (ref 12.0–15.0)
Immature Granulocytes: 0 %
Lymphocytes Relative: 26 %
Lymphs Abs: 1 10*3/uL (ref 0.7–4.0)
MCH: 23.2 pg — ABNORMAL LOW (ref 26.0–34.0)
MCHC: 31.5 g/dL (ref 30.0–36.0)
MCV: 73.6 fL — ABNORMAL LOW (ref 80.0–100.0)
Monocytes Absolute: 0.4 10*3/uL (ref 0.1–1.0)
Monocytes Relative: 11 %
Neutro Abs: 2.3 10*3/uL (ref 1.7–7.7)
Neutrophils Relative %: 62 %
Platelets: 211 10*3/uL (ref 150–400)
RBC: 5.18 MIL/uL — ABNORMAL HIGH (ref 3.87–5.11)
RDW: 14.8 % (ref 11.5–15.5)
WBC: 3.7 10*3/uL — ABNORMAL LOW (ref 4.0–10.5)
nRBC: 0 % (ref 0.0–0.2)

## 2021-12-23 LAB — HEPATIC FUNCTION PANEL
ALT: 20 U/L (ref 0–44)
AST: 22 U/L (ref 15–41)
Albumin: 4.1 g/dL (ref 3.5–5.0)
Alkaline Phosphatase: 87 U/L (ref 38–126)
Bilirubin, Direct: <0.1 mg/dL (ref 0.0–0.2)
Total Bilirubin: 0.4 mg/dL (ref 0.3–1.2)
Total Protein: 7.3 g/dL (ref 6.5–8.1)

## 2021-12-23 LAB — D-DIMER, QUANTITATIVE: D-Dimer, Quant: 0.71 ug/mL-FEU — ABNORMAL HIGH (ref 0.00–0.50)

## 2021-12-23 LAB — BASIC METABOLIC PANEL
Anion gap: 7 (ref 5–15)
BUN: 10 mg/dL (ref 6–20)
CO2: 28 mmol/L (ref 22–32)
Calcium: 8.9 mg/dL (ref 8.9–10.3)
Chloride: 104 mmol/L (ref 98–111)
Creatinine, Ser: 1.02 mg/dL — ABNORMAL HIGH (ref 0.44–1.00)
GFR, Estimated: >60 mL/min (ref >60)
Glucose, Bld: 201 mg/dL — ABNORMAL HIGH (ref 70–99)
Potassium: 3.2 mmol/L — ABNORMAL LOW (ref 3.5–5.1)
Sodium: 139 mmol/L (ref 135–145)

## 2021-12-23 LAB — TROPONIN I (HIGH SENSITIVITY): Troponin I (High Sensitivity): 5 ng/L (ref <18)

## 2021-12-23 LAB — RESP PANEL BY RT-PCR (FLU A&B, COVID) ARPGX2
Influenza A by PCR: NEGATIVE
Influenza B by PCR: NEGATIVE
SARS Coronavirus 2 by RT PCR: POSITIVE — AB

## 2021-12-23 LAB — LIPASE, BLOOD: Lipase: 48 U/L (ref 11–51)

## 2021-12-23 MED ORDER — IOHEXOL 350 MG/ML SOLN
75.0000 mL | Freq: Once | INTRAVENOUS | Status: AC | PRN
  Administered 2021-12-23: 75 mL via INTRAVENOUS

## 2021-12-23 MED ORDER — ACETAMINOPHEN 325 MG PO TABS
650.0000 mg | ORAL_TABLET | Freq: Once | ORAL | Status: AC
  Administered 2021-12-23: 650 mg via ORAL
  Filled 2021-12-23: qty 2

## 2021-12-23 MED ORDER — BENZONATATE 100 MG PO CAPS: 100.0000 mg | ORAL_CAPSULE | Freq: Three times a day (TID) | ORAL | 0 refills | Status: DC | PRN

## 2021-12-23 MED ORDER — SODIUM CHLORIDE 0.9 % IV BOLUS
500.0000 mL | Freq: Once | INTRAVENOUS | Status: AC
  Administered 2021-12-23: 500 mL via INTRAVENOUS

## 2021-12-23 NOTE — Discharge Instructions (Signed)
Your CT scan was negative for blood clot.  Your chest x-ray does not show pneumonia.  You were found to have COVID-19.  As you know, you are highly contagious to others for up to 10 days.  Please stay out of work for at least 5 days, and then wear a mask at work for the next 5 days.  Please return for any new, worsening, or change in symptoms or other concerns.

## 2021-12-23 NOTE — ED Triage Notes (Signed)
Patient to ED via POV for cough and flu like symptoms x2 days. Patient states she currently being treated for bronchitis.

## 2021-12-23 NOTE — ED Notes (Signed)
Patient transported to X-ray 

## 2021-12-23 NOTE — ED Provider Triage Note (Signed)
Emergency Medicine Provider Triage Evaluation Note  Drenda Sobecki, a 56 y.o. female  was evaluated in triage.  Pt complains of cough and body aches.  Patient reports flexion for the last 2 days.  She is currently being treated for bronchitis by her PCP.  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  Review of Systems  Positive: Cough, body aches Negative: FCS  Physical Exam  BP (!) 143/82 (BP Location: Left Arm)   Pulse (!) 114   Temp 99.9 F (37.7 C) (Oral)   Resp 18   Wt 93 kg   SpO2 99%   BMI 31.17 kg/m  Gen:   Awake, no distress  NAD Resp:  Normal effort  MSK:   Moves extremities without difficulty  Other:    Medical Decision Making  Medically screening exam initiated at 6:35 PM.  Appropriate orders placed.  Robecca Fulgham was informed that the remainder of the evaluation will be completed by another provider, this initial triage assessment does not replace that evaluation, and the importance of remaining in the ED until their evaluation is complete.  Patient to the ED for evaluation of 2 days of cough and flulike symptoms.   Melvenia Needles, PA-C 12/23/21 1839

## 2021-12-23 NOTE — ED Provider Notes (Signed)
[facility] Provider Note    Event Date/Time   First MD Initiated Contact with Patient 12/23/21 1843     (approximate)   History   Cough   HPI  Pam Snyder is a 56 y.o. female with a past medical history of hypertension, hyperlipidemia, mitral valve prolapse who presents today for evaluation of cough, body aches, intermittent chest pain and cough for the past 2 days.  Patient reports that her granddaughter was recently sick with the same things, and patient reports that she nursed her granddaughter back to health and thinks that she got sick from her granddaughter.  She reports that she has a left-sided rib pain that is worse with coughing.  She reports that she has had a charley horse in her right calf starting yesterday, and it moved up into her right thigh.  She denies history of PE/DVT.  She reports that she has had all of her COVID vaccinations and has had COVID twice.  Patient Active Problem List   Diagnosis Date Noted   Pancreatitis, acute 10/14/2021   HTN (hypertension) 10/14/2021   HLD (hyperlipidemia) 10/14/2021   Pancreatitis 10/14/2021   Chest pain 04/04/2018   DDD (degenerative disc disease), cervical 01/12/2017   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   Sigmoid volvulus ([place]) 01/04/2017   Internal hernia    Abdominal pain    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   Chest wall pain 01/28/2016          Physical Exam   Triage Vital Signs: ED Triage Vitals  Enc Vitals Group     BP 12/23/21 1832 (!) 143/82     Pulse Rate 12/23/21 1832 (!) 114     Resp 12/23/21 1832 18     Temp 12/23/21 1832 99.9 F (37.7 C)     Temp Source 12/23/21 1832 Oral     SpO2 12/23/21 1832 99 %     Weight 12/23/21 1833 205 lb 0.4 oz (93 kg)     Height --      Head Circumference --      Peak Flow --      Pain Score 12/23/21 1833 10     Pain Loc --      Pain Edu? --      Excl. in [place]? --     Most recent vital signs: Vitals:   12/23/21 1953  12/23/21 2132  BP: 138/81 (!) 145/80  Pulse: 92 77  Resp: (!) 22 20  Temp: 99.6 F (37.6 C)   SpO2: 97% 100%    Physical Exam Vitals and nursing note reviewed.  Constitutional:      General: Awake and alert. No acute distress.    Appearance: Normal appearance. The patient is overweight.  HENT:     Head: Normocephalic and atraumatic.     Mouth: Mucous membranes are moist.  Eyes:     General: PERRL. Normal EOMs        Right eye: No discharge.        Left eye: No discharge.     Conjunctiva/sclera: Conjunctivae normal.  Cardiovascular:     Rate and Rhythm: Normal rate and regular rhythm.     Pulses: Normal pulses.     Heart sounds: Normal heart sounds Pulmonary:     Effort: Pulmonary effort is normal. No respiratory distress.  Able to speak easily in complete sentences.  Dry cough on exam.    Breath sounds: Normal breath sounds.  Abdominal:     Abdomen is soft.  There is no abdominal tenderness. No rebound or guarding. No distention. Musculoskeletal:        General: No swelling. Normal range of motion.     Cervical back: Normal range of motion and neck supple.  No lower extremity edema. Skin:    General: Skin is warm and dry.     Capillary Refill: Capillary refill takes less than 2 seconds.     Findings: No rash.  Neurological:     Mental Status: The patient is awake and alert.      ED Results / Procedures / Treatments   Labs (all labs ordered are listed, but only abnormal results are displayed) Labs Reviewed  RESP PANEL BY RT-PCR (FLU A&B, COVID) ARPGX2 - Abnormal; Notable for the following components:      Result Value   SARS Coronavirus 2 by RT PCR POSITIVE (*)    All other components within normal limits  CBC WITH DIFFERENTIAL/PLATELET - Abnormal; Notable for the following components:   WBC 3.7 (*)    RBC 5.18 (*)    MCV 73.6 (*)    MCH 23.2 (*)    All other components within normal limits  BASIC METABOLIC PANEL - Abnormal; Notable for the following  components:   Potassium 3.2 (*)    Glucose, Bld 201 (*)    Creatinine, Ser 1.02 (*)    All other components within normal limits  D-DIMER, QUANTITATIVE - Abnormal; Notable for the following components:   D-Dimer, Quant 0.71 (*)    All other components within normal limits  HEPATIC FUNCTION PANEL  LIPASE, BLOOD  TROPONIN I (HIGH SENSITIVITY)  TROPONIN I (HIGH SENSITIVITY)     EKG     RADIOLOGY I independently reviewed and interpreted imaging and agree with radiologists findings.     PROCEDURES:  Critical Care performed:   Procedures   MEDICATIONS ORDERED IN ED: Medications  sodium chloride 0.9 % bolus 500 mL (0 mLs Intravenous Stopped 12/23/21 2133)  acetaminophen (TYLENOL) tablet 650 mg (650 mg Oral Given 12/23/21 1941)  iohexol (OMNIPAQUE) 350 MG/ML injection 75 mL (75 mLs Intravenous Contrast Given 12/23/21 2044)     IMPRESSION / MDM / ASSESSMENT AND PLAN / ED COURSE  I reviewed the triage vital signs and the nursing notes.   Differential diagnosis includes, but is not limited to, COVID-19, pneumonia, bronchitis, acute coronary syndrome, pulmonary embolism, influenza.  Patient presents emergency department tachycardic to 114 with a temperature of 99.9 F.  She has normal oxygen saturation on room air and demonstrates no increased work of breathing.  COVID swab and chest x-ray ordered at triage.  COVID is positive.  Chest x-ray is without focal consolidation.  Labs obtained are overall reassuring with exception of an elevated D-dimer which was obtained given her left sided chest pain and her calf cramp.  Patient agreed to undergo CTA.  CTA was negative for pulmonary embolism.  Patient was reevaluated multiple times room maintained a normal oxygen saturation on room air.  She demonstrates no increased work of breathing.  Troponin was negative.  Given that she has had this discomfort for over 2 days, no indication for repeat troponin.  She feels comfortable going home.  We  discussed isolation instructions and return precautions.  She was given a work note.  We discussed return precautions and the importance of close outpatient follow-up.  Patient understands and agrees with plan.  Discharged in stable condition.   Patient's presentation is most consistent with acute complicated illness / injury requiring diagnostic  workup.     FINAL CLINICAL IMPRESSION(S) / ED DIAGNOSES   Final diagnoses:  [id number]     Rx / DC Orders   ED Discharge Orders          Ordered    benzonatate (TESSALON PERLES) 100 MG capsule  3 times daily PRN        12/23/21 2142             Note:  This document was prepared using Dragon voice recognition software and may include unintentional dictation errors.   Emeline Gins 12/23/21 2258    Arta Silence, MD 12/23/21 2318

## 2022-01-12 ENCOUNTER — Ambulatory Visit: Payer: Medicaid Other

## 2022-02-01 ENCOUNTER — Encounter: Payer: Self-pay | Admitting: Intensive Care

## 2022-02-01 ENCOUNTER — Emergency Department: Payer: Medicaid Other

## 2022-02-01 ENCOUNTER — Emergency Department
Admission: EM | Admit: 2022-02-01 | Discharge: 2022-02-01 | Disposition: A | Discharge status: Home / Self Care | Payer: Medicaid Other | Attending: Emergency Medicine | Admitting: Emergency Medicine

## 2022-02-01 ENCOUNTER — Other Ambulatory Visit: Payer: Self-pay

## 2022-02-01 DIAGNOSIS — K5900 Constipation, unspecified: Secondary | ICD-10-CM | POA: Insufficient documentation

## 2022-02-01 LAB — COMPREHENSIVE METABOLIC PANEL
ALT: 17 U/L (ref 0–44)
AST: 18 U/L (ref 15–41)
Albumin: 4.1 g/dL (ref 3.5–5.0)
Alkaline Phosphatase: 93 U/L (ref 38–126)
Anion gap: 10 (ref 5–15)
BUN: 13 mg/dL (ref 6–20)
CO2: 27 mmol/L (ref 22–32)
Calcium: 9.3 mg/dL (ref 8.9–10.3)
Chloride: 105 mmol/L (ref 98–111)
Creatinine, Ser: 0.78 mg/dL (ref 0.44–1.00)
GFR, Estimated: >60 mL/min (ref >60)
Glucose, Bld: 135 mg/dL — ABNORMAL HIGH (ref 70–99)
Potassium: 4.3 mmol/L (ref 3.5–5.1)
Sodium: 142 mmol/L (ref 135–145)
Total Bilirubin: 0.7 mg/dL (ref 0.3–1.2)
Total Protein: 7.5 g/dL (ref 6.5–8.1)

## 2022-02-01 LAB — URINALYSIS, ROUTINE W REFLEX MICROSCOPIC
Bilirubin Urine: NEGATIVE
Glucose, UA: NEGATIVE mg/dL
Hgb urine dipstick: NEGATIVE
Ketones, ur: NEGATIVE mg/dL
Leukocytes,Ua: NEGATIVE
Nitrite: NEGATIVE
Protein, ur: NEGATIVE mg/dL
Specific Gravity, Urine: 1.021 (ref 1.005–1.030)
pH: 6 (ref 5.0–8.0)

## 2022-02-01 LAB — CBC WITH DIFFERENTIAL/PLATELET
Abs Immature Granulocytes: 0.03 10*3/uL (ref 0.00–0.07)
Basophils Absolute: 0 10*3/uL (ref 0.0–0.1)
Basophils Relative: 0 %
Eosinophils Absolute: 0.1 10*3/uL (ref 0.0–0.5)
Eosinophils Relative: 1 %
HCT: 37.1 % (ref 36.0–46.0)
Hemoglobin: 11.6 g/dL — ABNORMAL LOW (ref 12.0–15.0)
Immature Granulocytes: 0 %
Lymphocytes Relative: 18 %
Lymphs Abs: 1.3 10*3/uL (ref 0.7–4.0)
MCH: 22.9 pg — ABNORMAL LOW (ref 26.0–34.0)
MCHC: 31.3 g/dL (ref 30.0–36.0)
MCV: 73.3 fL — ABNORMAL LOW (ref 80.0–100.0)
Monocytes Absolute: 0.6 10*3/uL (ref 0.1–1.0)
Monocytes Relative: 8 %
Neutro Abs: 5.2 10*3/uL (ref 1.7–7.7)
Neutrophils Relative %: 73 %
Platelets: 252 10*3/uL (ref 150–400)
RBC: 5.06 MIL/uL (ref 3.87–5.11)
RDW: 14.8 % (ref 11.5–15.5)
WBC: 7.1 10*3/uL (ref 4.0–10.5)
nRBC: 0 % (ref 0.0–0.2)

## 2022-02-01 LAB — POC URINE PREG, ED: Preg Test, Ur: NEGATIVE

## 2022-02-01 MED ORDER — IOHEXOL 300 MG/ML  SOLN
100.0000 mL | Freq: Once | INTRAMUSCULAR | Status: AC | PRN
  Administered 2022-02-01: 100 mL via INTRAVENOUS

## 2022-02-01 MED ORDER — LACTULOSE 10 GM/15ML PO SOLN
30.0000 g | Freq: Once | ORAL | Status: DC
  Filled 2022-02-01: qty 60

## 2022-02-01 MED ORDER — HYDROCORTISONE ACETATE 25 MG RE SUPP: 25.0000 mg | Freq: Two times a day (BID) | RECTAL | 0 refills | Status: AC

## 2022-02-01 MED ORDER — SODIUM CHLORIDE 0.9 % IV BOLUS: 1000.0000 mL | Freq: Once | INTRAVENOUS | Status: DC

## 2022-02-01 MED ORDER — POLYETHYLENE GLYCOL 3350 17 GM/SCOOP PO POWD: ORAL | 0 refills | Status: AC

## 2022-02-01 MED ORDER — BISACODYL 10 MG RE SUPP
10.0000 mg | Freq: Once | RECTAL | Status: DC
Start: 2022-02-01 — End: 2022-02-01
  Filled 2022-02-01: qty 1

## 2022-02-01 NOTE — ED Notes (Signed)
Attempted IV Start x2 no success.

## 2022-02-01 NOTE — ED Provider Triage Note (Signed)
Emergency Medicine Provider Triage Evaluation Note  Pam Snyder, a 56 y.o. female  was evaluated in triage.  Pt complains of rectal pain and pressure.  Patient reports 3 days of constipation noting that she been on the toilet straining 60 o'clock this morning.  She presents to the ED with acute pain, pressure, swelling to the rectum, with concern for hemorrhoids versus prolapse.  She gives remote history of SBO.  She denies any nausea, vomiting, feculent emesis, melena, or hematochezia.  She does report some BRBPR this morning.   Review of Systems  Positive: Rectal pain, constipation Negative: NV  Physical Exam  BP (!) 150/85 (BP Location: Right Arm)   Pulse 87   Temp 98.7 F (37.1 C) (Oral)   Resp 18   Ht 5' 8.5" (1.74 m)   Wt 85.7 kg   LMP  (LMP Unknown) Comment: June 2020  SpO2 99%   BMI 28.32 kg/m  Gen:   Awake, no distress   Resp:  Normal effort  MSK:   Moves extremities without difficulty  Other:  Soft, nontender  Medical Decision Making  Medically screening exam initiated at 12:20 PM.  Appropriate orders placed.  Reathel Turi was informed that the remainder of the evaluation will be completed by another provider, this initial triage assessment does not replace that evaluation, and the importance of remaining in the ED until their evaluation is complete.  Patient to the ED for evaluation of rectal pain and pressure.   Melvenia Needles, PA-C 02/01/22 1230

## 2022-02-01 NOTE — Discharge Instructions (Signed)
Your CT scan does not show any bowel obstruction or other acute issues in your abdomen.  Your symptoms appear to be due to constipation and hemorrhoids.  Use MiraLAX and Anusol as prescribed to alleviate the symptoms.

## 2022-02-01 NOTE — ED Provider Notes (Signed)
[facility] Provider Note    Event Date/Time   First MD Initiated Contact with Patient 02/01/22 1312     (approximate)   History   Rectal Pain   HPI  Pam Snyder is a 56 y.o. female with history of bowel obstruction who comes in with concerns for difficulty having a bowel movement as well as some rectal pain.  Patient reports some generalized discomfort in her abdomen with some difficulty having a bowel movement even after taking laxatives at home.  She now reports having a lot of pain near her rectum and some tissue exposure.  Denies any history of abscesses.   Physical Exam   Triage Vital Signs: ED Triage Vitals  Enc Vitals Group     BP 02/01/22 1217 (!) 150/85     Pulse Rate 02/01/22 1217 87     Resp 02/01/22 1217 18     Temp 02/01/22 1217 98.7 F (37.1 C)     Temp Source 02/01/22 1217 Oral     SpO2 02/01/22 1217 99 %     Weight 02/01/22 1218 189 lb (85.7 kg)     Height 02/01/22 1218 5' 8.5" (1.74 m)     Head Circumference --      Peak Flow --      Pain Score 02/01/22 1218 10     Pain Loc --      Pain Edu? --      Excl. in [place]? --     Most recent vital signs: Vitals:   02/01/22 1217  BP: (!) 150/85  Pulse: 87  Resp: 18  Temp: 98.7 F (37.1 C)  SpO2: 99%     General: Awake, no distress.  CV:  Good peripheral perfusion.  Resp:  Normal effort.  Abd:  No distention.  Soft and nontender Other:  Patient has tenderness around her rectum that appears that patient has an external hemorrhoid that is not thrombosed in nature.  Rectal exam without any stool ball noted ED Results / Procedures / Treatments   Labs (all labs ordered are listed, but only abnormal results are displayed) Labs Reviewed  COMPREHENSIVE METABOLIC PANEL - Abnormal; Notable for the following components:      Result Value   Glucose, Bld 135 (*)    All other components within normal limits  CBC WITH DIFFERENTIAL/PLATELET - Abnormal; Notable for the following  components:   Hemoglobin 11.6 (*)    MCV 73.3 (*)    MCH 22.9 (*)    All other components within normal limits  URINALYSIS, ROUTINE W REFLEX MICROSCOPIC - Abnormal; Notable for the following components:   Color, Urine YELLOW (*)    APPearance CLEAR (*)    All other components within normal limits  POC URINE PREG, ED  POC URINE PREG, ED     RADIOLOGY Ct pending   PROCEDURES:  Critical Care performed: No  Procedures   MEDICATIONS ORDERED IN ED: Medications - No data to display   IMPRESSION / MDM / [place] / ED COURSE  I reviewed the triage vital signs and the nursing notes.   Patient's presentation is most consistent with acute presentation with potential threat to life or bodily function.   Patient comes in with some abdominal discomfort constipation and concern for hemorrhoid.  Given patient's history obstruction recommend CT imaging to rule out any other kind of obstruction, abscess.  But on my examination I suspect that she has a small hemorrhoid.  Does not look thrombosed in  nature.  UA without evidence of UTI.  Pregnancy test negative.  CMP reassuring CBC reassuring.  Patient had off oncoming team pending CT    FINAL CLINICAL IMPRESSION(S) / ED DIAGNOSES   Final diagnoses:  Constipation, unspecified constipation type     Rx / DC Orders   ED Discharge Orders     None        Note:  This document was prepared using Dragon voice recognition software and may include unintentional dictation errors.   Vanessa Wittmann, MD 02/01/22 1534

## 2022-02-01 NOTE — ED Triage Notes (Addendum)
Patient reports constipation X3 days. Reports she has been straining on the toilet for hours and now can see skin hanging from rectum  HX bowel obstruction

## 2022-02-06 ENCOUNTER — Ambulatory Visit: Payer: Self-pay | Attending: Cardiology | Admitting: Cardiology

## 2022-02-06 ENCOUNTER — Ambulatory Visit: Payer: Medicaid Other | Admitting: Cardiology

## 2022-02-06 ENCOUNTER — Encounter: Payer: Self-pay | Admitting: Cardiology

## 2022-02-06 VITALS — BP 128/70 | HR 67 | Ht 68.5 in | Wt 200.0 lb

## 2022-02-06 DIAGNOSIS — I2510 Atherosclerotic heart disease of native coronary artery without angina pectoris: Secondary | ICD-10-CM

## 2022-02-06 DIAGNOSIS — Z8774 Personal history of (corrected) congenital malformations of heart and circulatory system: Secondary | ICD-10-CM

## 2022-02-06 DIAGNOSIS — R072 Precordial pain: Secondary | ICD-10-CM

## 2022-02-06 DIAGNOSIS — E7800 Pure hypercholesterolemia, unspecified: Secondary | ICD-10-CM

## 2022-02-06 MED ORDER — METOPROLOL TARTRATE 100 MG PO TABS: 100.0000 mg | ORAL_TABLET | Freq: Once | ORAL | 0 refills | Status: AC

## 2022-02-06 NOTE — Progress Notes (Signed)
Cardiology Office Note:    Date:  02/06/2022   ID:  Pam Snyder, DOB 04-03-1966, MRN [id number]  PCP:  Remi Haggard, [place] Providers Cardiologist:  None     Referring MD: Remi Haggard, FNP   Chief Complaint  Patient presents with   New Patient (Initial Visit)    Referred by PCP for a second opinion. Meds reviewed verbally with paitent.     History of Present Illness:    Pam Snyder is a 56 y.o. female with a hx of hypertension, hyperlipidemia, mitral regurgitation, secundum ASD s/p Amplatzer closure 2006, diabetes who presents due to left-sided chest pain.  Previously followed at [facility] cardiology from a cardiac perspective.  Has left-sided chest discomfort ongoing for several months now, symptoms reproducible with palpation.  History of mitral regurgitation on last echocardiogram dated 2021.  Currently takes only aspirin.  States eating healthier, blood pressure is controlled off BP meds.  Also endorsed eating low-cholesterol diet.  Previously was on Pravachol.  Has not obtain repeat fasting lipid profile yet.  Chest CT 12/2021 LAD calcification Echo 11/2019 EF 50 to 55%, moderate to severe MR [facility] 11/2019 no evidence for ischemia, low risk study Echo 2017 at [facility] EF 55%  Past Medical History:  Diagnosis Date   Abdominal pain    Anemia    Arthritis    hips, legs, arms   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   Overview:  1. 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   Stroke ([place])    15 years ago, no deficits    Past Surgical History:  Procedure Laterality Date   CARDIAC CATHETERIZATION      x3 last in 2014, device implanted to help MVP   ENDOMETRIAL ABLATION  2014   [place]  01/04/2017   Procedure: HERNIA REPAIR;  Surgeon: Jules Husbands, MD;  Location: [facility] ORS;  Service: General;;   fibroid cyst     LAPAROTOMY N/A 01/04/2017   Procedure: EXPLORATORY LAPAROTOMY;  Surgeon: Jules Husbands, MD;  Location: [facility] ORS;  Service: General;  Laterality: N/A;   LYSIS OF ADHESION  01/04/2017   Procedure: LYSIS OF ADHESION;  Surgeon: Jules Husbands, MD;  Location: [facility] ORS;  Service: General;;    Current Medications: Current Meds  Medication Sig   [employer] ASPIRIN ADULT LOW DOSE 81 MG chewable tablet Chew 81 mg by mouth daily.   metoprolol tartrate (LOPRESSOR) 100 MG tablet Take 1 tablet (100 mg total) by mouth once for 1 dose. Take 2 hours prior to your CT scan.     Allergies:   No known allergies   Social History   Socioeconomic History   Marital status: Divorced    Spouse name: Not on file   Number of children: Not on file   Years of education: Not on file   Highest education level: Not on file  Occupational History   Not on file  Tobacco Use   Smoking status: Never   Smokeless tobacco: Never  Vaping Use   Vaping  Use: Never used  Substance and Sexual Activity   Alcohol use: No   Drug use: No   Sexual activity: Not on file  Other Topics Concern   Not on file  Social History Narrative   Not on file   Social Determinants of Health   Financial Resource Strain: Not on file  Food Insecurity: Not on file  Transportation Needs: Not on file  Physical Activity: Not on file  Stress: Not on file  Social Connections: Not on file     Family History: The patient's family history includes Breast cancer (age of onset: 90) in her maternal aunt and maternal aunt; Colon cancer in her maternal grandmother; Leukemia in her father; Lung cancer in her mother; Sickle cell anemia in her brother and daughter.  ROS:   Please see the history of present illness.      All other systems reviewed and are negative.  EKGs/Labs/Other Studies Reviewed:    The following studies were reviewed today:   EKG:  EKG is  ordered today.  The ekg ordered today demonstrates normal sinus rhythm, heart rate 67  Recent Labs: 10/14/2021: Magnesium 1.9 02/01/2022: ALT 17; BUN 13; Creatinine, Ser 0.78; Hemoglobin 11.6; Platelets 252; Potassium 4.3; Sodium 142  Recent Lipid Panel    Component Value Date/Time   CHOL 181 04/04/2018 0356   TRIG 41 10/14/2021 1024   HDL 58 04/04/2018 0356   CHOLHDL 3.1 04/04/2018 0356   VLDL 18 04/04/2018 0356   LDLCALC 105 (H) 04/04/2018 0356     Risk Assessment/Calculations:              Physical Exam:    VS:  BP 128/70 (BP Location: Left Arm, Patient Position: Sitting, Cuff Size: Normal)   Pulse 67   Ht 5' 8.5" (1.74 m)   Wt 200 lb (90.7 kg)   LMP  (LMP Unknown) Comment: June 2020  SpO2 99%   BMI 29.97 kg/m     Wt Readings from Last 3 Encounters:  02/06/22 200 lb (90.7 kg)  02/01/22 189 lb (85.7 kg)  12/23/21 205 lb 0.4 oz (93 kg)     GEN:  Well nourished, well developed in no acute distress HEENT: Normal NECK: No JVD; No carotid bruits CARDIAC: RRR, no murmurs, rubs, gallops RESPIRATORY:  Clear to auscultation without rales, wheezing or rhonchi  ABDOMEN: Soft, non-tender, non-distended MUSCULOSKELETAL:  No edema; left chest tenderness with palpation SKIN: Warm and dry NEUROLOGIC:  Alert and oriented x 3 PSYCHIATRIC:  Normal affect   ASSESSMENT:    1. Precordial pain   2. Coronary artery disease involving native coronary artery of native heart without angina pectoris   3. Status post device closure of ASD   4. Pure hypercholesterolemia    PLAN:    In order of problems listed above:  Chest pain, appears atypical, reproducible with palpation,  musculoskeletal in etiology, advised to follow-up with PCP regarding management. Coronary calcification on chest CT, continue aspirin 81 mg, obtain fasting lipid  profile.  If not well controlled, restart Pravachol.  Get coronary CTA to evaluate nonobstructive disease. History of ASD s/p occluder device.  Evaluate for leakage with CT as above. History of hyperlipidemia, obtain fasting lipid profile.  Continue low-cholesterol diet.  Follow-up after cardiac testing     Medication Adjustments/Labs and Tests Ordered: Current medicines are reviewed at length with the patient today.  Concerns regarding medicines are outlined above.  Orders Placed This Encounter  Procedures   CT CORONARY MORPH W/CTA COR W/SCORE  W/CA W/CM &/OR WO/CM   Lipid panel   EKG 12-Lead   ECHOCARDIOGRAM COMPLETE   Meds ordered this encounter  Medications   metoprolol tartrate (LOPRESSOR) 100 MG tablet    Sig: Take 1 tablet (100 mg total) by mouth once for 1 dose. Take 2 hours prior to your CT scan.    Dispense:  1 tablet    Refill:  0    Patient Instructions  Medication Instructions:   Your physician recommends that you continue on your current medications as directed. Please refer to the Current Medication list given to you today.  *If you need a refill on your cardiac medications before your next appointment, please call your pharmacy*  LAB:  Your physician recommends that you return for a FASTING lipid profile: at your convenience  - You will need to be fasting. Please do not have anything to eat or drink after midnight the morning you have the lab work. You may only have water or black coffee with no cream or sugar.   - Please go to the [facility]. You will check in at the front desk to the right as you walk into the atrium. Valet Parking is offered if needed. - No appointment needed. You may go any day between 7 am and 6 pm.    Testing/Procedures:  Your physician has requested that you have an echocardiogram. Echocardiography is a painless test that uses sound waves to create images of your heart. It provides your doctor with information about the size and  shape of your heart and how well your heart's chambers and valves are working. This procedure takes approximately one hour. There are no restrictions for this procedure.  2.    Your physician has requested that you have cardiac CT. Cardiac computed tomography (CT) is a painless test that uses an x-ray machine to take clear, detailed pictures of your heart.    Your cardiac CT will be scheduled at:  [facility] [address] [place], [place] [address] [phone]   Thursday 02/26/22 at 10:15 AM   Please arrive 15 mins early for check-in and test prep.    Please follow these instructions carefully (unless otherwise directed):    On the Night Before the Test: Be sure to Drink plenty of water. Do not consume any caffeinated/decaffeinated beverages or chocolate 12 hours prior to your test.  On the Day of the Test: Drink plenty of water until 1 hour prior to the test. Do not eat any food 4 hours prior to the test. You may take your regular medications prior to the test.  Take metoprolol (Lopressor) 100 MG two hours prior to test. HOLD Hydrochlorothiazide morning of the test. FEMALES- please wear underwire-free bra if available, avoid dresses & tight clothing       After the Test: Drink plenty of water. After receiving IV contrast, you may experience a mild flushed feeling. This is normal. On occasion, you may experience a mild rash up to 24 hours after the test. This is not dangerous. If this occurs, you can take Benadryl 25 mg and increase your fluid intake. If you experience trouble breathing, this can be serious. If it is severe call 911 IMMEDIATELY. If it is mild, please call our office. If you take any of these medications: Glipizide/Metformin, Avandament, Glucavance, please do not take 48 hours after completing test unless otherwise instructed.  Please allow 2-4 weeks for scheduling of routine cardiac CTs. Some insurance  companies require a pre-authorization which may delay scheduling of this test.   For non-scheduling related questions, please contact the cardiac imaging nurse navigator should you have any questions/concerns: Marchia Bond, Cardiac Imaging Nurse Navigator Gordy Clement, Cardiac Imaging Nurse Navigator [place] [facility] Direct Office Dial: [phone]   For scheduling needs, including cancellations and rescheduling, please call [place], [phone].     Follow-Up: At [facility], you and your health needs are our priority.  As part of our continuing mission to provide you with exceptional heart care, we have created designated Provider Care Teams.  These Care Teams include your primary Cardiologist (physician) and Advanced Practice Providers (APPs -  Physician Assistants and Nurse Practitioners) who all work together to provide you with the care you need, when you need it.  We recommend signing up for the patient portal called "MyChart".  Sign up information is provided on this After Visit Summary.  MyChart is used to connect with patients for Virtual Visits (Telemedicine).  Patients are able to view lab/test results, encounter notes, upcoming appointments, etc.  Non-urgent messages can be sent to your provider as well.   To learn more about what you can do with MyChart, go to [email or web address].    Your next appointment:   Follow up after testing   The format for your next appointment:   In Person  Provider:    ONLY WITH Kate Sable, MD    Other Instructions   Important Information About Sugar         Signed, Kate Sable, MD  02/06/2022 11:25 AM    [place]

## 2022-02-06 NOTE — Patient Instructions (Addendum)
Medication Instructions:   Your physician recommends that you continue on your current medications as directed. Please refer to the Current Medication list given to you today.  *If you need a refill on your cardiac medications before your next appointment, please call your pharmacy*  LAB:  Your physician recommends that you return for a FASTING lipid profile: at your convenience  - You will need to be fasting. Please do not have anything to eat or drink after midnight the morning you have the lab work. You may only have water or black coffee with no cream or sugar.   - Please go to the [facility]. You will check in at the front desk to the right as you walk into the atrium. Valet Parking is offered if needed. - No appointment needed. You may go any day between 7 am and 6 pm.    Testing/Procedures:  Your physician has requested that you have an echocardiogram. Echocardiography is a painless test that uses sound waves to create images of your heart. It provides your doctor with information about the size and shape of your heart and how well your heart's chambers and valves are working. This procedure takes approximately one hour. There are no restrictions for this procedure.  2.    Your physician has requested that you have cardiac CT. Cardiac computed tomography (CT) is a painless test that uses an x-ray machine to take clear, detailed pictures of your heart.    Your cardiac CT will be scheduled at:  [facility] [address] [place], [place] [address] [phone]   Thursday 02/26/22 at 10:15 AM   Please arrive 15 mins early for check-in and test prep.    Please follow these instructions carefully (unless otherwise directed):    On the Night Before the Test: Be sure to Drink plenty of water. Do not consume any caffeinated/decaffeinated beverages or chocolate 12 hours prior to your test.  On the Day of the Test: Drink  plenty of water until 1 hour prior to the test. Do not eat any food 4 hours prior to the test. You may take your regular medications prior to the test.  Take metoprolol (Lopressor) 100 MG two hours prior to test. HOLD Hydrochlorothiazide morning of the test. FEMALES- please wear underwire-free bra if available, avoid dresses & tight clothing       After the Test: Drink plenty of water. After receiving IV contrast, you may experience a mild flushed feeling. This is normal. On occasion, you may experience a mild rash up to 24 hours after the test. This is not dangerous. If this occurs, you can take Benadryl 25 mg and increase your fluid intake. If you experience trouble breathing, this can be serious. If it is severe call 911 IMMEDIATELY. If it is mild, please call our office. If you take any of these medications: Glipizide/Metformin, Avandament, Glucavance, please do not take 48 hours after completing test unless otherwise instructed.  Please allow 2-4 weeks for scheduling of routine cardiac CTs. Some insurance companies require a pre-authorization which may delay scheduling of this test.   For non-scheduling related questions, please contact the cardiac imaging nurse navigator should you have any questions/concerns: Marchia Bond, Cardiac Imaging Nurse Navigator Gordy Clement, Cardiac Imaging Nurse Navigator [place] [facility] Direct Office Dial: [phone]   For scheduling needs, including cancellations and rescheduling, please call [place], [phone].     Follow-Up: At [facility], you and your  health needs are our priority.  As part of our continuing mission to provide you with exceptional heart care, we have created designated Provider Care Teams.  These Care Teams include your primary Cardiologist (physician) and Advanced Practice Providers (APPs -  Physician Assistants and Nurse Practitioners) who all work together to provide you with the care you  need, when you need it.  We recommend signing up for the patient portal called "MyChart".  Sign up information is provided on this After Visit Summary.  MyChart is used to connect with patients for Virtual Visits (Telemedicine).  Patients are able to view lab/test results, encounter notes, upcoming appointments, etc.  Non-urgent messages can be sent to your provider as well.   To learn more about what you can do with MyChart, go to [email or web address].    Your next appointment:   Follow up after testing   The format for your next appointment:   In Person  Provider:    ONLY WITH Kate Sable, MD    Other Instructions   Important Information About Sugar

## 2022-02-26 ENCOUNTER — Inpatient Hospital Stay: Admission: RE | Admit: 2022-02-26 | Payer: Medicaid Other | Source: Ambulatory Visit

## 2022-02-27 ENCOUNTER — Telehealth (HOSPITAL_COMMUNITY): Payer: Self-pay | Admitting: *Deleted

## 2022-02-27 NOTE — Telephone Encounter (Signed)
Reaching out to patient to offer assistance regarding upcoming cardiac imaging study; pt verbalizes understanding of appt date/time, parking situation and where to check in, medications ordered, and verified current allergies; name and call back number provided for further questions should they arise  Gordy Clement RN Navigator Cardiac [place] [facility] [phone] office [phone] cell  Patient to take '50mg'$  metoprolol tartrate TWO hour prior to her cardiac CT scan.

## 2022-03-02 ENCOUNTER — Ambulatory Visit: Admission: RE | Admit: 2022-03-02 | Payer: Medicaid Other | Source: Ambulatory Visit

## 2022-03-06 ENCOUNTER — Telehealth (HOSPITAL_COMMUNITY): Payer: Self-pay | Admitting: *Deleted

## 2022-03-06 NOTE — Telephone Encounter (Signed)
Reaching to see if patient has any questions about her rescheduled cardiac CT scan. She did not have any.  Gordy Clement RN Navigator Cardiac Imaging [facility] [phone] Office [phone] Cell

## 2022-03-09 ENCOUNTER — Ambulatory Visit
Admission: RE | Admit: 2022-03-09 | Discharge: 2022-03-09 | Disposition: A | Discharge status: Home / Self Care | Payer: Medicaid Other | Source: Ambulatory Visit | Attending: Cardiology | Admitting: Cardiology

## 2022-03-09 DIAGNOSIS — R072 Precordial pain: Secondary | ICD-10-CM | POA: Insufficient documentation

## 2022-03-09 MED ORDER — IOHEXOL 350 MG/ML SOLN
100.0000 mL | Freq: Once | INTRAVENOUS | Status: AC | PRN
  Administered 2022-03-09: 100 mL via INTRAVENOUS

## 2022-03-09 MED ORDER — NITROGLYCERIN 0.4 MG SL SUBL
0.8000 mg | SUBLINGUAL_TABLET | Freq: Once | SUBLINGUAL | Status: AC
  Administered 2022-03-09: 0.8 mg via SUBLINGUAL

## 2022-03-09 NOTE — Progress Notes (Signed)
Patient tolerated procedure well. Ambulate w/o difficulty. Denies any lightheadedness or being dizzy. Pt denies any pain at this time. Sitting in chair, pt is encouraged to drink additional water throughout the day and reason explained to patient. Patient verbalized understanding and all questions answered. ABC intact. No further needs at this time. Discharge from procedure area w/o issues.  

## 2022-03-18 ENCOUNTER — Other Ambulatory Visit
Admission: RE | Admit: 2022-03-18 | Discharge: 2022-03-18 | Disposition: A | Discharge status: Home / Self Care | Payer: Self-pay | Attending: Cardiology | Admitting: Cardiology

## 2022-03-18 ENCOUNTER — Ambulatory Visit: Payer: Self-pay

## 2022-03-18 DIAGNOSIS — E7800 Pure hypercholesterolemia, unspecified: Secondary | ICD-10-CM | POA: Insufficient documentation

## 2022-03-18 LAB — LIPID PANEL
Cholesterol: 214 mg/dL — ABNORMAL HIGH (ref 0–200)
HDL: 62 mg/dL (ref >40)
LDL Cholesterol: 136 mg/dL — ABNORMAL HIGH (ref 0–99)
Total CHOL/HDL Ratio: 3.5 RATIO
Triglycerides: 79 mg/dL (ref <150)
VLDL: 16 mg/dL (ref 0–40)

## 2022-03-19 ENCOUNTER — Other Ambulatory Visit: Payer: Self-pay

## 2022-03-19 ENCOUNTER — Emergency Department
Admission: EM | Admit: 2022-03-19 | Discharge: 2022-03-20 | Disposition: A | Discharge status: Home / Self Care | Payer: Medicaid Other | Attending: Emergency Medicine | Admitting: Emergency Medicine

## 2022-03-19 ENCOUNTER — Emergency Department: Payer: Medicaid Other

## 2022-03-19 DIAGNOSIS — M79662 Pain in left lower leg: Secondary | ICD-10-CM

## 2022-03-19 NOTE — ED Triage Notes (Signed)
Patient complains of left leg pain that started up again yesterday.  Reports swelling in calf.  Pain is not worse with dorsi or plantar flexion.  +pedal pulse.

## 2022-03-19 NOTE — Discharge Instructions (Addendum)
You may alternate Tylenol 1000 mg every 6 hours as needed for pain, fever and Ibuprofen 800 mg every 6-8 hours as needed for pain, fever.  Please take Ibuprofen with food.  Do not take more than 4000 mg of Tylenol (acetaminophen) in a 24 hour period.   Your ultrasound today showed no sign of a deep vein thrombosis.  There is no sign of arterial obstruction on exam or infection.  Symptoms likely secondary to muscle cramping.  I recommend hydration with Gatorade, Powerade, Pedialyte.  You may eat foods high in potassium such as bananas.  I recommend stretching this leg if you continue to have cramping.

## 2022-03-19 NOTE — ED Provider Triage Note (Signed)
Emergency Medicine Provider Triage Evaluation Note  Pam Snyder , a 56 y.o. female  was evaluated in triage.  Pt complains of calf pain and edema.  Symptoms complains of pain since yesterday.  No history of DVT or PE.  Takes aspirin but no blood thinners.  Review of Systems  Positive: Calf pain and edema Negative: Trauma, ankle pain, GI complaints, chest pain  Physical Exam  BP (!) 143/91 (BP Location: Left Arm)   Pulse 85   Temp 98.6 F (37 C) (Oral)   Resp 18   Ht '5\' 8"'$  (1.727 m)   Wt 90.7 kg   LMP  (LMP Unknown) Comment: June 2020  SpO2 98%   BMI 30.41 kg/m  Gen:   Awake, no distress  Resp:  Normal effort  MSK:   Moves extremities without difficulty Other:    Medical Decision Making  Medically screening exam initiated at 7:37 PM.  Appropriate orders placed.  Pam Snyder was informed that the remainder of the evaluation will be completed by another provider, this initial triage assessment does not replace that evaluation, and the importance of remaining in the ED until their evaluation is complete.  Patient will have ultrasound at this time   Pam Snyder 03/19/22 1937

## 2022-03-19 NOTE — ED Provider Notes (Signed)
[facility] Provider Note    Event Date/Time   First MD Initiated Contact with Patient 03/19/22 2336     (approximate)   History   Leg Pain   HPI  Pam Snyder is a 56 y.o. female with history of sickle cell trait, migraines, anemia, previous stroke who presents to the emergency department with intermittent left leg cramping.  She has had similar symptoms before but this was more persistent.  No known injury.  States she has chronic chest pain and is being seen by cardiology for this.  She just had a cardiac CT which showed mild nonobstructive coronary artery disease.  She has had previous CTA of her chest in July 2023 which showed no PE.  She denies any new chest pain.  Not having any shortness of breath.  No injury to the leg.  Not having any pain currently.  No previous history of PE or DVT.  Sent here by her primary care provider for further evaluation.  She denies any fevers.    History provided by patient.    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   Stroke ([place])    15 years ago, no deficits    Past Surgical History:  Procedure Laterality Date   CARDIAC CATHETERIZATION     x3 last in 2014, device implanted to help MVP   ENDOMETRIAL ABLATION  2014   [place]  01/04/2017   Procedure: HERNIA REPAIR;  Surgeon: Jules Husbands, MD;  Location: [facility] ORS;  Service: General;;   fibroid cyst     LAPAROTOMY N/A 01/04/2017   Procedure:  EXPLORATORY LAPAROTOMY;  Surgeon: Jules Husbands, MD;  Location: [facility] ORS;  Service: General;  Laterality: N/A;   LYSIS OF ADHESION  01/04/2017   Procedure: LYSIS OF ADHESION;  Surgeon: Jules Husbands, MD;  Location: [facility] ORS;  Service: General;;    MEDICATIONS:  Prior to Admission medications   Medication Sig Start Date End Date Taking? Authorizing Provider  albuterol (VENTOLIN HFA) 108 (90 Base) MCG/ACT inhaler Inhale 2 puffs into the lungs every 6 (six) hours as needed for wheezing or shortness of breath. Patient not taking: Reported on 02/06/2022 05/22/21   Rudene Re, MD  benzonatate (TESSALON PERLES) 100 MG capsule Take 1 capsule (100 mg total) by mouth 3 (three) times daily as needed for cough. Patient not taking: Reported on 02/06/2022 12/23/21   Poggi, Eliezer Lofts E, PA-C  [employer] ASPIRIN ADULT LOW DOSE 81 MG chewable tablet Chew 81 mg by mouth daily. 12/18/21   [provider]  hydrochlorothiazide (HYDRODIURIL) 25 MG tablet Take 25 mg by mouth daily. Patient not taking: Reported on 02/06/2022 09/23/21   [provider]  metFORMIN (GLUCOPHAGE) 500 MG tablet Take 500 mg by mouth 2 (two) times daily. Patient not taking: Reported on 02/06/2022 09/23/21   [provider]  metoprolol tartrate (LOPRESSOR) 100 MG tablet Take 1 tablet (100 mg total) by mouth once for 1 dose. Take 2 hours prior to your CT scan. 02/06/22 02/06/22  Kate Sable, MD  polyethylene glycol powder (GLYCOLAX/MIRALAX) 17 GM/SCOOP powder 2 cap fulls in a full glass of water, three times a day, for 5 days. Patient not taking: Reported on 02/06/2022 02/01/22   Carrie Mew, MD  pravastatin (PRAVACHOL) 20 MG tablet Take 20 mg by mouth daily. Patient not taking: Reported on 02/06/2022 09/23/21   [provider]    Physical Exam   Triage Vital Signs: ED Triage Vitals [03/19/22 1847]  Enc Vitals Group     BP (!) 143/91     Pulse Rate 85     Resp 18     Temp 98.6 F (37 C)     Temp Source Oral      SpO2 98 %     Weight 200 lb (90.7 kg)     Height '5\' 8"'$  (1.727 m)     Head Circumference      Peak Flow      Pain Score 9     Pain Loc      Pain Edu?      Excl. in [place]?     Most recent vital signs: Vitals:   03/19/22 2319 03/20/22 0056  BP: (!) 149/75 (!) 144/77  Pulse: 68 66  Resp: 18 18  Temp: 98.2 F (36.8 C)   SpO2: 98% 99%    CONSTITUTIONAL: Alert and oriented and responds appropriately to questions. Well-appearing; well-nourished HEAD: Normocephalic, atraumatic EYES: Conjunctivae clear, pupils appear equal, sclera nonicteric ENT: normal nose; moist mucous membranes NECK: Supple, normal ROM CARD: RRR; S1 and S2 appreciated; no murmurs, no clicks, no rubs, no gallops RESP: Normal chest excursion without splinting or tachypnea; breath sounds clear and equal bilaterally; no wheezes, no rhonchi, no rales, no hypoxia or respiratory distress, speaking full sentences ABD/GI: Normal bowel sounds; non-distended; soft, non-tender, no rebound, no guarding, no peritoneal signs BACK: The back appears normal EXT: Normal ROM in all joints; no deformity noted, no edema; no cyanosis, patient points to the posterior left calf is where she is having pain intermittently but no pain currently.  There is no asymmetry between the legs.  Compartments are soft.  No joint effusion, increase warmth or redness.  Easily palpable 2+ left DP pulse on exam with normal capillary refill.  Normal sensation in the leg.  Normal gait. SKIN: Normal color for age and race; warm; no rash on exposed skin NEURO: Moves all extremities equally, normal speech PSYCH: The patient's mood and manner are appropriate.   ED Results / Procedures / Treatments   LABS: (all labs ordered are listed, but only abnormal results are displayed) Labs Reviewed - No data to display   EKG:  EKG Interpretation  Date/Time:    Ventricular Rate:    PR Interval:    QRS Duration:   QT Interval:    QTC Calculation:   R Axis:      Text Interpretation:           RADIOLOGY: My personal review and interpretation of imaging: Doppler of the left leg shows no DVT.  I have personally reviewed all radiology reports.   US Venous Img Lower Unilateral Left  Result Date: 03/19/2022 CLINICAL DATA:  Calf pain and edema for 1 day. Varicose veins. Aspirin anticoagulation therapy. EXAM: Left LOWER EXTREMITY VENOUS DOPPLER ULTRASOUND TECHNIQUE: Gray-scale sonography with compression, as well as  color and duplex ultrasound, were performed to evaluate the deep venous system(s) from the level of the common femoral vein through the popliteal and proximal calf veins. COMPARISON:  None Available. FINDINGS: VENOUS Normal compressibility of the common femoral, superficial femoral, and popliteal veins, as well as the visualized calf veins. Visualized portions of profunda femoral vein and great saphenous vein unremarkable. No filling defects to suggest DVT on grayscale or color Doppler imaging. Doppler waveforms show normal direction of venous flow, normal respiratory plasticity and response to augmentation. Limited views of the contralateral common femoral vein are unremarkable. OTHER None. Limitations: none IMPRESSION: No evidence of acute deep venous thrombosis in the visualized lower extremity veins. Electronically Signed   By: Lucienne Capers M.D.   On: 03/19/2022 20:04     PROCEDURES:  Critical Care performed: No      Procedures    IMPRESSION / MDM / ASSESSMENT AND PLAN / ED COURSE  I reviewed the triage vital signs and the nursing notes.    Patient here with intermittent left leg cramping.  Sent here to rule out DVT.  No new chest pain or shortness of breath.     DIFFERENTIAL DIAGNOSIS (includes but not limited to):   Muscle cramps, DVT, no sign of arterial obstruction, cellulitis, compartment syndrome, fracture, gout, septic arthritis   Patient's presentation is most consistent with acute presentation with potential  threat to life or bodily function.   PLAN: Doppler obtained from triage and reviewed and interpreted by myself and the radiologist and shows no DVT.  Her exam is reassuring today and she is not having any pain currently.  She is not having any new chest pain or shortness of breath at this time and is hemodynamically stable.  She has no sign of injury, arterial obstruction or infectious etiology today.  She is able to ambulate on the leg without difficulty.  She has neurovascular intact distally.  Recommended Tylenol, ibuprofen over-the-counter for pain control.  Have offered her something for pain here which she is declined.  She has a PCP for close follow-up.  Recommend drinking electrolyte replacement drinks or eating foods high in potassium such as bananas to see if this helps with this intermittent cramping.  Recommended massage, stretching as well.  Patient states she is comfortable with this plan.   MEDICATIONS GIVEN IN ED: Medications - No data to display   ED COURSE:  At this time, I do not feel there is any life-threatening condition present. I reviewed all nursing notes, vitals, pertinent previous records.  All lab and urine results, EKGs, imaging ordered have been independently reviewed and interpreted by myself.  I reviewed all available radiology reports from any imaging ordered this visit.  Based on my assessment, I feel the patient is safe to be discharged home without further emergent workup and can continue workup as an outpatient as needed. Discussed all findings, treatment plan as well as usual and customary return precautions.  They verbalize understanding and are comfortable with this plan.  Outpatient follow-up has been provided as needed.  All questions have been answered.    CONSULTS: No admission needed at this time.  Patient is appropriate for further outpatient management.   OUTSIDE RECORDS REVIEWED: Reviewed patient's recent cardiology notes including recent cardiac CT  and CTA chest.       FINAL CLINICAL IMPRESSION(S) / ED DIAGNOSES   Final diagnoses:  Pain of left calf     Rx / DC Orders   ED Discharge Orders  None        Note:  This document was prepared using Dragon voice recognition software and may include unintentional dictation errors.   Hasten Sweitzer, Delice Bison, DO 03/20/22 1317

## 2022-03-20 ENCOUNTER — Ambulatory Visit: Payer: Medicaid Other | Admitting: Cardiology

## 2022-03-20 ENCOUNTER — Telehealth: Payer: Self-pay

## 2022-03-20 NOTE — ED Notes (Signed)
E signature pad not working. Pt educated on discharge instructions and verbalized understanding.  

## 2022-03-20 NOTE — Telephone Encounter (Signed)
-----   Message from Kate Sable, MD sent at 03/19/2022  5:39 PM EDT ----- Cholesterol not well controlled, start Lipitor 40 mg daily.

## 2022-03-24 ENCOUNTER — Telehealth: Payer: Self-pay

## 2022-03-24 DIAGNOSIS — E7800 Pure hypercholesterolemia, unspecified: Secondary | ICD-10-CM

## 2022-03-24 NOTE — Telephone Encounter (Signed)
-----   Message from Kate Sable, MD sent at 03/19/2022  5:39 PM EDT ----- Cholesterol not well controlled, start Lipitor 40 mg daily.

## 2022-03-24 NOTE — Telephone Encounter (Signed)
Spoke with patient and she stated that she started taking her Pravastatin 20 MG again 2 days after her Lipid panel was drawn. I advised her to keep taking that for now, and informed her that I would speak to Dr.Agbor-Etang to see if he would still recommend Atorvastatin or just have her continue with the Pravastatin.  Patient verbalized understanding and agreed with plan.

## 2022-03-24 NOTE — Telephone Encounter (Signed)
Called patient and we did not have a great connection to hear each other. I informed her that I would send a MyChart message and she agreed to that.

## 2022-03-24 NOTE — Telephone Encounter (Signed)
Pt is returning call.  

## 2022-03-24 NOTE — Telephone Encounter (Signed)
Duplicate encounter.     Closing Encounter.

## 2022-03-24 NOTE — Telephone Encounter (Signed)
Called patient and left a VM requesting a call back. 

## 2022-03-25 NOTE — Telephone Encounter (Signed)
Discussed with Dr. Garen Lah and he recommended that the patient continue to take the Rosuvastatin 20 MG daily, and get a repeat Lipid Panel drawn in 3 Months.   Called patient and was unable to leave a VM. Will try again later.

## 2022-03-26 MED ORDER — PRAVASTATIN SODIUM 20 MG PO TABS: 20.0000 mg | ORAL_TABLET | Freq: Every day | ORAL | 5 refills | Status: AC

## 2022-03-26 NOTE — Telephone Encounter (Signed)
Called and left a detailed VM per DPR on file with instructions to continue taking the Pravastatin 20 MG and to get a fasting Lipid drawn in 3 months at the [facility]. Encouraged patient to call back or send a Mychart message if she has any further questions or concerns.

## 2022-05-07 ENCOUNTER — Other Ambulatory Visit: Payer: Self-pay

## 2022-05-08 ENCOUNTER — Other Ambulatory Visit: Payer: Self-pay

## 2022-05-08 ENCOUNTER — Ambulatory Visit: Payer: Medicaid Other | Admitting: Cardiology

## 2022-05-11 ENCOUNTER — Ambulatory Visit: Payer: Self-pay | Admitting: Cardiology

## 2022-05-24 ENCOUNTER — Other Ambulatory Visit: Payer: Self-pay

## 2022-05-24 ENCOUNTER — Emergency Department (HOSPITAL_COMMUNITY)
Admission: EM | Admit: 2022-05-24 | Discharge: 2022-05-25 | Disposition: A | Discharge status: Home / Self Care | Payer: Medicaid Other | Attending: Emergency Medicine | Admitting: Emergency Medicine

## 2022-05-24 DIAGNOSIS — I8002 Phlebitis and thrombophlebitis of superficial vessels of left lower extremity: Secondary | ICD-10-CM | POA: Insufficient documentation

## 2022-05-24 DIAGNOSIS — Z8673 Personal history of transient ischemic attack (TIA), and cerebral infarction without residual deficits: Secondary | ICD-10-CM | POA: Insufficient documentation

## 2022-05-24 DIAGNOSIS — Z7982 Long term (current) use of aspirin: Secondary | ICD-10-CM | POA: Insufficient documentation

## 2022-05-24 DIAGNOSIS — Z7984 Long term (current) use of oral hypoglycemic drugs: Secondary | ICD-10-CM | POA: Insufficient documentation

## 2022-05-24 DIAGNOSIS — Z79899 Other long term (current) drug therapy: Secondary | ICD-10-CM | POA: Insufficient documentation

## 2022-05-24 DIAGNOSIS — I10 Essential (primary) hypertension: Secondary | ICD-10-CM | POA: Insufficient documentation

## 2022-05-24 LAB — BASIC METABOLIC PANEL
Anion gap: 8 (ref 5–15)
BUN: 12 mg/dL (ref 6–20)
CO2: 26 mmol/L (ref 22–32)
Calcium: 9.3 mg/dL (ref 8.9–10.3)
Chloride: 105 mmol/L (ref 98–111)
Creatinine, Ser: 0.7 mg/dL (ref 0.44–1.00)
GFR, Estimated: >60 mL/min (ref >60)
Glucose, Bld: 142 mg/dL — ABNORMAL HIGH (ref 70–99)
Potassium: 3.8 mmol/L (ref 3.5–5.1)
Sodium: 139 mmol/L (ref 135–145)

## 2022-05-24 LAB — CBC WITH DIFFERENTIAL/PLATELET
Abs Immature Granulocytes: 0.01 10*3/uL (ref 0.00–0.07)
Basophils Absolute: 0 10*3/uL (ref 0.0–0.1)
Basophils Relative: 0 %
Eosinophils Absolute: 0.1 10*3/uL (ref 0.0–0.5)
Eosinophils Relative: 1 %
HCT: 37.4 % (ref 36.0–46.0)
Hemoglobin: 12 g/dL (ref 12.0–15.0)
Immature Granulocytes: 0 %
Lymphocytes Relative: 30 %
Lymphs Abs: 1.5 10*3/uL (ref 0.7–4.0)
MCH: 24.1 pg — ABNORMAL LOW (ref 26.0–34.0)
MCHC: 32.1 g/dL (ref 30.0–36.0)
MCV: 75.3 fL — ABNORMAL LOW (ref 80.0–100.0)
Monocytes Absolute: 0.4 10*3/uL (ref 0.1–1.0)
Monocytes Relative: 7 %
Neutro Abs: 3 10*3/uL (ref 1.7–7.7)
Neutrophils Relative %: 62 %
Platelets: 236 10*3/uL (ref 150–400)
RBC: 4.97 MIL/uL (ref 3.87–5.11)
RDW: 14.8 % (ref 11.5–15.5)
WBC: 4.9 10*3/uL (ref 4.0–10.5)
nRBC: 0 % (ref 0.0–0.2)

## 2022-05-24 NOTE — ED Triage Notes (Signed)
Pt states that since yesterday she has some numbness in her left toes and the pain goes up into her left leg behind her knee associated with swelling.

## 2022-05-24 NOTE — ED Provider Triage Note (Signed)
Emergency Medicine Provider Triage Evaluation Note  Pam Snyder , a 56 y.o. female  was evaluated in triage.  Pt complains of left leg pain over the last 3 days.  Warm and swollen, painful with touch.  History of stroke: Pregnant, this was over 20 years ago and has been on blood thinners since then.  No chest pain or shortness of breath, no recent travel or surgeries.  Review of Systems  Per HPI  Physical Exam  BP 130/79   Pulse 77   Temp 98.6 F (37 C) (Oral)   Resp 20   LMP  (LMP Unknown) Comment: June 2020  SpO2 96%  Gen:   Awake, no distress   Resp:  Normal effort  MSK:   Moves extremities without difficulty  Other:  Palpable pedal pulses.  Slightly erythematous and warm anterior ankle.  Mild calf tenderness.  Medical Decision Making  Medically screening exam initiated at 8:52 PM.  Appropriate orders placed.  Pam Snyder was informed that the remainder of the evaluation will be completed by another provider, this initial triage assessment does not replace that evaluation, and the importance of remaining in the ED until their evaluation is complete.  Will check basic labs.  Ultrasound not available currently.   Sherrill Raring, PA-C 05/24/22 2053

## 2022-05-25 MED ORDER — CEPHALEXIN 500 MG PO CAPS: 500.0000 mg | ORAL_CAPSULE | Freq: Three times a day (TID) | ORAL | 0 refills | Status: AC

## 2022-05-25 NOTE — Discharge Instructions (Addendum)
If in 3 days, you notice that the redness extends to involve halfway up the lower leg, or begins to involve the back part of the leg as well, you may start antibiotics to treat a possible infection.

## 2022-05-25 NOTE — ED Provider Notes (Signed)
[facility] DEPT Provider Note  CSN: [id number] Arrival date & time: 05/24/22 2012  Chief Complaint(s) Leg Pain  HPI Pam Snyder is a 56 y.o. female    The history is provided by the patient.  Leg Pain Location:  Leg Time since incident:  2 days Injury: no   Leg location:  L lower leg Pain details:    Quality:  Aching and shooting   Radiates to:  L leg   Severity:  Moderate   Onset quality:  Gradual   Timing:  Constant Relieved by:  Nothing Exacerbated by: touch. 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 Authorizing Provider  cephALEXin (KEFLEX) 500 MG capsule Take 1 capsule (500 mg total) by mouth 3 (three) times daily for 7 days. 05/28/22 06/04/22 Yes Rohen Kimes, Grayce Sessions, MD   albuterol (VENTOLIN HFA) 108 (90 Base) MCG/ACT inhaler Inhale 2 puffs into the lungs every 6 (six) hours as needed for wheezing or shortness of breath. Patient not taking: Reported on 02/06/2022 05/22/21   Rudene Re, MD  benzonatate (TESSALON PERLES) 100 MG capsule Take 1 capsule (100 mg total) by mouth 3 (three) times daily as needed for cough. Patient not taking: Reported on 02/06/2022 12/23/21   Poggi, Eliezer Lofts E, PA-C  [employer] ASPIRIN ADULT LOW DOSE 81 MG chewable tablet Chew 81 mg by mouth daily. 12/18/21   [provider]  hydrochlorothiazide (HYDRODIURIL) 25 MG tablet Take 25 mg by mouth daily. Patient not taking: Reported on 02/06/2022 09/23/21   [provider]  metFORMIN (GLUCOPHAGE) 500 MG tablet Take 500 mg by mouth 2 (two) times daily. Patient not taking: Reported on 02/06/2022 09/23/21   [provider]  metoprolol tartrate (LOPRESSOR) 100 MG tablet Take 1 tablet (100 mg total) by mouth once for 1 dose. Take 2 hours prior to your CT scan. 02/06/22 02/06/22  Kate Sable, MD  polyethylene glycol powder (GLYCOLAX/MIRALAX) 17 GM/SCOOP powder 2 cap fulls in a full glass of water, three times a day, for 5 days. Patient not taking: Reported on 02/06/2022 02/01/22   Carrie Mew, MD  pravastatin (PRAVACHOL) 20 MG tablet Take 1 tablet (20 mg total) by  mouth daily. 03/26/22   Kate Sable, MD                                                                                                                                    Allergies No known allergies  Review of Systems Review of Systems  Constitutional:  Negative for fever.   As noted in HPI  Physical Exam Vital Signs  I have reviewed the triage vital signs BP 130/79   Pulse 77   Temp 98.6 F (37 C) (Oral)   Resp 20   LMP  (LMP Unknown) Comment: June 2020  SpO2 96%   Physical Exam Vitals reviewed.  Constitutional:      General: She is not in acute distress.    Appearance: She is well-developed.  She is not diaphoretic.  HENT:     Head: Normocephalic and atraumatic.     Right Ear: External ear normal.     Left Ear: External ear normal.     Nose: Nose normal.  Eyes:     General: No scleral icterus.    Conjunctiva/sclera: Conjunctivae normal.  Neck:     Trachea: Phonation normal.  Cardiovascular:     Rate and Rhythm: Normal rate and regular rhythm.  Pulmonary:     Effort: Pulmonary effort is normal. No respiratory distress.     Breath sounds: No stridor.  Abdominal:     General: There is no distension.  Musculoskeletal:        General: Normal range of motion.     Cervical back: Normal range of motion.     Right lower leg: No swelling.       Legs:  Neurological:     Mental Status: She is alert and oriented to person, place, and time.  Psychiatric:        Behavior: Behavior normal.     ED Results and Treatments Labs (all labs ordered are listed, but only abnormal results are displayed) Labs Reviewed  BASIC METABOLIC PANEL - Abnormal; Notable for the following components:      Result Value   Glucose, Bld 142 (*)    All other components within normal limits  CBC WITH DIFFERENTIAL/PLATELET - Abnormal; Notable for the following components:   MCV 75.3 (*)    MCH 24.1 (*)    All other components within normal limits  EKG  EKG Interpretation  Date/Time:    Ventricular Rate:    PR Interval:    QRS Duration:   QT Interval:    QTC Calculation:   R Axis:     Text Interpretation:         Radiology No results found.  Medications Ordered in ED Medications - No data to display                                                                                                                                   Procedures Procedures  (including critical care time)  Medical Decision Making / ED Course   Medical Decision Making Amount and/or  Complexity of Data Reviewed Labs: ordered. Decision-making details documented in ED Course.   Differential includes but not limited to: Superficial thrombophlebitis, early cellulitis.  Low suspicion for DVT.  No evidence of arterial occlusion.  No compartment syndrome.  No septic arthritis.  CBC without leukocytosis or anemia Metabolic panel without significant electrolyte derangements or renal sufficiency  Will treat for superficial thrombophlebitis.  In case this is early cellulitis, patient was given a delayed prescription for Keflex and specific instructions if/when to start.       Final Clinical Impression(s) / ED Diagnoses Final diagnoses:  Thrombophlebitis of superficial veins of left lower extremity   The patient appears reasonably screened and/or stabilized for discharge and I doubt any other medical condition or other [facility] requiring further screening, evaluation, or treatment in the ED at this time. I have discussed the findings, Dx and Tx plan with the patient/family who expressed understanding and agree(s) with the plan. Discharge instructions discussed at length. The patient/family was given strict return precautions who verbalized understanding of the instructions. No further questions at time of discharge.  Disposition: Discharge  Condition: Good  ED Discharge Orders          Ordered    cephALEXin (KEFLEX) 500 MG capsule  3 times daily        05/25/22 0105             Follow Up: Remi Haggard, [address] [place] [address] [phone]  Call  to schedule an appointment for close follow up           This chart was dictated using voice recognition software.  Despite best efforts to proofread,  errors can occur which can change the documentation meaning.    Fatima Blank, MD 05/25/22 [phone]

## 2022-09-21 ENCOUNTER — Ambulatory Visit: Payer: Medicaid Other | Attending: Cardiology

## 2022-09-21 DIAGNOSIS — R072 Precordial pain: Secondary | ICD-10-CM | POA: Diagnosis not present

## 2022-09-21 DIAGNOSIS — I081 Rheumatic disorders of both mitral and tricuspid valves: Secondary | ICD-10-CM

## 2022-09-21 LAB — ECHOCARDIOGRAM COMPLETE
AR max vel: 2.15 cm2
AV Area VTI: 2.31 cm2
AV Area mean vel: 2.16 cm2
AV Mean grad: 4 mmHg
AV Peak grad: 8.1 mmHg
Ao pk vel: 1.42 m/s
Area-P 1/2: 3.72 cm2
Calc EF: 47.4 %
S' Lateral: 4.2 cm
Single Plane A2C EF: 45.8 %
Single Plane A4C EF: 49.6 %

## 2022-10-09 ENCOUNTER — Emergency Department: Payer: Medicaid Other

## 2022-10-09 ENCOUNTER — Other Ambulatory Visit: Payer: Self-pay

## 2022-10-09 ENCOUNTER — Encounter: Payer: Self-pay | Admitting: Radiology

## 2022-10-09 DIAGNOSIS — I10 Essential (primary) hypertension: Secondary | ICD-10-CM | POA: Insufficient documentation

## 2022-10-09 DIAGNOSIS — R519 Headache, unspecified: Secondary | ICD-10-CM | POA: Diagnosis present

## 2022-10-09 DIAGNOSIS — Z8673 Personal history of transient ischemic attack (TIA), and cerebral infarction without residual deficits: Secondary | ICD-10-CM | POA: Insufficient documentation

## 2022-10-09 LAB — CBC WITH DIFFERENTIAL/PLATELET
Abs Immature Granulocytes: 0 10*3/uL (ref 0.00–0.07)
Basophils Absolute: 0 10*3/uL (ref 0.0–0.1)
Basophils Relative: 1 %
Eosinophils Absolute: 0.1 10*3/uL (ref 0.0–0.5)
Eosinophils Relative: 3 %
HCT: 37.5 % (ref 36.0–46.0)
Hemoglobin: 11.9 g/dL — ABNORMAL LOW (ref 12.0–15.0)
Immature Granulocytes: 0 %
Lymphocytes Relative: 41 %
Lymphs Abs: 1.4 10*3/uL (ref 0.7–4.0)
MCH: 23.4 pg — ABNORMAL LOW (ref 26.0–34.0)
MCHC: 31.7 g/dL (ref 30.0–36.0)
MCV: 73.8 fL — ABNORMAL LOW (ref 80.0–100.0)
Monocytes Absolute: 0.2 10*3/uL (ref 0.1–1.0)
Monocytes Relative: 7 %
Neutro Abs: 1.7 10*3/uL (ref 1.7–7.7)
Neutrophils Relative %: 48 %
Platelets: 230 10*3/uL (ref 150–400)
RBC: 5.08 MIL/uL (ref 3.87–5.11)
RDW: 14.4 % (ref 11.5–15.5)
WBC: 3.4 10*3/uL — ABNORMAL LOW (ref 4.0–10.5)
nRBC: 0 % (ref 0.0–0.2)

## 2022-10-09 LAB — COMPREHENSIVE METABOLIC PANEL
ALT: 21 U/L (ref 0–44)
AST: 17 U/L (ref 15–41)
Albumin: 3.8 g/dL (ref 3.5–5.0)
Alkaline Phosphatase: 90 U/L (ref 38–126)
Anion gap: 10 (ref 5–15)
BUN: 10 mg/dL (ref 6–20)
CO2: 25 mmol/L (ref 22–32)
Calcium: 9.1 mg/dL (ref 8.9–10.3)
Chloride: 104 mmol/L (ref 98–111)
Creatinine, Ser: 0.62 mg/dL (ref 0.44–1.00)
GFR, Estimated: >60 mL/min (ref >60)
Glucose, Bld: 178 mg/dL — ABNORMAL HIGH (ref 70–99)
Potassium: 3.8 mmol/L (ref 3.5–5.1)
Sodium: 139 mmol/L (ref 135–145)
Total Bilirubin: 0.6 mg/dL (ref 0.3–1.2)
Total Protein: 6.7 g/dL (ref 6.5–8.1)

## 2022-10-09 NOTE — ED Triage Notes (Signed)
Pt states that she has been checking her BP and she can't get it to come down. Pt states her head hurts and her lips are numb. Also complains of leg swelling but the right worse than the left.

## 2022-10-10 ENCOUNTER — Emergency Department
Admission: EM | Admit: 2022-10-10 | Discharge: 2022-10-10 | Disposition: A | Discharge status: Home / Self Care | Payer: Medicaid Other | Attending: Emergency Medicine | Admitting: Emergency Medicine

## 2022-10-10 DIAGNOSIS — I10 Essential (primary) hypertension: Secondary | ICD-10-CM

## 2022-10-10 DIAGNOSIS — R519 Headache, unspecified: Secondary | ICD-10-CM

## 2022-10-10 LAB — TROPONIN I (HIGH SENSITIVITY): Troponin I (High Sensitivity): 5 ng/L (ref <18)

## 2022-10-10 LAB — CK: Total CK: 225 U/L (ref 38–234)

## 2022-10-10 MED ORDER — KETOROLAC TROMETHAMINE 15 MG/ML IJ SOLN
15.0000 mg | Freq: Once | INTRAMUSCULAR | Status: AC
  Administered 2022-10-10: 15 mg via INTRAVENOUS
  Filled 2022-10-10: qty 1

## 2022-10-10 MED ORDER — SODIUM CHLORIDE 0.9 % IV BOLUS
500.0000 mL | Freq: Once | INTRAVENOUS | Status: AC
Start: 2022-10-10 — End: 2022-10-10
  Administered 2022-10-10: 500 mL via INTRAVENOUS

## 2022-10-10 MED ORDER — DIPHENHYDRAMINE HCL 50 MG/ML IJ SOLN
25.0000 mg | Freq: Once | INTRAMUSCULAR | Status: AC
  Administered 2022-10-10: 25 mg via INTRAVENOUS
  Filled 2022-10-10: qty 1

## 2022-10-10 MED ORDER — METOCLOPRAMIDE HCL 5 MG/ML IJ SOLN
10.0000 mg | Freq: Once | INTRAMUSCULAR | Status: AC
  Administered 2022-10-10: 10 mg via INTRAVENOUS
  Filled 2022-10-10: qty 2

## 2022-10-10 NOTE — Discharge Instructions (Signed)
Take acetaminophen 650 mg and ibuprofen 400 mg every 6 hours for headache as needed.  Take with food.  Call your doctor this week to schedule an appointment to discuss blood pressure management.  If you have any new, worsening, unexpected symptoms come back to the emergency department for recheck.

## 2022-10-10 NOTE — ED Provider Notes (Signed)
[facility] Provider Note    Event Date/Time   First MD Initiated Contact with Patient 10/10/22 0139     (approximate)   History   No chief complaint on file.   HPI  Pam Snyder is a 57 y.o. female   Past medical history of hypertension migraine headaches, prior MI, prior stroke, who presents to the emergency department with high blood pressure and headache right-sided.  She has had a headache for 2 days.  No visual changes.  Feels like her migraines in the past but she has not had one for several years.  No trauma.  No fever or chills.  She also notes that she has had bilateral calf soreness over the last several days.  She does take a statin.    No other acute medical complaints.    External Medical Documents Reviewed: Emergency department visit in October 2023 for leg cramping with negative DVT ultrasound      Physical Exam   Triage Vital Signs: ED Triage Vitals  Enc Vitals Group     BP 10/09/22 2237 (!) 144/78     Pulse Rate 10/09/22 2237 65     Resp 10/09/22 2237 17     Temp 10/09/22 2237 98.2 F (36.8 C)     Temp Source 10/09/22 2237 Oral     SpO2 10/09/22 2237 99 %     Weight 10/09/22 2233 200 lb (90.7 kg)     Height 10/09/22 2233 5\' 8"  (1.727 m)     Head Circumference --      Peak Flow --      Pain Score 10/09/22 2233 9     Pain Loc --      Pain Edu? --      Excl. in GC? --     Most recent vital signs: Vitals:   10/09/22 2237 10/10/22 0247  BP: (!) 144/78 (!) 144/69  Pulse: 65 69  Resp: 17 20  Temp: 98.2 F (36.8 C)   SpO2: 99% 99%    General: Awake, no distress.  CV:  Good peripheral perfusion.  Resp:  Normal effort.  Abd:  No distention.  Other:  Awake comfortable appearing nontoxic neck supple with full range of motion.  No temporal tenderness.  No focal neurologic deficits including dysarthria facial asymmetry motor or sensory deficits and finger-to-nose.  Lungs clear abdomen soft nontender skin appears warm  well-perfused.  She does have bilateral calf tenderness to palpation which is mild there is no overlying signs of infection neurovascular intact and no unilateral changes   ED Results / Procedures / Treatments   Labs (all labs ordered are listed, but only abnormal results are displayed) Labs Reviewed  CBC WITH DIFFERENTIAL/PLATELET - Abnormal; Notable for the following components:      Result Value   WBC 3.4 (*)    Hemoglobin 11.9 (*)    MCV 73.8 (*)    MCH 23.4 (*)    All other components within normal limits  COMPREHENSIVE METABOLIC PANEL - Abnormal; Notable for the following components:   Glucose, Bld 178 (*)    All other components within normal limits  CK  TROPONIN I (HIGH SENSITIVITY)     I ordered and reviewed the above labs they are notable for white blood cell count is 3.4 and hemoglobin 11.9.  Glucose is 178    RADIOLOGY I independently reviewed and interpreted CT of the head see no obvious bleeding or midline shift   PROCEDURES:  Critical Care  performed: No  Procedures   MEDICATIONS ORDERED IN ED: Medications  ketorolac (TORADOL) 15 MG/ML injection 15 mg (15 mg Intravenous Given 10/10/22 0245)  metoCLOPramide (REGLAN) injection 10 mg (10 mg Intravenous Given 10/10/22 0246)  diphenhydrAMINE (BENADRYL) injection 25 mg (25 mg Intravenous Given 10/10/22 0245)  sodium chloride 0.9 % bolus 500 mL (500 mLs Intravenous New Bag/Given 10/10/22 0243)      IMPRESSION / MDM / ASSESSMENT AND PLAN / ED COURSE  I reviewed the triage vital signs and the nursing notes.                                Patient's presentation is most consistent with acute presentation with potential threat to life or bodily function.  Differential diagnosis includes, but is not limited to, migraine headache, tension type headache, temporal arteritis, CVA, intracranial bleeding, hypertension, myositis, DVT   The patient is on the cardiac monitor to evaluate for evidence of arrhythmia and/or  significant heart rate changes.  MDM: This is a patient with migraine headache and hypertension.  Blood pressure is not markedly elevated.  CT of the head negative for intracranial bleeding or other emergent pathologies.  Most consistent with migraine headaches were given a migraine cocktail with complete resolution of headache.  No temporal tenderness or visual changes to suggest vasculitic pathologies.  Denies any chest pain to suggest ACS in the setting of her hypertension.  Given unremarkable workup as above and complete resolution of chief complaint headache with migraine cocktail think it safe to have her follow-up with PMD this week and discharge at this time         FINAL CLINICAL IMPRESSION(S) / ED DIAGNOSES   Final diagnoses:  Nonintractable headache, unspecified chronicity pattern, unspecified headache type  Uncontrolled hypertension     Rx / DC Orders   ED Discharge Orders     None        Note:  This document was prepared using Dragon voice recognition software and may include unintentional dictation errors.    Pilar Jarvis, MD 10/10/22 [phone]

## 2022-11-23 ENCOUNTER — Ambulatory Visit
Admission: EM | Admit: 2022-11-23 | Discharge: 2022-11-23 | Disposition: A | Discharge status: Home / Self Care | Payer: Medicaid Other

## 2022-11-23 DIAGNOSIS — M62838 Other muscle spasm: Secondary | ICD-10-CM

## 2022-11-23 DIAGNOSIS — M79604 Pain in right leg: Secondary | ICD-10-CM | POA: Diagnosis not present

## 2022-11-23 NOTE — Discharge Instructions (Signed)
-  Condition not consistent with cellulitis and very low suspicion for blood clot. - Appears to be most consistent with pinched nerve or muscle spasms.  Take your cyclobenzaprine/Flexeril at home if you have it and apply ice to the area of heat is not helpful.  Tylenol as needed for pain relief. - If you feel that your pain is getting worse or not improving over the next week then please follow-up with your primary care provider. -If you notice that your skin is hot to touch, tight, increasingly swollen or the pain worsens then please go to the ER.

## 2022-11-23 NOTE — ED Provider Notes (Signed)
[facility]    CSN: [id number] Arrival date & time: 11/23/22  1708      History   Chief Complaint Chief Complaint  Patient presents with   Leg Pain    HPI Pam Snyder is a 57 y.o. female with history of sickle cell trait, previous stroke 15 years ago, anemia, previous MI, degenerative lumbar disc disease with history of sciatica.  Patient presents today for 2-week history of burning sensation of the right posterior thigh she says she just noticed that it was sore to touch over the past day.  She does have chronic lower back pain and sciatica problems as noted.  She states that someone told her she might have infected cellulite or cellulitis.  She denies any history of PE or DVT and has no associated swelling, redness or pain down the leg.  No numbness or tingling.  Has been taking Tylenol.  Tried applying heat but thinks it made it worse.  Denies injury.  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   Stroke (HCC)    15 years ago, no deficits    Patient Active Problem List   Diagnosis Date Noted   Pancreatitis, acute 10/14/2021   HTN (hypertension) 10/14/2021   HLD (hyperlipidemia) 10/14/2021   Pancreatitis 10/14/2021   Chest pain 04/04/2018   DDD (degenerative disc disease), cervical 01/12/2017   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   Internal hernia    Abdominal pain    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   Chest wall pain  01/28/2016    Past Surgical History:  Procedure Laterality Date   CARDIAC CATHETERIZATION     x3 last in 2014, device implanted to help MVP   ENDOMETRIAL ABLATION  2014   [facility]   EPIGASTRIC HERNIA REPAIR  01/04/2017   Procedure: HERNIA REPAIR;  Surgeon: Leafy Ro, MD;  Location: [facility] ORS;  Service: General;;   fibroid cyst     LAPAROTOMY N/A 01/04/2017   Procedure: EXPLORATORY LAPAROTOMY;  Surgeon: Leafy Ro, MD;  Location: [facility] ORS;  Service: General;  Laterality: N/A;   LYSIS OF ADHESION  01/04/2017   Procedure: LYSIS OF ADHESION;  Surgeon: Leafy Ro, MD;  Location: [facility] ORS;  Service: General;;    OB History     Gravida  10   Para  5   Term      Preterm      AB  5   Living  5      SAB  2   IAB      Ectopic      Multiple      Live Births               Home Medications    Prior to Admission medications   Medication Sig Start Date End Date Taking? Authorizing Provider  albuterol (  VENTOLIN HFA) 108 (90 Base) MCG/ACT inhaler Inhale 2 puffs into the lungs every 6 (six) hours as needed for wheezing or shortness of breath. Patient not taking: Reported on 02/06/2022 05/22/21   Nita Sickle, MD  benzonatate (TESSALON PERLES) 100 MG capsule Take 1 capsule (100 mg total) by mouth 3 (three) times daily as needed for cough. Patient not taking: Reported on 02/06/2022 12/23/21   Poggi, Eileen Stanford E, PA-C  [employer] ASPIRIN ADULT LOW DOSE 81 MG chewable tablet Chew 81 mg by mouth daily. 12/18/21   [provider]  hydrochlorothiazide (HYDRODIURIL) 25 MG tablet Take 25 mg by mouth daily. Patient not taking: Reported on 02/06/2022 09/23/21   [provider]  metFORMIN (GLUCOPHAGE) 500 MG tablet Take 500 mg by mouth 2 (two) times daily. Patient not taking: Reported on 02/06/2022 09/23/21   [provider]  metoprolol tartrate (LOPRESSOR) 100 MG tablet Take 1 tablet (100 mg total) by mouth once for 1 dose. Take 2 hours prior to your CT scan. 02/06/22  02/06/22  Debbe Odea, MD  polyethylene glycol powder (GLYCOLAX/MIRALAX) 17 GM/SCOOP powder 2 cap fulls in a full glass of water, three times a day, for 5 days. Patient not taking: Reported on 02/06/2022 02/01/22   Sharman Cheek, MD  pravastatin (PRAVACHOL) 20 MG tablet Take 1 tablet (20 mg total) by mouth daily. 03/26/22   Debbe Odea, MD    Family History Family History  Problem Relation Age of Onset   Lung cancer Mother    Leukemia Father    Sickle cell anemia Daughter    Breast cancer Maternal Aunt 58   Breast cancer Maternal Aunt 50   Colon cancer Maternal Grandmother    Sickle cell anemia Brother     Social History Social History   Tobacco Use   Smoking status: Never   Smokeless tobacco: Never  Vaping Use   Vaping Use: Never used  Substance Use Topics   Alcohol use: No   Drug use: No     Allergies   No known allergies   Review of Systems Review of Systems  Constitutional:  Negative for fatigue and fever.  Musculoskeletal:  Positive for arthralgias and back pain. Negative for gait problem and joint swelling.  Skin:  Negative for color change and wound.  Neurological:  Negative for weakness and numbness.     Physical Exam Triage Vital Signs ED Triage Vitals [11/23/22 1741]  Enc Vitals Group     BP (!) 143/84     Pulse Rate 65     Resp 16     Temp 98.3 F (36.8 C)     Temp Source Oral     SpO2 100 %     Weight      Height      Head Circumference      Peak Flow      Pain Score 10     Pain Loc      Pain Edu?      Excl. in GC?    No data found.  Updated Vital Signs BP (!) 143/84 (BP Location: Left Arm)   Pulse 65   Temp 98.3 F (36.8 C) (Oral)   Resp 16   LMP  (LMP Unknown) Comment: June 2020  SpO2 100%      Physical Exam Vitals and nursing note reviewed.  Constitutional:      General: She is not in acute distress.    Appearance: Normal appearance. She is not ill-appearing or toxic-appearing.  HENT:  Head:  Normocephalic and atraumatic.  Eyes:     General: No scleral icterus.       Right eye: No discharge.        Left eye: No discharge.     Conjunctiva/sclera: Conjunctivae normal.  Cardiovascular:     Rate and Rhythm: Normal rate and regular rhythm.     Heart sounds: Normal heart sounds.  Pulmonary:     Effort: Pulmonary effort is normal. No respiratory distress.     Breath sounds: Normal breath sounds.  Musculoskeletal:     Cervical back: Neck supple.     Lumbar back: Tenderness (TTP bilateral paralumbar muscles and gluts) present. No bony tenderness. Decreased range of motion.     Comments: Right Leg: No swelling, erythema, lacerations, abrasions, contusions.  There is an area of tenderness to the posterior thigh but this area is not swollen or abnormal appearing.  Skin:    General: Skin is dry.  Neurological:     General: No focal deficit present.     Mental Status: She is alert. Mental status is at baseline.     Motor: No weakness.     Gait: Gait normal.  Psychiatric:        Mood and Affect: Mood normal.        Behavior: Behavior normal.        Thought Content: Thought content normal.      UC Treatments / Results  Labs (all labs ordered are listed, but only abnormal results are displayed) Labs Reviewed - No data to display  EKG   Radiology No results found.  Procedures Procedures (including critical care time)  Medications Ordered in UC Medications - No data to display  Initial Impression / Assessment and Plan / UC Course  I have reviewed the triage vital signs and the nursing notes.  Pertinent labs & imaging results that were available during my care of the patient were reviewed by me and considered in my medical decision making (see chart for details).   57 year old female presents for 2-week history of posterior right thigh pain/burning sensation.  Over the past day the area has felt tender to touch.  She is concerned about cellulitis but does state the skin  does not look any different.  No history of DVT or PE.  As taken Tylenol without relief.  Has a history of chronic back pain and lumbar disc degeneration.  On exam no evidence of abnormal appearance.  Presentation most consistent with musculoskeletal pain, muscle spasm.  Could be related to chronically inflamed nerve.  Patient has cyclobenzaprine at home.  Advised her to try this.  Advised if he does not working to try ice and continue Tylenol for pain.  Follow-up with PCP if symptoms are worsening or not improving over the next 1 week.  Discussed return and ER precautions.   Final Clinical Impressions(s) / UC Diagnoses   Final diagnoses:  Right leg pain  Muscle spasm of right leg     Discharge Instructions      -Condition not consistent with cellulitis and very low suspicion for blood clot. - Appears to be most consistent with pinched nerve or muscle spasms.  Take your cyclobenzaprine/Flexeril at home if you have it and apply ice to the area of heat is not helpful.  Tylenol as needed for pain relief. - If you feel that your pain is getting worse or not improving over the next week then please follow-up with your primary care provider. -If you notice that  your skin is hot to touch, tight, increasingly swollen or the pain worsens then please go to the ER.    ED Prescriptions   None    PDMP not reviewed this encounter.   Shirlee Latch, PA-C 11/23/22 [phone]

## 2022-11-23 NOTE — ED Triage Notes (Signed)
Patient presents to UC for right thigh pain x 14 days. States pocket of fluid in right thigh. Concerned with cellulitis. Treating pain with tylenol.

## 2023-01-27 ENCOUNTER — Ambulatory Visit
Admission: EM | Admit: 2023-01-27 | Discharge: 2023-01-27 | Disposition: A | Discharge status: Home / Self Care | Payer: Medicaid Other | Attending: Physician Assistant | Admitting: Physician Assistant

## 2023-01-27 DIAGNOSIS — R051 Acute cough: Secondary | ICD-10-CM | POA: Insufficient documentation

## 2023-01-27 DIAGNOSIS — U071 COVID-19: Secondary | ICD-10-CM | POA: Diagnosis present

## 2023-01-27 DIAGNOSIS — J029 Acute pharyngitis, unspecified: Secondary | ICD-10-CM | POA: Insufficient documentation

## 2023-01-27 LAB — SARS CORONAVIRUS 2 BY RT PCR: SARS Coronavirus 2 by RT PCR: POSITIVE — AB

## 2023-01-27 LAB — GROUP A STREP BY PCR: Group A Strep by PCR: NOT DETECTED

## 2023-01-27 MED ORDER — NIRMATRELVIR/RITONAVIR (PAXLOVID)TABLET: 3.0000 | ORAL_TABLET | Freq: Two times a day (BID) | ORAL | 0 refills | Status: AC

## 2023-01-27 MED ORDER — PROMETHAZINE-DM 6.25-15 MG/5ML PO SYRP: 5.0000 mL | ORAL_SOLUTION | Freq: Four times a day (QID) | ORAL | 0 refills | Status: DC | PRN

## 2023-01-27 NOTE — Discharge Instructions (Addendum)
-  Positive COVID test.  Isolate until you are fever free for 24 hours and your symptoms are improving.  This is usually about 5 days or so.  As long as you are coughing wear a mask. - I sent antiviral medication to the pharmacy.  This will not help your symptoms but it is used to prevent you from getting sicker.  Do not take your statin cholesterol medication for 10 days. - Increase rest and fluids. - Continue Tylenol as needed for fever. - You should be seen again if you have uncontrollable fever, weakness or breathing trouble.

## 2023-01-27 NOTE — ED Triage Notes (Addendum)
cough,congestion and facial swelling x2d sore throat. Chest hurts when she coughs.

## 2023-01-27 NOTE — ED Provider Notes (Signed)
[facility]    CSN: [id number] Arrival date & time: 01/27/23  1132      History   Chief Complaint Chief Complaint  Patient presents with   Cough   Facial Swelling   Nasal Congestion   Sore Throat    HPI Pam Snyder is a 57 y.o. female presenting for approximately 2-day history of low-grade fever, fatigue, cough, congestion, sore throat, right-sided ear pressure.  Reports chest pain when coughing.  Denies any shortness of breath, vomiting or diarrhea.  Denies any sick contacts or known exposure to COVID and does request a COVID test.  Has been taking Tylenol over-the-counter.  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   Stroke (HCC)    15 years ago, no deficits    Patient Active Problem List   Diagnosis Date Noted   Pancreatitis, acute 10/14/2021   HTN (hypertension) 10/14/2021   HLD (hyperlipidemia) 10/14/2021   Pancreatitis 10/14/2021   Chest pain 04/04/2018   DDD (degenerative disc disease), cervical 01/12/2017   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   Internal hernia    Abdominal pain    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   Chest wall pain 01/28/2016    Past Surgical History:  Procedure Laterality Date   CARDIAC CATHETERIZATION     x3 last in 2014, device implanted to help MVP   ENDOMETRIAL ABLATION  2014   [facility]   EPIGASTRIC HERNIA REPAIR  01/04/2017   Procedure: HERNIA REPAIR;  Surgeon: Leafy Ro, MD;   Location: [facility] ORS;  Service: General;;   fibroid cyst     LAPAROTOMY N/A 01/04/2017   Procedure: EXPLORATORY LAPAROTOMY;  Surgeon: Leafy Ro, MD;  Location: [facility] ORS;  Service: General;  Laterality: N/A;   LYSIS OF ADHESION  01/04/2017   Procedure: LYSIS OF ADHESION;  Surgeon: Leafy Ro, MD;  Location: [facility] ORS;  Service: General;;    OB History     Gravida  10   Para  5   Term      Preterm      AB  5   Living  5      SAB  2   IAB      Ectopic      Multiple      Live Births               Home Medications    Prior to Admission medications   Medication Sig Start Date End Date Taking? Authorizing Provider  [facility] ASPIRIN ADULT LOW DOSE 81 MG chewable tablet Chew 81 mg by mouth daily. 12/18/21  Yes [provider]  fluticasone (FLONASE) 50 MCG/ACT nasal spray Place 2 sprays into both nostrils daily. 09/10/22  Yes [provider]  JARDIANCE 25 MG TABS tablet Take 25 mg by mouth daily. 01/21/23  Yes [provider]  ketorolac (TORADOL) 10 MG tablet Take 10 mg by mouth every 8 (eight) hours as needed. 10/24/22  Yes [provider]  loratadine (CLARITIN) 10 MG tablet Take 10 mg by mouth daily. 01/04/23  Yes [provider]  nirmatrelvir/ritonavir (PAXLOVID) 20 x 150 MG & 10 x 100MG  TABS Take 3 tablets by mouth 2 (two) times daily for 5 days. Patient GFR is greater than 60. Take nirmatrelvir (150 mg) two tablets twice daily for 5 days and ritonavir (100 mg) one tablet twice daily for 5 days. 01/27/23 02/01/23 Yes Shirlee Latch, PA-C  promethazine-dextromethorphan (PROMETHAZINE-DM) 6.25-15 MG/5ML syrup Take 5 mLs by mouth 4 (four) times daily as needed. 01/27/23  Yes Shirlee Latch, PA-C  albuterol (VENTOLIN HFA) 108 (90 Base) MCG/ACT inhaler Inhale 2 puffs into the lungs every 6 (six) hours as needed for wheezing or shortness of breath. Patient not taking: Reported on 02/06/2022 05/22/21   Nita Sickle, MD  benzonatate  (TESSALON PERLES) 100 MG capsule Take 1 capsule (100 mg total) by mouth 3 (three) times daily as needed for cough. Patient not taking: Reported on 02/06/2022 12/23/21   Poggi, Herb Grays, PA-C  hydrochlorothiazide (HYDRODIURIL) 25 MG tablet Take 25 mg by mouth daily. Patient not taking: Reported on 02/06/2022 09/23/21   [provider]  lisinopril (ZESTRIL) 2.5 MG tablet Take 2.5 mg by mouth daily. 12/21/22   [provider]  metFORMIN (GLUCOPHAGE) 500 MG tablet Take 500 mg by mouth 2 (two) times daily. Patient not taking: Reported on 02/06/2022 09/23/21   [provider]  metoprolol tartrate (LOPRESSOR) 100 MG tablet Take 1 tablet (100 mg total) by mouth once for 1 dose. Take 2 hours prior to your CT scan. 02/06/22 02/06/22  Debbe Odea, MD  polyethylene glycol powder (GLYCOLAX/MIRALAX) 17 GM/SCOOP powder 2 cap fulls in a full glass of water, three times a day, for 5 days. Patient not taking: Reported on 02/06/2022 02/01/22   Sharman Cheek, MD  pravastatin (PRAVACHOL) 20 MG tablet Take 1 tablet (20 mg total) by mouth daily. 03/26/22   Debbe Odea, MD    Family History Family History  Problem Relation Age of Onset   Lung cancer Mother    Leukemia Father    Sickle cell anemia Daughter    Breast cancer Maternal Aunt 44   Breast cancer Maternal Aunt 50   Colon cancer Maternal Grandmother    Sickle cell anemia Brother     Social History Social History   Tobacco Use   Smoking status: Never   Smokeless tobacco: Never  Vaping Use   Vaping status: Never Used  Substance Use Topics   Alcohol use: No   Drug use: No     Allergies   No known allergies   Review of Systems Review of Systems  Constitutional:  Positive for fatigue and fever (low grade). Negative for chills and diaphoresis.  HENT:  Positive for congestion, rhinorrhea and sore throat. Negative for ear pain, sinus pressure and sinus pain.   Respiratory:  Positive for cough. Negative for shortness  of breath.   Cardiovascular:  Positive for chest pain.  Gastrointestinal:  Negative for abdominal pain, nausea and vomiting.  Musculoskeletal:  Negative for arthralgias and myalgias.  Skin:  Negative for rash.  Neurological:  Negative for weakness and headaches.  Hematological:  Negative for adenopathy.     Physical Exam Triage Vital Signs ED Triage Vitals [01/27/23 1150]  Encounter Vitals Group     BP  Systolic BP Percentile      Diastolic BP Percentile      Pulse      Resp      Temp      Temp src      SpO2      Weight 197 lb (89.4 kg)     Height      Head Circumference      Peak Flow      Pain Score 10     Pain Loc      Pain Education      Exclude from Growth Chart    No data found.  Updated Vital Signs BP (!) 151/90 (BP Location: Left Arm)   Pulse 81   Temp 98.2 F (36.8 C) (Oral)   Resp 19   Wt 197 lb (89.4 kg)   LMP  (LMP Unknown) Comment: June 2020  SpO2 100%   BMI 29.95 kg/m   Physical Exam Vitals and nursing note reviewed.  Constitutional:      General: She is not in acute distress.    Appearance: Normal appearance. She is not ill-appearing or toxic-appearing.  HENT:     Head: Normocephalic and atraumatic.     Right Ear: Tympanic membrane, ear canal and external ear normal.     Left Ear: Ear canal and external ear normal. A middle ear effusion is present.     Nose: Congestion present.     Mouth/Throat:     Mouth: Mucous membranes are moist.     Pharynx: Oropharynx is clear. Posterior oropharyngeal erythema present.  Eyes:     General: No scleral icterus.       Right eye: No discharge.        Left eye: No discharge.     Conjunctiva/sclera: Conjunctivae normal.  Cardiovascular:     Rate and Rhythm: Normal rate and regular rhythm.     Heart sounds: Normal heart sounds.  Pulmonary:     Effort: Pulmonary effort is normal. No respiratory distress.     Breath sounds: Normal breath sounds.  Musculoskeletal:     Cervical back: Neck supple.   Skin:    General: Skin is dry.  Neurological:     General: No focal deficit present.     Mental Status: She is alert. Mental status is at baseline.     Motor: No weakness.     Gait: Gait normal.  Psychiatric:        Mood and Affect: Mood normal.        Behavior: Behavior normal.        Thought Content: Thought content normal.      UC Treatments / Results  Labs (all labs ordered are listed, but only abnormal results are displayed) Labs Reviewed  SARS CORONAVIRUS 2 BY RT PCR - Abnormal; Notable for the following components:      Result Value   SARS Coronavirus 2 by RT PCR POSITIVE (*)    All other components within normal limits  GROUP A STREP BY PCR    EKG   Radiology No results found.  Procedures Procedures (including critical care time)  Medications Ordered in UC Medications - No data to display  Initial Impression / Assessment and Plan / UC Course  I have reviewed the triage vital signs and the nursing notes.  Pertinent labs & imaging results that were available during my care of the patient were reviewed by me and considered in my medical decision making (see chart for details).   57 year old  female presents for 2-day history of cough, congestion, right-sided ear pressure/fullness and low-grade fever.  Patient is afebrile.  Overall well-appearing.  On exam has nasal congestion, posterior pharyngeal erythema.  Chest clear.  COVID and strep testing obtained. Positive.   Reviewed all results with patient.  Reviewed current CDC guidelines, isolation protocol and ED precautions.  Patient interested in Paxlovid.  Reviewed previous lab work in May.  Sent Paxlovid to pharmacy.  Also sent Promethazine DM.  Work note given   Final Clinical Impressions(s) / UC Diagnoses   Final diagnoses:  COVID-19  Acute cough  Pharyngitis, unspecified etiology     Discharge Instructions      -Positive COVID test.  Isolate until you are fever free for 24 hours and your  symptoms are improving.  This is usually about 5 days or so.  As long as you are coughing wear a mask. - I sent antiviral medication to the pharmacy.  This will not help your symptoms but it is used to prevent you from getting sicker.  Do not take your statin cholesterol medication for 10 days. - Increase rest and fluids. - Continue Tylenol as needed for fever. - You should be seen again if you have uncontrollable fever, weakness or breathing trouble.     ED Prescriptions     Medication Sig Dispense Auth. Provider   nirmatrelvir/ritonavir (PAXLOVID) 20 x 150 MG & 10 x 100MG  TABS Take 3 tablets by mouth 2 (two) times daily for 5 days. Patient GFR is greater than 60. Take nirmatrelvir (150 mg) two tablets twice daily for 5 days and ritonavir (100 mg) one tablet twice daily for 5 days. 30 tablet Shirlee Latch, PA-C   promethazine-dextromethorphan (PROMETHAZINE-DM) 6.25-15 MG/5ML syrup Take 5 mLs by mouth 4 (four) times daily as needed. 118 mL Shirlee Latch, PA-C      PDMP not reviewed this encounter.   Shirlee Latch, PA-C 01/27/23 1236

## 2023-04-19 ENCOUNTER — Encounter: Payer: Self-pay | Admitting: Emergency Medicine

## 2023-04-19 ENCOUNTER — Other Ambulatory Visit: Payer: Self-pay

## 2023-04-19 ENCOUNTER — Emergency Department
Admission: EM | Admit: 2023-04-19 | Discharge: 2023-04-19 | Disposition: A | Discharge status: Home / Self Care | Payer: Medicaid Other | Attending: Emergency Medicine | Admitting: Emergency Medicine

## 2023-04-19 DIAGNOSIS — M792 Neuralgia and neuritis, unspecified: Secondary | ICD-10-CM | POA: Diagnosis not present

## 2023-04-19 DIAGNOSIS — R109 Unspecified abdominal pain: Secondary | ICD-10-CM | POA: Diagnosis present

## 2023-04-19 LAB — URINALYSIS, ROUTINE W REFLEX MICROSCOPIC
Bilirubin Urine: NEGATIVE
Glucose, UA: 150 mg/dL — AB
Hgb urine dipstick: NEGATIVE
Ketones, ur: NEGATIVE mg/dL
Leukocytes,Ua: NEGATIVE
Nitrite: NEGATIVE
Protein, ur: NEGATIVE mg/dL
Specific Gravity, Urine: 1.028 (ref 1.005–1.030)
pH: 5 (ref 5.0–8.0)

## 2023-04-19 LAB — CBC
HCT: 37.3 % (ref 36.0–46.0)
Hemoglobin: 12 g/dL (ref 12.0–15.0)
MCH: 23.6 pg — ABNORMAL LOW (ref 26.0–34.0)
MCHC: 32.2 g/dL (ref 30.0–36.0)
MCV: 73.3 fL — ABNORMAL LOW (ref 80.0–100.0)
Platelets: 235 10*3/uL (ref 150–400)
RBC: 5.09 MIL/uL (ref 3.87–5.11)
RDW: 14.6 % (ref 11.5–15.5)
WBC: 3.8 10*3/uL — ABNORMAL LOW (ref 4.0–10.5)
nRBC: 0 % (ref 0.0–0.2)

## 2023-04-19 LAB — BASIC METABOLIC PANEL
Anion gap: 6 (ref 5–15)
BUN: 11 mg/dL (ref 6–20)
CO2: 25 mmol/L (ref 22–32)
Calcium: 9 mg/dL (ref 8.9–10.3)
Chloride: 104 mmol/L (ref 98–111)
Creatinine, Ser: 0.61 mg/dL (ref 0.44–1.00)
GFR, Estimated: >60 mL/min (ref >60)
Glucose, Bld: 236 mg/dL — ABNORMAL HIGH (ref 70–99)
Potassium: 3.9 mmol/L (ref 3.5–5.1)
Sodium: 135 mmol/L (ref 135–145)

## 2023-04-19 MED ORDER — GABAPENTIN 100 MG PO CAPS: 100.0000 mg | ORAL_CAPSULE | Freq: Three times a day (TID) | ORAL | 2 refills | Status: AC

## 2023-04-19 MED ORDER — MORPHINE SULFATE (PF) 4 MG/ML IV SOLN
4.0000 mg | Freq: Once | INTRAVENOUS | Status: DC
  Filled 2023-04-19: qty 1

## 2023-04-19 NOTE — ED Triage Notes (Signed)
Patient to ED via POV for right sided flank pain. Seen at [facility] on Saturday for same. States pain radiates down leg but also having and abd pain when eating and urinating.

## 2023-04-19 NOTE — ED Provider Notes (Signed)
[facility] Provider Note    Event Date/Time   First MD Initiated Contact with Patient 04/19/23 1620     (approximate)  History   Chief Complaint: Flank Pain (/)  HPI  Pam Snyder is a 57 y.o. female with a past medical history of abdominal pain, neuropathy, sciatica, degenerative disc disease, presents to the emergency department for right flank pain.  According to the patient for the past several weeks she has been experiencing pain in the right flank extending down into the right leg at times.  Patient was seen at [facility] emergency department this week for the same.  Had an extensive workup performed including lab work and a CT with and without contrast of the chest abdomen and pelvis with no significant findings.  I reviewed this in care everywhere.  Here patient continues to state right flank pain.  No known history of kidney stones.  No urinary symptoms.  Although states that urinalysis was not checked at [facility].  Physical Exam   Triage Vital Signs: ED Triage Vitals  Encounter Vitals Group     BP 04/19/23 1523 (!) 150/77     Systolic BP Percentile --      Diastolic BP Percentile --      Pulse Rate 04/19/23 1523 76     Resp 04/19/23 1523 18     Temp 04/19/23 1523 98 F (36.7 C)     Temp Source 04/19/23 1523 Oral     SpO2 04/19/23 1523 98 %     Weight 04/19/23 1522 200 lb (90.7 kg)     Height 04/19/23 1522 5\' 8"  (1.727 m)     Head Circumference --      Peak Flow --      Pain Score 04/19/23 1522 10     Pain Loc --      Pain Education --      Exclude from Growth Chart --     Most recent vital signs: Vitals:   04/19/23 1523  BP: (!) 150/77  Pulse: 76  Resp: 18  Temp: 98 F (36.7 C)  SpO2: 98%    General: Awake, no distress.  CV:  Good peripheral perfusion.  Regular rate and rhythm  Resp:  Normal effort.  Equal breath sounds bilaterally.  Abd:  No distention.  Soft, mild right sided abdominal tenderness to palpation.  No rebound or  guarding.  ED Results / Procedures / Treatments   MEDICATIONS ORDERED IN ED: Medications  morphine (PF) 4 MG/ML injection 4 mg (has no administration in time range)     IMPRESSION / MDM / ASSESSMENT AND PLAN / ED COURSE  I reviewed the triage vital signs and the nursing notes.  Patient's presentation is most consistent with acute presentation with potential threat to life or bodily function.  Patient presents emergency department for right flank pain extending down into the right leg.  History of sciatica as well as neuropathy but states this feels somewhat different.  Patient states she has had this pain before, it has worsened over the past 1 week.  Patient was seen at [facility] and had a CT scan of the chest abdomen and pelvis with and without contrast in addition to lab work.  I reviewed the findings with no significant findings during her visit.  We will check labs as well as urinalysis.  Patient's labs today show a reassuring CBC, reassuring chemistry.  Urinalysis is pending.  We will treat pain, given the recent extensive CT imaging that  was negative I do not believe repeat CT imaging would be of much utility especially with reassuring lab work.  If the patient's urinalysis does not show a significant finding anticipate likely discharge home with a short course of pain medication have the patient follow-up with her doctor.  She states she is already called to make an appointment.  Patient's urinalysis is normal no infection or blood.  We will discharge patient on gabapentin to help treat possible neuropathic pain.  Have the patient follow-up with her doctor.  Patient is agreeable to this plan of care.  FINAL CLINICAL IMPRESSION(S) / ED DIAGNOSES   Right flank pain Neuropathic pain   Note:  This document was prepared using Dragon voice recognition software and may include unintentional dictation errors.   Minna Antis, MD 04/19/23 1825

## 2023-04-19 NOTE — ED Notes (Signed)
Pt stated pain was a 10/10 but wanted to refuse the morphine as she was given is this past Saturday at the ED at [facility] and stated it did not help with the pain. MD made aware.

## 2023-05-10 ENCOUNTER — Inpatient Hospital Stay: Payer: Medicaid Other | Admitting: Oncology

## 2023-05-10 ENCOUNTER — Inpatient Hospital Stay: Payer: Medicaid Other

## 2023-05-12 ENCOUNTER — Inpatient Hospital Stay: Payer: Medicaid Other

## 2023-05-12 ENCOUNTER — Encounter: Payer: Self-pay | Admitting: Oncology

## 2023-05-12 ENCOUNTER — Inpatient Hospital Stay: Payer: Medicaid Other | Attending: Oncology | Admitting: Oncology

## 2023-05-12 VITALS — BP 134/78 | HR 88 | Temp 97.6°F | Resp 18 | Wt 200.0 lb

## 2023-05-12 DIAGNOSIS — E119 Type 2 diabetes mellitus without complications: Secondary | ICD-10-CM | POA: Insufficient documentation

## 2023-05-12 DIAGNOSIS — D72819 Decreased white blood cell count, unspecified: Secondary | ICD-10-CM | POA: Diagnosis present

## 2023-05-12 DIAGNOSIS — R718 Other abnormality of red blood cells: Secondary | ICD-10-CM

## 2023-05-12 DIAGNOSIS — Z809 Family history of malignant neoplasm, unspecified: Secondary | ICD-10-CM | POA: Insufficient documentation

## 2023-05-12 DIAGNOSIS — Z803 Family history of malignant neoplasm of breast: Secondary | ICD-10-CM | POA: Insufficient documentation

## 2023-05-12 DIAGNOSIS — D573 Sickle-cell trait: Secondary | ICD-10-CM | POA: Insufficient documentation

## 2023-05-12 DIAGNOSIS — Z800 Family history of malignant neoplasm of digestive organs: Secondary | ICD-10-CM | POA: Insufficient documentation

## 2023-05-12 DIAGNOSIS — Z139 Encounter for screening, unspecified: Secondary | ICD-10-CM

## 2023-05-12 LAB — TECHNOLOGIST SMEAR REVIEW
Plt Morphology: NORMAL
RBC MORPHOLOGY: NORMAL
WBC MORPHOLOGY: NORMAL

## 2023-05-12 LAB — IRON AND TIBC
Iron: 92 ug/dL (ref 28–170)
Saturation Ratios: 23 % (ref 10.4–31.8)
TIBC: 400 ug/dL (ref 250–450)
UIBC: 308 ug/dL

## 2023-05-12 LAB — VITAMIN B12: Vitamin B-12: 513 pg/mL (ref 180–914)

## 2023-05-12 LAB — HEPATITIS PANEL, ACUTE
HCV Ab: NONREACTIVE
Hep A IgM: NONREACTIVE
Hep B C IgM: NONREACTIVE
Hepatitis B Surface Ag: NONREACTIVE

## 2023-05-12 LAB — CBC WITH DIFFERENTIAL/PLATELET
Abs Immature Granulocytes: 0.02 10*3/uL (ref 0.00–0.07)
Basophils Absolute: 0 10*3/uL (ref 0.0–0.1)
Basophils Relative: 0 %
Eosinophils Absolute: 0.1 10*3/uL (ref 0.0–0.5)
Eosinophils Relative: 1 %
HCT: 38.6 % (ref 36.0–46.0)
Hemoglobin: 12.1 g/dL (ref 12.0–15.0)
Immature Granulocytes: 0 %
Lymphocytes Relative: 31 %
Lymphs Abs: 1.5 10*3/uL (ref 0.7–4.0)
MCH: 23.1 pg — ABNORMAL LOW (ref 26.0–34.0)
MCHC: 31.3 g/dL (ref 30.0–36.0)
MCV: 73.7 fL — ABNORMAL LOW (ref 80.0–100.0)
Monocytes Absolute: 0.4 10*3/uL (ref 0.1–1.0)
Monocytes Relative: 9 %
Neutro Abs: 2.7 10*3/uL (ref 1.7–7.7)
Neutrophils Relative %: 59 %
Platelets: 264 10*3/uL (ref 150–400)
RBC: 5.24 MIL/uL — ABNORMAL HIGH (ref 3.87–5.11)
RDW: 15.3 % (ref 11.5–15.5)
WBC: 4.7 10*3/uL (ref 4.0–10.5)
nRBC: 0 % (ref 0.0–0.2)

## 2023-05-12 LAB — LACTATE DEHYDROGENASE: LDH: 144 U/L (ref 98–192)

## 2023-05-12 LAB — FOLATE: Folate: 14.9 ng/mL (ref >5.9)

## 2023-05-12 LAB — FERRITIN: Ferritin: 47 ng/mL (ref 11–307)

## 2023-05-12 NOTE — Assessment & Plan Note (Addendum)
This is likely secondary to sickle cell trait. Normal iron level.

## 2023-05-12 NOTE — Progress Notes (Signed)
Pt here to establish care.

## 2023-05-12 NOTE — Progress Notes (Signed)
Hematology/Oncology Consult note [phone] [phone]        REFERRING PROVIDER: Armando Gang, FNP   CHIEF COMPLAINTS/REASON FOR VISIT:  Evaluation of low WBC and high RBC.   ASSESSMENT & PLAN:   Leukopenia I discussed with patient that the differential diagnosis of leukopenia is broad, including acute or chronic infection, inflammation, nutrition deficiency, autoimmune disease,  ethnic, or malignant etiology including underlying bone morrow disorders.  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  Rule out other etiologies. For the work up of patient's leukoepenia, I recommend checking CBC;CMP, LDH; smear review, folate, Vitamin B12, hepatitis, HIV, flowcytometry and monoclonal gammopathy workup.    Microcytosis This is likely secondary to sickle cell trait. Normal iron level.   Orders Placed This Encounter  Procedures   Vitamin B12    Standing Status:   Future    Number of Occurrences:   1    Standing Expiration Date:   05/11/2024   CBC with Differential/Platelet    Standing Status:   Future    Number of Occurrences:   1    Standing Expiration Date:   05/11/2024   Flow cytometry panel-leukemia/lymphoma work-up    Standing Status:   Future    Number of Occurrences:   1    Standing Expiration Date:   05/11/2024   Protein electrophoresis, serum    Standing Status:   Future    Number of Occurrences:   1    Standing Expiration Date:   05/11/2024   Folate    Standing Status:   Future    Number of Occurrences:   1    Standing Expiration Date:   05/11/2024   Lactate dehydrogenase    Standing Status:   Future    Number of Occurrences:   1    Standing Expiration Date:   05/11/2024   Hepatitis panel, acute    Standing Status:   Future    Number of Occurrences:   1    Standing Expiration Date:   05/11/2024   Technologist smear review    Standing Status:   Future    Number of Occurrences:   1    Standing Expiration Date:   05/11/2024    Order Specific Question:    Clinical information:    Answer:   leukpenia.   Ferritin    Standing Status:   Future    Number of Occurrences:   1    Standing Expiration Date:   11/10/2023   Iron and TIBC    Standing Status:   Future    Number of Occurrences:   1    Standing Expiration Date:   05/11/2024   Ambulatory referral to Social Work    Referral Priority:   Routine    Referral Type:   Consultation    Referral Reason:   Specialty Services Required    Number of Visits Requested:   1   Ambulatory referral to Genetics    Referral Priority:   Routine    Referral Type:   Consultation    Referral Reason:   Specialty Services Required    Number of Visits Requested:   1   Follow-up in a few weeks to discuss results. All questions were answered. The patient knows to call the clinic with any problems, questions or concerns.  Rickard Patience, MD, PhD [facility] Hematology Oncology 05/12/2023   HISTORY OF PRESENTING ILLNESS:   Pam Snyder is a  57 y.o.  female with PMH listed below was seen in consultation  at the request of  Armando Gang, FNP  for evaluation of Low WBC and high RBC Recent blood work on 04/19/2023 showed WBC 3.8, normal hemoglobin 12.  RBC 5.09.  MCV 73.3.  The patient has a known history of anemia and carries the sickle cell trait, which is also present in their daughter and brother. The patient experiences constant fatigue and has difficulty sleeping at night. They also have a diagnosis of diabetes, with a recent A1c of 9  The patient has a significant family history of cancer, including colon and breast cancer, and lung disease.  She has not had a colonoscopy due to previous issues with bowel movements and the preparatory process.  patient has had mamm[facility] at [facility].  Patient denies any unintentional weight loss, night sweats or fever.   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   Stroke (HCC)    15 years ago, no deficits   Vertigo     SURGICAL HISTORY: Past Surgical History:  Procedure Laterality Date   CARDIAC CATHETERIZATION     x3 last in 2014, device implanted to help MVP   ENDOMETRIAL ABLATION  2014   [facility]   EPIGASTRIC HERNIA REPAIR  01/04/2017   Procedure: HERNIA REPAIR;  Surgeon: Leafy Ro, MD;  Location: [facility] ORS;  Service: General;;   fibroid cyst     LAPAROTOMY N/A 01/04/2017   Procedure: EXPLORATORY LAPAROTOMY;  Surgeon: Leafy Ro, MD;  Location: [facility] ORS;  Service: General;  Laterality: N/A;   LYSIS OF ADHESION  01/04/2017   Procedure: LYSIS OF ADHESION;  Surgeon: Leafy Ro, MD;  Location: [facility] ORS;  Service: General;;    SOCIAL HISTORY: Social History   Socioeconomic History   Marital status: Divorced    Spouse name: Not on file   Number of children: Not on file   Years of education: Not on file   Highest education level: Not on file  Occupational History   Not on file  Tobacco Use   Smoking status: Never   Smokeless tobacco: Never  Vaping Use   Vaping status: Never Used  Substance and Sexual Activity   Alcohol use: No   Drug use: No   Sexual activity: Not Currently  Other Topics Concern   Not on file  Social History Narrative   Not on file   Social Determinants of Health   Financial Resource Strain: Not on file  Food Insecurity: Food Insecurity Present (05/12/2023)   Hunger Vital Sign    Worried About Running Out of Food in the Last Year: Often true    Ran Out of Food in the Last Year: Often  true  Transportation Needs: No Transportation Needs (05/12/2023)   PRAPARE - Administrator, Civil Service (Medical): No    Lack of Transportation (Non-Medical): No  Physical Activity: Not  on file  Stress: Not on file  Social Connections: Not on file  Intimate Partner Violence: Not At Risk (05/12/2023)   Humiliation, Afraid, Rape, and Kick questionnaire    Fear of Current or Ex-Partner: No    Emotionally Abused: No    Physically Abused: No    Sexually Abused: No    FAMILY HISTORY: Family History  Problem Relation Age of Onset   Lung cancer Mother    Leukemia Father    Sickle cell anemia Brother    Breast cancer Maternal Aunt 50   Breast cancer Maternal Aunt 50   Colon cancer Maternal Grandmother    Sickle cell anemia Daughter     ALLERGIES:  is allergic to no known allergies.  MEDICATIONS:  Current Outpatient Medications  Medication Sig Dispense Refill   [employer] ASPIRIN ADULT LOW DOSE 81 MG chewable tablet Chew 81 mg by mouth daily.     fluticasone (FLONASE) 50 MCG/ACT nasal spray Place 2 sprays into both nostrils daily.     gabapentin (NEURONTIN) 100 MG capsule Take 1 capsule (100 mg total) by mouth 3 (three) times daily. 90 capsule 2   hydrochlorothiazide (HYDRODIURIL) 25 MG tablet Take 25 mg by mouth daily.     JARDIANCE 25 MG TABS tablet Take 25 mg by mouth daily.     ketorolac (TORADOL) 10 MG tablet Take 10 mg by mouth every 8 (eight) hours as needed.     lisinopril (ZESTRIL) 2.5 MG tablet Take 2.5 mg by mouth daily.     metFORMIN (GLUCOPHAGE) 500 MG tablet Take 500 mg by mouth 3 (three) times daily.     polyethylene glycol powder (GLYCOLAX/MIRALAX) 17 GM/SCOOP powder 2 cap fulls in a full glass of water, three times a day, for 5 days. 255 g 0   pravastatin (PRAVACHOL) 20 MG tablet Take 1 tablet (20 mg total) by mouth daily. 30 tablet 5   albuterol (VENTOLIN HFA) 108 (90 Base) MCG/ACT inhaler Inhale 2 puffs into the lungs every 6 (six) hours as needed for wheezing  or shortness of breath. (Patient not taking: Reported on 02/06/2022) 8 g 2   benzonatate (TESSALON PERLES) 100 MG capsule Take 1 capsule (100 mg total) by mouth 3 (three) times daily as needed for cough. (Patient not taking: Reported on 02/06/2022) 10 capsule 0   loratadine (CLARITIN) 10 MG tablet Take 10 mg by mouth daily. (Patient not taking: Reported on 05/12/2023)     metoprolol tartrate (LOPRESSOR) 100 MG tablet Take 1 tablet (100 mg total) by mouth once for 1 dose. Take 2 hours prior to your CT scan. (Patient not taking: Reported on 05/12/2023) 1 tablet 0   promethazine-dextromethorphan (PROMETHAZINE-DM) 6.25-15 MG/5ML syrup Take 5 mLs by mouth 4 (four) times daily as needed. (Patient not taking: Reported on 05/12/2023) 118 mL 0   No current facility-administered medications for this visit.    Review of Systems  Constitutional:  Positive for fatigue. Negative for appetite change, chills and fever.  HENT:   Negative for hearing loss and voice change.   Eyes:  Negative for eye problems.  Respiratory:  Negative for chest tightness and cough.   Cardiovascular:  Negative for chest pain.  Gastrointestinal:  Negative for abdominal distention, abdominal pain and blood in stool.  Endocrine: Negative for hot flashes.  Genitourinary:  Negative for difficulty urinating and frequency.   Musculoskeletal:  Negative for arthralgias.  Skin:  Negative for itching and rash.  Neurological:  Negative for extremity weakness.  Hematological:  Negative for  adenopathy.  Psychiatric/Behavioral:  Negative for confusion.    PHYSICAL EXAMINATION: ECOG PERFORMANCE STATUS: 1 - Symptomatic but completely ambulatory Vitals:   05/12/23 1056  BP: 134/78  Pulse: 88  Resp: 18  Temp: 97.6 F (36.4 C)   Filed Weights   05/12/23 1056  Weight: 200 lb (90.7 kg)    Physical Exam Constitutional:      General: She is not in acute distress. HENT:     Head: Normocephalic and atraumatic.  Eyes:     General: No scleral  icterus. Cardiovascular:     Rate and Rhythm: Normal rate and regular rhythm.     Heart sounds: Normal heart sounds.  Pulmonary:     Effort: Pulmonary effort is normal. No respiratory distress.  Abdominal:     General: Bowel sounds are normal. There is no distension.     Palpations: Abdomen is soft.  Musculoskeletal:        General: No deformity. Normal range of motion.     Cervical back: Normal range of motion and neck supple.  Skin:    General: Skin is warm and dry.     Findings: No erythema or rash.  Neurological:     Mental Status: She is alert and oriented to person, place, and time. Mental status is at baseline.     Cranial Nerves: No cranial nerve deficit.  Psychiatric:        Mood and Affect: Mood normal.     LABORATORY DATA:  I have reviewed the data as listed    Latest Ref Rng & Units 05/12/2023   11:14 AM 04/19/2023    3:17 PM 10/09/2022   10:37 PM  CBC  WBC 4.0 - 10.5 K/uL 4.7  3.8  3.4   Hemoglobin 12.0 - 15.0 g/dL 14.7  82.9  56.2   Hematocrit 36.0 - 46.0 % 38.6  37.3  37.5   Platelets 150 - 400 K/uL 264  235  230       Latest Ref Rng & Units 04/19/2023    3:17 PM 10/09/2022   10:37 PM 05/24/2022    9:00 PM  CMP  Glucose 70 - 99 mg/dL 130  865  784   BUN 6 - 20 mg/dL 11  10  12    Creatinine 0.44 - 1.00 mg/dL 6.96  2.95  2.84   Sodium 135 - 145 mmol/L 135  139  139   Potassium 3.5 - 5.1 mmol/L 3.9  3.8  3.8   Chloride 98 - 111 mmol/L 104  104  105   CO2 22 - 32 mmol/L 25  25  26    Calcium 8.9 - 10.3 mg/dL 9.0  9.1  9.3   Total Protein 6.5 - 8.1 g/dL  6.7    Total Bilirubin 0.3 - 1.2 mg/dL  0.6    Alkaline Phos 38 - 126 U/L  90    AST 15 - 41 U/L  17    ALT 0 - 44 U/L  21        RADIOGRAPHIC STUDIES: I have personally reviewed the radiological images as listed and agreed with the findings in the report. No results found.

## 2023-05-12 NOTE — Assessment & Plan Note (Signed)
I discussed with patient that the differential diagnosis of leukopenia is broad, including acute or chronic infection, inflammation, nutrition deficiency, autoimmune disease,  ethnic, or malignant etiology including underlying bone morrow disorders.  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  Rule out other etiologies. For the work up of patient's leukoepenia, I recommend checking CBC;CMP, LDH; smear review, folate, Vitamin B12, hepatitis, HIV, flowcytometry and monoclonal gammopathy workup.

## 2023-05-13 ENCOUNTER — Telehealth: Payer: Self-pay

## 2023-05-13 NOTE — Telephone Encounter (Signed)
Clinical Social Work was referred by medical provider for assessment of psychosocial needs.  CSW attempted to contact patient by phone.  Left voicemail with contact information and request for return call.

## 2023-05-14 ENCOUNTER — Inpatient Hospital Stay: Payer: Medicaid Other

## 2023-05-14 ENCOUNTER — Telehealth: Payer: Self-pay

## 2023-05-14 NOTE — Progress Notes (Signed)
[facility] Clinical Social Work  Clinical Social Work was referred by medical provider for assessment of psychosocial needs.  Clinical Social Worker contacted patient by phone to offer support and assess for needs.  Patient expressed financial strain.  Explored resources available, which she is familiar with.  Patient is not eligible for cancer center grant.  Patient stated she understood.     Dorothey Baseman, LCSW  Clinical Social Worker [facility]        Patient is participating in a Managed Medicaid Plan:  Yes

## 2023-05-14 NOTE — Telephone Encounter (Signed)
Clinical Social Work was referred by medical provider for assessment of psychosocial needs.  CSW attempted to contact patient by phone a second time.  Left voicemail with contact information and request for return call.

## 2023-05-15 LAB — COMP PANEL: LEUKEMIA/LYMPHOMA

## 2023-05-17 LAB — PROTEIN ELECTROPHORESIS, SERUM
A/G Ratio: 1.3 (ref 0.7–1.7)
Albumin ELP: 4.2 g/dL (ref 2.9–4.4)
Alpha-1-Globulin: 0.2 g/dL (ref 0.0–0.4)
Alpha-2-Globulin: 0.7 g/dL (ref 0.4–1.0)
Beta Globulin: 1.4 g/dL — ABNORMAL HIGH (ref 0.7–1.3)
Gamma Globulin: 1 g/dL (ref 0.4–1.8)
Globulin, Total: 3.2 g/dL (ref 2.2–3.9)
Total Protein ELP: 7.4 g/dL (ref 6.0–8.5)

## 2023-05-19 ENCOUNTER — Encounter: Payer: Self-pay | Admitting: Licensed Clinical Social Worker

## 2023-05-19 ENCOUNTER — Inpatient Hospital Stay: Payer: Medicaid Other

## 2023-05-19 ENCOUNTER — Inpatient Hospital Stay (HOSPITAL_BASED_OUTPATIENT_CLINIC_OR_DEPARTMENT_OTHER): Payer: Medicaid Other | Admitting: Licensed Clinical Social Worker

## 2023-05-19 DIAGNOSIS — Z8049 Family history of malignant neoplasm of other genital organs: Secondary | ICD-10-CM

## 2023-05-19 DIAGNOSIS — Z806 Family history of leukemia: Secondary | ICD-10-CM

## 2023-05-19 DIAGNOSIS — Z800 Family history of malignant neoplasm of digestive organs: Secondary | ICD-10-CM

## 2023-05-19 DIAGNOSIS — Z803 Family history of malignant neoplasm of breast: Secondary | ICD-10-CM

## 2023-05-19 NOTE — Progress Notes (Signed)
REFERRING PROVIDER: Rickard Patience, MD [address] [place],  [place] [address]  PRIMARY PROVIDER:  Armando Gang, FNP  PRIMARY REASON FOR VISIT:  1. Family history of breast cancer   2. Family history of uterine cancer   3. Family history of colon cancer   4. Family history of leukemia      HISTORY OF PRESENT ILLNESS:   Pam Snyder, a 57 y.o. female, was seen for a [facility] cancer genetics consultation at the request of Dr. Cathie Hoops due to a family history of cancer.  Pam Snyder presents to clinic today to discuss the possibility of a hereditary predisposition to cancer, genetic testing, and to further clarify her future cancer risks, as well as potential cancer risks for family members.   CANCER HISTORY:  Pam Snyder is a 57 y.o. female with no personal history of cancer.    RISK FACTORS:  Menarche was at age 89.  First live birth at age 26.  Ovaries intact: yes.  Hysterectomy: no.  Menopausal status: postmenopausal.  Colonoscopy: no; not examined. Mammogram within the last year: yes.  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   Stroke (HCC)    15 years ago, no deficits   Vertigo     Past Surgical History:  Procedure Laterality Date   CARDIAC CATHETERIZATION     x3 last in 2014, device implanted to help MVP   ENDOMETRIAL ABLATION  2014   [facility]   EPIGASTRIC HERNIA REPAIR  01/04/2017   Procedure: HERNIA REPAIR;  Surgeon: Leafy Ro, MD;  Location: [facility] ORS;  Service: General;;    fibroid cyst     LAPAROTOMY N/A 01/04/2017   Procedure: EXPLORATORY LAPAROTOMY;  Surgeon: Leafy Ro, MD;  Location: [facility] ORS;  Service: General;  Laterality: N/A;   LYSIS OF ADHESION  01/04/2017   Procedure: LYSIS OF ADHESION;  Surgeon: Leafy Ro, MD;  Location: [facility] ORS;  Service: General;;    FAMILY HISTORY:  We obtained a detailed, 4-generation family history.  Significant diagnoses are listed below: Family History  Problem Relation Age of Onset   Lung cancer Mother        dx 44s d. 61s   Leukemia Father        dx 84s d. 44s   Sickle cell anemia Brother    Breast cancer Maternal Aunt 50   Breast cancer Maternal Aunt 50   Colon cancer Maternal Grandmother        dx 35s d 9s   Emphysema Maternal Grandfather    Other Paternal Grandmother        nasal tumor   Sickle cell anemia Daughter    Uterine cancer Cousin        dx 90s   Uterine cancer Cousin    Pam Snyder has 3 daughters and 2 sons. She had 2 brothers and 1 sister, a  brother passed of sickle cell anemia.   Pam Snyder mother had lung cancer in her 77s and passed from this in her 54s. Two maternal aunts had breast cancer at 51. A maternal cousin had uterine cancer in her 47s and passed of this. Another cousin had uterine cancer as well. Maternal grandmother had colon cancer in her 38s and passed from it.   Pam Snyder father had leukemia in his 38s and passed in his 13s. Paternal grandmother had a nasal tumor and passed of this over age 24.  Pam Snyder is unaware of previous family history of genetic testing for hereditary cancer risks. There is no reported Ashkenazi Jewish ancestry. There is no known consanguinity.    GENETIC COUNSELING ASSESSMENT: [name] is a 57 y.o. female with a family history of breast, uterine and colon cancer which is somewhat suggestive of a hereditary cancer syndrome and predisposition to cancer. We, therefore, discussed and recommended the following at today's visit.   DISCUSSION:  We discussed that approximately 10% of breast cancer is hereditary. Most cases of hereditary breast cancer are associated with BRCA1/BRCA2 genes, although there are other genes associated with hereditary cancer as well including Lynch syndrome genes which can increase risk for uterine and colon cancer. Cancers and risks are gene specific. We discussed that testing is beneficial for several reasons including knowing about cancer risks, identifying potential screening and risk-reduction options that may be appropriate, and to understand if other family members could be at risk for cancer and allow them to undergo genetic testing.   We reviewed the characteristics, features and inheritance patterns of hereditary cancer syndromes. We also discussed genetic testing, including the appropriate family members to test, the process of testing, insurance coverage and turn-around-time for results. We discussed the implications of a negative, positive and/or variant of uncertain significant result. We recommended Pam Snyder pursue genetic testing for the [facility] Multi-Cancer+RNA gene panel.   Based on Pam Snyder family history of cancer, she meets medical criteria for genetic testing. Though Pam Snyder is not personally affected, there are no affected family members that are willing/able to undergo hereditary cancer testing.  Therefore, Pam Snyder the most informative family member available. Despite that she meets criteria, she may still have an out of pocket cost.   PLAN: After considering the risks, benefits, and limitations, Pam Snyder provided informed consent to pursue genetic testing and the blood sample was sent to [facility] for analysis of the Multi-Cancer+RNA panel. Results should be available within approximately 2-3 weeks' time, at which point they will be disclosed by telephone to Pam Snyder, as will any additional recommendations warranted by these results. Pam Snyder will receive a summary of  her genetic counseling visit and a copy of her results once available. This information will also be available in Epic.   Pam Snyder questions were answered to her satisfaction today. Our contact information was provided should additional questions or concerns arise. Thank you for the referral and allowing [place] to share in the care of your patient.   Lacy Duverney, MS, [facility] Genetic Counselor [email or web address] Phone: [phone]  The patient was seen for a total of 25 minutes in face-to-face genetic counseling.  Dr. Blake Divine was available for discussion regarding this case.   _______________________________________________________________________ For Office Staff:  Number of people involved in session: 1 Was an Intern/ student involved with case: no

## 2023-05-27 ENCOUNTER — Ambulatory Visit: Payer: Self-pay | Admitting: Licensed Clinical Social Worker

## 2023-05-27 ENCOUNTER — Encounter: Payer: Self-pay | Admitting: Licensed Clinical Social Worker

## 2023-05-27 ENCOUNTER — Telehealth: Payer: Self-pay | Admitting: Licensed Clinical Social Worker

## 2023-05-27 DIAGNOSIS — Z1379 Encounter for other screening for genetic and chromosomal anomalies: Secondary | ICD-10-CM

## 2023-05-27 NOTE — Progress Notes (Signed)
HPI:   Ms. Shanahan was previously seen in the [facility] due to a family history of cancer and concerns regarding a hereditary predisposition to cancer. Please refer to our prior cancer genetics clinic note for more information regarding our discussion, assessment and recommendations, at the time. Ms. Pata recent genetic test results were disclosed to her, as were recommendations warranted by these results. These results and recommendations are discussed in more detail below.  CANCER HISTORY:  Oncology History   No history exists.    FAMILY HISTORY:  We obtained a detailed, 4-generation family history.  Significant diagnoses are listed below: Family History  Problem Relation Age of Onset   Lung cancer Mother        dx 62s d. 72s   Leukemia Father        dx 75s d. 47s   Sickle cell anemia Brother    Breast cancer Maternal Aunt 50   Breast cancer Maternal Aunt 50   Colon cancer Maternal Grandmother        dx 19s d 21s   Emphysema Maternal Grandfather    Other Paternal Grandmother        nasal tumor   Sickle cell anemia Daughter    Uterine cancer Cousin        dx 39s   Uterine cancer Cousin     Ms. Olivares has 3 daughters and 2 sons. She had 2 brothers and 1 sister, a brother passed of sickle cell anemia.    Ms. Bresnan mother had lung cancer in her 21s and passed from this in her 55s. Two maternal aunts had breast cancer at 25. A maternal cousin had uterine cancer in her 8s and passed of this. Another cousin had uterine cancer as well. Maternal grandmother had colon cancer in her 60s and passed from it.    Ms. Marzullo father had leukemia in his 57s and passed in his 51s. Paternal grandmother had a nasal tumor and passed of this over age 35.   Ms. Suffern is unaware of previous family history of genetic testing for hereditary cancer risks. There is no reported Ashkenazi Jewish ancestry. There is no known consanguinity.    GENETIC TEST RESULTS:  The  [facility] Multi-Cancer+RNA Panel found no pathogenic mutations.   The Multi-Cancer + RNA Panel offered by [facility] includes sequencing and/or deletion/duplication analysis of the following 70 genes:  AIP*, ALK, APC*, ATM*, AXIN2*, BAP1*, BARD1*, BLM*, BMPR1A*, BRCA1*, BRCA2*, BRIP1*, CDC73*, CDH1*, CDK4, CDKN1B*, CDKN2A, CHEK2*, CTNNA1*, DICER1*, EPCAM, EGFR, FH*, FLCN*, GREM1, HOXB13, KIT, LZTR1, MAX*, MBD4, MEN1*, MET, MITF, MLH1*, MSH2*, MSH3*, MSH6*, MUTYH*, NF1*, NF2*, NTHL1*, PALB2*, PDGFRA, PMS2*, POLD1*, POLE*, POT1*, PRKAR1A*, PTCH1*, PTEN*, RAD51C*, RAD51D*, RB1*, RET, SDHA*, SDHAF2*, SDHB*, SDHC*, SDHD*, SMAD4*, SMARCA4*, SMARCB1*, SMARCE1*, STK11*, SUFU*, TMEM127*, TP53*, TSC1*, TSC2*, VHL*. RNA analysis is performed for * genes.   The test report has been scanned into EPIC and is located under the Molecular Pathology section of the Results Review tab.  A portion of the result report is included below for reference. Genetic testing reported out on 05/26/2023.    Even though a pathogenic variant was not identified, possible explanations for the cancer in the family may include: There may be no hereditary risk for cancer in the family. The cancers in Ms. Friedt and/or her family may be sporadic/familial or due to other genetic and environmental factors. There may be a gene mutation in one of these genes that current testing methods cannot detect but that chance  is small. There could be another gene that has not yet been discovered, or that we have not yet tested, that is responsible for the cancer diagnoses in the family.  It is also possible there is a hereditary cause for the cancer in the family that Ms. Krichbaum did not inherit.  Therefore, it is important to remain in touch with cancer genetics in the future so that we can continue to offer Ms. Wolverton the most up to date genetic testing.   ADDITIONAL GENETIC TESTING:  We discussed with Ms. Fiorito that her genetic testing was fairly  extensive.  If there are additional relevant genes identified to increase cancer risk that can be analyzed in the future, we would be happy to discuss and coordinate this testing at that time.    CANCER SCREENING RECOMMENDATIONS:  Ms. Messineo test result is considered negative (normal).  This means that we have not identified a hereditary cause for her family history of cancer at this time.   An individual's cancer risk and medical management are not determined by genetic test results alone. Overall cancer risk assessment incorporates additional factors, including personal medical history, family history, and any available genetic information that may result in a personalized plan for cancer prevention and surveillance. Therefore, it is recommended she continue to follow the cancer management and screening guidelines provided by her primary healthcare provider.  Based on the reported personal and family history, specific cancer screenings for Ms. Hazeline Junker and her family include:  Breast Cancer Screening:  The Tyrer-Cuzick model is one of multiple prediction models developed to estimate an individual's lifetime risk of developing breast cancer. The Tyrer-Cuzick model is endorsed by the [employer] ([employer]CCN). This model includes many risk factors such as family history, endogenous estrogen exposure, and benign breast disease. The calculation is highly-dependent on the accuracy of clinical data provided by the patient and can change over time. The Tyrer-Cuzick model may be repeated to reflect new information in her personal or family history in the future.    Ms. Bayless'sTyrer-Cuzick risk score is 10.5%.  She is encouraged to continue to be mindful of her family history and be diligent with general population breast screening, including annual mammograms beginning 10 years prior to the youngest diagnosis in her family or by age 57. She is encouraged to contact us regarding  any changes to her personal or family history, as her recommendations for screening would be altered significantly if her lifetime risk is determined to be greater than 20% based on updated information.       RECOMMENDATIONS FOR FAMILY MEMBERS:   Since she did not inherit a identifiable mutation in a cancer predisposition gene included on this panel, her children could not have inherited a known mutation from her in one of these genes. Individuals in this family might be at some increased risk of developing cancer, over the general population risk, due to the family history of cancer.  Individuals in the family should notify their providers of the family history of cancer. We recommend women in this family have a yearly mammogram beginning at age 67, or 59 years younger than the earliest onset of cancer, an annual clinical breast exam, and perform monthly breast self-exams.  Family members should have colonoscopies by at age 27, or earlier, as recommended by their providers. Other members of the family may still carry a pathogenic variant in one of these genes that Ms. Safarian did not inherit. Based on the family history, we  recommend her maternal relatives, especially those who have had cancer, have genetic counseling and testing. Ms. Filipowicz will let us know if we can be of any assistance in coordinating genetic counseling and/or testing for this family member.     FOLLOW-UP:  Lastly, we discussed with Ms. Torson that cancer genetics is a rapidly advancing field and it is possible that new genetic tests will be appropriate for her and/or her family members in the future. We encouraged her to remain in contact with cancer genetics on an annual basis so we can update her personal and family histories and let her know of advances in cancer genetics that may benefit this family.   Our contact number was provided. Ms. Orfield questions were answered to her satisfaction, and she knows she is welcome to  call us at anytime with additional questions or concerns.    Lacy Duverney, MS, [facility] Genetic Counselor [email or web address] Phone: [phone]

## 2023-05-27 NOTE — Telephone Encounter (Signed)
I contacted Ms. Orban to discuss her genetic testing results. No pathogenic variants were identified in the 70 genes analyzed. Detailed clinic note to follow.   The test report has been scanned into EPIC and is located under the Molecular Pathology section of the Results Review tab.  A portion of the result report is included below for reference.      Lacy Duverney, MS, [facility] Genetic Counselor [email or web address] Phone: [phone]

## 2023-06-10 NOTE — Assessment & Plan Note (Deleted)
 I discussed with patient that the differential diagnosis of leukopenia is broad, including acute or chronic infection, inflammation, nutrition deficiency, autoimmune disease,  ethnic, or malignant etiology including underlying bone morrow disorders.  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  Rule out other etiologies. For the work up of patient's leukoepenia, I recommend checking CBC;CMP, LDH; smear review, folate, Vitamin B12, hepatitis, HIV, flowcytometry and monoclonal gammopathy workup.

## 2023-06-11 ENCOUNTER — Inpatient Hospital Stay: Payer: Medicaid Other | Admitting: Oncology

## 2023-06-11 DIAGNOSIS — D72819 Decreased white blood cell count, unspecified: Secondary | ICD-10-CM

## 2023-06-15 ENCOUNTER — Inpatient Hospital Stay: Payer: Medicaid Other | Attending: Oncology | Admitting: Oncology

## 2023-06-15 ENCOUNTER — Encounter: Payer: Self-pay | Admitting: Oncology

## 2023-07-04 ENCOUNTER — Other Ambulatory Visit: Payer: Self-pay

## 2023-07-04 DIAGNOSIS — R1907 Generalized intra-abdominal and pelvic swelling, mass and lump: Secondary | ICD-10-CM | POA: Diagnosis not present

## 2023-07-04 DIAGNOSIS — M545 Low back pain, unspecified: Secondary | ICD-10-CM | POA: Diagnosis present

## 2023-07-04 DIAGNOSIS — S39012A Strain of muscle, fascia and tendon of lower back, initial encounter: Secondary | ICD-10-CM | POA: Diagnosis not present

## 2023-07-04 DIAGNOSIS — Z5321 Procedure and treatment not carried out due to patient leaving prior to being seen by health care provider: Secondary | ICD-10-CM | POA: Diagnosis not present

## 2023-07-04 DIAGNOSIS — X58XXXA Exposure to other specified factors, initial encounter: Secondary | ICD-10-CM | POA: Diagnosis not present

## 2023-07-04 LAB — COMPREHENSIVE METABOLIC PANEL
ALT: 20 U/L (ref 0–44)
AST: 18 U/L (ref 15–41)
Albumin: 4 g/dL (ref 3.5–5.0)
Alkaline Phosphatase: 83 U/L (ref 38–126)
Anion gap: 11 (ref 5–15)
BUN: 16 mg/dL (ref 6–20)
CO2: 24 mmol/L (ref 22–32)
Calcium: 9.1 mg/dL (ref 8.9–10.3)
Chloride: 105 mmol/L (ref 98–111)
Creatinine, Ser: 0.96 mg/dL (ref 0.44–1.00)
GFR, Estimated: >60 mL/min (ref >60)
Glucose, Bld: 100 mg/dL — ABNORMAL HIGH (ref 70–99)
Potassium: 3.8 mmol/L (ref 3.5–5.1)
Sodium: 140 mmol/L (ref 135–145)
Total Bilirubin: 0.3 mg/dL (ref 0.0–1.2)
Total Protein: 7 g/dL (ref 6.5–8.1)

## 2023-07-04 LAB — CBC
HCT: 39.9 % (ref 36.0–46.0)
Hemoglobin: 12.5 g/dL (ref 12.0–15.0)
MCH: 24 pg — ABNORMAL LOW (ref 26.0–34.0)
MCHC: 31.3 g/dL (ref 30.0–36.0)
MCV: 76.7 fL — ABNORMAL LOW (ref 80.0–100.0)
Platelets: 237 10*3/uL (ref 150–400)
RBC: 5.2 MIL/uL — ABNORMAL HIGH (ref 3.87–5.11)
RDW: 14.9 % (ref 11.5–15.5)
WBC: 3.5 10*3/uL — ABNORMAL LOW (ref 4.0–10.5)
nRBC: 0 % (ref 0.0–0.2)

## 2023-07-04 LAB — LIPASE, BLOOD: Lipase: 44 U/L (ref 11–51)

## 2023-07-04 LAB — TROPONIN I (HIGH SENSITIVITY): Troponin I (High Sensitivity): 4 ng/L (ref <18)

## 2023-07-04 NOTE — ED Triage Notes (Addendum)
Pt reports she has not had a BM in 3 weeks unrelieved with Miralax and other OTC meds. She reports lower back pain and abdominal swelling. Denies abdominal pain.  Pt now reports she had an episode of chest pain upon arrival to the ER. Denies chest pain at this time.

## 2023-07-05 ENCOUNTER — Emergency Department
Admission: EM | Admit: 2023-07-05 | Discharge: 2023-07-05 | Discharge status: Against Medical Advice | Payer: Medicaid Other | Attending: Emergency Medicine | Admitting: Emergency Medicine

## 2023-07-05 ENCOUNTER — Emergency Department: Payer: Medicaid Other

## 2023-07-05 ENCOUNTER — Encounter: Payer: Self-pay | Admitting: Emergency Medicine

## 2023-07-05 ENCOUNTER — Emergency Department
Admission: EM | Admit: 2023-07-05 | Discharge: 2023-07-05 | Disposition: A | Discharge status: Home / Self Care | Payer: Medicaid Other | Source: Home / Self Care | Attending: Emergency Medicine | Admitting: Emergency Medicine

## 2023-07-05 DIAGNOSIS — S39012A Strain of muscle, fascia and tendon of lower back, initial encounter: Secondary | ICD-10-CM | POA: Insufficient documentation

## 2023-07-05 DIAGNOSIS — X58XXXA Exposure to other specified factors, initial encounter: Secondary | ICD-10-CM | POA: Insufficient documentation

## 2023-07-05 LAB — URINALYSIS, ROUTINE W REFLEX MICROSCOPIC
Bacteria, UA: NONE SEEN
Bilirubin Urine: NEGATIVE
Glucose, UA: >=500 mg/dL — AB
Hgb urine dipstick: NEGATIVE
Ketones, ur: NEGATIVE mg/dL
Leukocytes,Ua: NEGATIVE
Nitrite: NEGATIVE
Protein, ur: NEGATIVE mg/dL
RBC / HPF: 0 RBC/hpf (ref 0–5)
Specific Gravity, Urine: 1.02 (ref 1.005–1.030)
Squamous Epithelial / HPF: 0 /[HPF] (ref 0–5)
pH: 5 (ref 5.0–8.0)

## 2023-07-05 LAB — CBC
HCT: 38 % (ref 36.0–46.0)
Hemoglobin: 12.5 g/dL (ref 12.0–15.0)
MCH: 24.2 pg — ABNORMAL LOW (ref 26.0–34.0)
MCHC: 32.9 g/dL (ref 30.0–36.0)
MCV: 73.6 fL — ABNORMAL LOW (ref 80.0–100.0)
Platelets: 222 10*3/uL (ref 150–400)
RBC: 5.16 MIL/uL — ABNORMAL HIGH (ref 3.87–5.11)
RDW: 14.8 % (ref 11.5–15.5)
WBC: 3.5 10*3/uL — ABNORMAL LOW (ref 4.0–10.5)
nRBC: 0 % (ref 0.0–0.2)

## 2023-07-05 LAB — BASIC METABOLIC PANEL
Anion gap: 8 (ref 5–15)
BUN: 15 mg/dL (ref 6–20)
CO2: 25 mmol/L (ref 22–32)
Calcium: 9.2 mg/dL (ref 8.9–10.3)
Chloride: 105 mmol/L (ref 98–111)
Creatinine, Ser: 0.78 mg/dL (ref 0.44–1.00)
GFR, Estimated: >60 mL/min (ref >60)
Glucose, Bld: 100 mg/dL — ABNORMAL HIGH (ref 70–99)
Potassium: 3.9 mmol/L (ref 3.5–5.1)
Sodium: 138 mmol/L (ref 135–145)

## 2023-07-05 LAB — TROPONIN I (HIGH SENSITIVITY): Troponin I (High Sensitivity): 4 ng/L (ref <18)

## 2023-07-05 MED ORDER — TRAMADOL HCL 50 MG PO TABS: 50.0000 mg | ORAL_TABLET | Freq: Four times a day (QID) | ORAL | 0 refills | Status: AC | PRN

## 2023-07-05 MED ORDER — KETOROLAC TROMETHAMINE 30 MG/ML IJ SOLN
30.0000 mg | Freq: Once | INTRAMUSCULAR | Status: AC
  Administered 2023-07-05: 30 mg via INTRAMUSCULAR
  Filled 2023-07-05: qty 1

## 2023-07-05 NOTE — ED Notes (Signed)
First troponin not collected until 1153, second trop not due yet

## 2023-07-05 NOTE — ED Triage Notes (Signed)
Pt to ED via POV. Pt states that she is having lower back pain and cough. Pt states that when she coughs she has pain under her left breast. Pt was in the ED last night but before being seen. Pt is in NAD.

## 2023-07-05 NOTE — ED Notes (Signed)
No answer x2

## 2023-07-05 NOTE — ED Notes (Signed)
No answer x1

## 2023-07-05 NOTE — ED Notes (Signed)
Lab called to collect

## 2023-07-05 NOTE — ED Provider Notes (Signed)
[facility] Provider Note    Event Date/Time   First MD Initiated Contact with Patient 07/05/23 1511     (approximate)   History   Cough and Back Pain   HPI  Pam Snyder is a 58 y.o. female who reports she has had a cough for several days, now has some discomfort in her right lower back and thinks she may have pulled a muscle.  No fevers or chills.  No shortness of breath.  No calf pain or swelling     Physical Exam   Triage Vital Signs: ED Triage Vitals  Encounter Vitals Group     BP 07/05/23 1107 122/76     Systolic BP Percentile --      Diastolic BP Percentile --      Pulse Rate 07/05/23 1107 71     Resp 07/05/23 1107 16     Temp 07/05/23 1107 98.7 F (37.1 C)     Temp Source 07/05/23 1107 Oral     SpO2 07/05/23 1107 94 %     Weight 07/05/23 1105 90 kg (198 lb 6.6 oz)     Height 07/05/23 1105 1.727 m (5\' 8" )     Head Circumference --      Peak Flow --      Pain Score 07/05/23 1104 10     Pain Loc --      Pain Education --      Exclude from Growth Chart --     Most recent vital signs: Vitals:   07/05/23 1107 07/05/23 1520  BP: 122/76 132/71  Pulse: 71 67  Resp: 16 16  Temp: 98.7 F (37.1 C) (!) 97.5 F (36.4 C)  SpO2: 94% 99%     General: Awake, no distress.  CV:  Good peripheral perfusion.  Resp:  Normal effort.  Abd:  No distention.  Other:  Back: No vertebral tenderness, right lumbar paraspinal tenderness likely muscle spasm which replicates her pain.   ED Results / Procedures / Treatments   Labs (all labs ordered are listed, but only abnormal results are displayed) Labs Reviewed  BASIC METABOLIC PANEL - Abnormal; Notable for the following components:      Result Value   Glucose, Bld 100 (*)    All other components within normal limits  CBC - Abnormal; Notable for the following components:   WBC 3.5 (*)    RBC 5.16 (*)    MCV 73.6 (*)    MCH 24.2 (*)    All other components within normal limits   URINALYSIS, ROUTINE W REFLEX MICROSCOPIC - Abnormal; Notable for the following components:   Color, Urine STRAW (*)    APPearance CLEAR (*)    Glucose, UA >=500 (*)    All other components within normal limits  TROPONIN I (HIGH SENSITIVITY)  TROPONIN I (HIGH SENSITIVITY)     EKG     RADIOLOGY     PROCEDURES:  Critical Care performed:   Procedures   MEDICATIONS ORDERED IN ED: Medications  ketorolac (TORADOL) 30 MG/ML injection 30 mg (30 mg Intramuscular Given 07/05/23 1540)     IMPRESSION / MDM / ASSESSMENT AND PLAN / ED COURSE  I reviewed the triage vital signs and the nursing notes. Patient's presentation is most consistent with acute illness / injury with system symptoms.  Patient presents with back pain as detailed above, likely musculoskeletal injury possibly from coughing.  Exam is consistent with musculoskeletal back pain.  Lab work is reassuring, urinalysis is normal,  no evidence of pneumonia on chest x-ray.  Will treat with IM Toradol, outpatient analgesics, outpatient follow-up recommended, return precautions discussed        FINAL CLINICAL IMPRESSION(S) / ED DIAGNOSES   Final diagnoses:  Strain of lumbar region, initial encounter     Rx / DC Orders   ED Discharge Orders          Ordered    traMADol (ULTRAM) 50 MG tablet  Every 6 hours PRN        07/05/23 1533             Note:  This document was prepared using Dragon voice recognition software and may include unintentional dictation errors.   Jene Every, MD 07/05/23 2253

## 2023-07-05 NOTE — ED Notes (Signed)
See triage notes. Patient c/o pain in her lower left back and having not had a bowel movement in three weeks.

## 2023-07-16 ENCOUNTER — Other Ambulatory Visit: Payer: Medicaid Other

## 2024-01-12 ENCOUNTER — Encounter: Payer: Self-pay | Admitting: Family Medicine

## 2024-02-15 ENCOUNTER — Inpatient Hospital Stay: Admitting: Oncology

## 2024-02-15 ENCOUNTER — Telehealth: Payer: Self-pay | Admitting: Oncology

## 2024-02-15 NOTE — Telephone Encounter (Signed)
 Pam Snyder left a message with answering service to cancel her appt today. She has been exposed to Covid. Team was notified.

## 2024-02-15 NOTE — Assessment & Plan Note (Deleted)
 I discussed with patient that the differential diagnosis of leukopenia is broad, including acute or chronic infection, inflammation, nutrition deficiency, autoimmune disease,  ethnic, or malignant etiology including underlying bone morrow disorders.  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  Rule out other etiologies. For the work up of patient's leukoepenia, I recommend checking CBC;CMP, LDH; smear review, folate, Vitamin B12, hepatitis, HIV, flowcytometry and monoclonal gammopathy workup.

## 2024-03-14 ENCOUNTER — Ambulatory Visit: Admitting: Oncology

## 2024-05-01 ENCOUNTER — Inpatient Hospital Stay: Attending: Oncology | Admitting: Oncology

## 2024-05-01 ENCOUNTER — Encounter: Payer: Self-pay | Admitting: Oncology

## 2024-05-01 ENCOUNTER — Inpatient Hospital Stay

## 2024-05-01 VITALS — BP 140/66 | HR 75 | Temp 97.6°F | Ht 68.0 in | Wt 190.0 lb

## 2024-05-01 DIAGNOSIS — Z803 Family history of malignant neoplasm of breast: Secondary | ICD-10-CM | POA: Insufficient documentation

## 2024-05-01 DIAGNOSIS — L989 Disorder of the skin and subcutaneous tissue, unspecified: Secondary | ICD-10-CM | POA: Insufficient documentation

## 2024-05-01 DIAGNOSIS — D72819 Decreased white blood cell count, unspecified: Secondary | ICD-10-CM

## 2024-05-01 DIAGNOSIS — Z8049 Family history of malignant neoplasm of other genital organs: Secondary | ICD-10-CM | POA: Insufficient documentation

## 2024-05-01 DIAGNOSIS — D573 Sickle-cell trait: Secondary | ICD-10-CM

## 2024-05-01 DIAGNOSIS — Z800 Family history of malignant neoplasm of digestive organs: Secondary | ICD-10-CM | POA: Insufficient documentation

## 2024-05-01 DIAGNOSIS — R5383 Other fatigue: Secondary | ICD-10-CM | POA: Insufficient documentation

## 2024-05-01 DIAGNOSIS — Z806 Family history of leukemia: Secondary | ICD-10-CM | POA: Insufficient documentation

## 2024-05-01 DIAGNOSIS — M19041 Primary osteoarthritis, right hand: Secondary | ICD-10-CM | POA: Insufficient documentation

## 2024-05-01 DIAGNOSIS — Z801 Family history of malignant neoplasm of trachea, bronchus and lung: Secondary | ICD-10-CM | POA: Insufficient documentation

## 2024-05-01 LAB — CBC WITH DIFFERENTIAL (CANCER CENTER ONLY)
Abs Immature Granulocytes: 0 K/uL (ref 0.00–0.07)
Basophils Absolute: 0 K/uL (ref 0.0–0.1)
Basophils Relative: 0 %
Eosinophils Absolute: 0.1 K/uL (ref 0.0–0.5)
Eosinophils Relative: 2 %
HCT: 39.7 % (ref 36.0–46.0)
Hemoglobin: 12.7 g/dL (ref 12.0–15.0)
Immature Granulocytes: 0 %
Lymphocytes Relative: 34 %
Lymphs Abs: 1.1 K/uL (ref 0.7–4.0)
MCH: 23.5 pg — ABNORMAL LOW (ref 26.0–34.0)
MCHC: 32 g/dL (ref 30.0–36.0)
MCV: 73.5 fL — ABNORMAL LOW (ref 80.0–100.0)
Monocytes Absolute: 0.2 K/uL (ref 0.1–1.0)
Monocytes Relative: 6 %
Neutro Abs: 2 K/uL (ref 1.7–7.7)
Neutrophils Relative %: 58 %
Platelet Count: 238 K/uL (ref 150–400)
RBC: 5.4 MIL/uL — ABNORMAL HIGH (ref 3.87–5.11)
RDW: 15.5 % (ref 11.5–15.5)
WBC Count: 3.4 K/uL — ABNORMAL LOW (ref 4.0–10.5)
nRBC: 0 % (ref 0.0–0.2)

## 2024-05-01 LAB — IRON AND TIBC
Iron: 99 ug/dL (ref 28–170)
Saturation Ratios: 26 % (ref 10.4–31.8)
TIBC: 379 ug/dL (ref 250–450)
UIBC: 281 ug/dL

## 2024-05-01 LAB — VITAMIN B12: Vitamin B-12: 499 pg/mL (ref 180–914)

## 2024-05-01 LAB — FERRITIN: Ferritin: 92 ng/mL (ref 11–307)

## 2024-05-01 LAB — FOLATE: Folate: 14.5 ng/mL (ref >5.9)

## 2024-05-01 NOTE — Assessment & Plan Note (Signed)
 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 Normal iron level.

## 2024-05-01 NOTE — Assessment & Plan Note (Signed)
 Previous work up of leukopenia showed normal LDH, normal B12, folate, negative hepatitis, negative HIV. Negative M protein. 

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
## 2024-05-01 NOTE — Assessment & Plan Note (Signed)
 Recommend dermatology evaluation
